# Patient Record
Sex: Female | Born: 1960
Health system: Southern US, Community
[De-identification: ages and names within clinical notes are randomized; demographics above are authoritative.]

## PROBLEM LIST (undated history)

## (undated) ENCOUNTER — Emergency Department (HOSPITAL_BASED_OUTPATIENT_CLINIC_OR_DEPARTMENT_OTHER): Payer: Self-pay | Source: Home / Self Care

## (undated) DIAGNOSIS — J302 Other seasonal allergic rhinitis: Secondary | ICD-10-CM

## (undated) DIAGNOSIS — Z9049 Acquired absence of other specified parts of digestive tract: Secondary | ICD-10-CM

## (undated) DIAGNOSIS — R002 Palpitations: Secondary | ICD-10-CM

## (undated) DIAGNOSIS — M1990 Unspecified osteoarthritis, unspecified site: Secondary | ICD-10-CM

## (undated) DIAGNOSIS — J329 Chronic sinusitis, unspecified: Secondary | ICD-10-CM

## (undated) DIAGNOSIS — T7840XA Allergy, unspecified, initial encounter: Secondary | ICD-10-CM

## (undated) DIAGNOSIS — I10 Essential (primary) hypertension: Secondary | ICD-10-CM

## (undated) DIAGNOSIS — K219 Gastro-esophageal reflux disease without esophagitis: Secondary | ICD-10-CM

## (undated) DIAGNOSIS — G4730 Sleep apnea, unspecified: Secondary | ICD-10-CM

## (undated) DIAGNOSIS — G250 Essential tremor: Secondary | ICD-10-CM

## (undated) DIAGNOSIS — G629 Polyneuropathy, unspecified: Secondary | ICD-10-CM

## (undated) HISTORY — DX: Other seasonal allergic rhinitis: J30.2

## (undated) HISTORY — DX: Chronic sinusitis, unspecified: J32.9

## (undated) HISTORY — DX: Acquired absence of other specified parts of digestive tract: Z90.49

## (undated) HISTORY — DX: Unspecified osteoarthritis, unspecified site: M19.90

## (undated) HISTORY — DX: Polyneuropathy, unspecified: G62.9

## (undated) HISTORY — DX: Allergy, unspecified, initial encounter: T78.40XA

## (undated) HISTORY — DX: Sleep apnea, unspecified: G47.30

## (undated) HISTORY — DX: Essential tremor: G25.0

## (undated) HISTORY — DX: Palpitations: R00.2

## (undated) HISTORY — PX: HEEL SPUR SURGERY: SHX665

## (undated) HISTORY — PX: OTHER SURGICAL HISTORY: SHX169

---

## 2001-04-01 ENCOUNTER — Other Ambulatory Visit: Admission: RE | Admit: 2001-04-01 | Discharge: 2001-04-01 | Payer: Self-pay | Admitting: Obstetrics and Gynecology

## 2002-05-12 ENCOUNTER — Other Ambulatory Visit: Admission: RE | Admit: 2002-05-12 | Discharge: 2002-05-12 | Payer: Self-pay | Admitting: Obstetrics and Gynecology

## 2003-05-26 HISTORY — PX: CARPAL TUNNEL RELEASE: SHX101

## 2003-05-30 ENCOUNTER — Other Ambulatory Visit: Admission: RE | Admit: 2003-05-30 | Discharge: 2003-05-30 | Payer: Self-pay | Admitting: Obstetrics and Gynecology

## 2004-08-21 ENCOUNTER — Other Ambulatory Visit: Admission: RE | Admit: 2004-08-21 | Discharge: 2004-08-21 | Payer: Self-pay | Admitting: Obstetrics and Gynecology

## 2004-10-23 ENCOUNTER — Encounter: Admission: RE | Admit: 2004-10-23 | Discharge: 2004-10-23 | Payer: Self-pay | Admitting: Obstetrics and Gynecology

## 2006-05-25 HISTORY — PX: CHOLECYSTECTOMY: SHX55

## 2010-06-09 ENCOUNTER — Emergency Department (HOSPITAL_BASED_OUTPATIENT_CLINIC_OR_DEPARTMENT_OTHER)
Admission: EM | Admit: 2010-06-09 | Discharge: 2010-06-10 | Payer: Self-pay | Source: Home / Self Care | Admitting: Emergency Medicine

## 2010-06-15 ENCOUNTER — Encounter: Payer: Self-pay | Admitting: Obstetrics and Gynecology

## 2011-06-08 DIAGNOSIS — G629 Polyneuropathy, unspecified: Secondary | ICD-10-CM | POA: Insufficient documentation

## 2011-06-08 DIAGNOSIS — Z9049 Acquired absence of other specified parts of digestive tract: Secondary | ICD-10-CM | POA: Insufficient documentation

## 2012-11-10 ENCOUNTER — Encounter: Payer: Self-pay | Admitting: Internal Medicine

## 2012-12-28 ENCOUNTER — Emergency Department (HOSPITAL_BASED_OUTPATIENT_CLINIC_OR_DEPARTMENT_OTHER): Payer: Managed Care, Other (non HMO)

## 2012-12-28 ENCOUNTER — Encounter (HOSPITAL_BASED_OUTPATIENT_CLINIC_OR_DEPARTMENT_OTHER): Payer: Self-pay | Admitting: *Deleted

## 2012-12-28 ENCOUNTER — Emergency Department (HOSPITAL_BASED_OUTPATIENT_CLINIC_OR_DEPARTMENT_OTHER)
Admission: EM | Admit: 2012-12-28 | Discharge: 2012-12-28 | Disposition: A | Discharge status: Home / Self Care | Payer: Managed Care, Other (non HMO) | Attending: Emergency Medicine | Admitting: Emergency Medicine

## 2012-12-28 DIAGNOSIS — Z9089 Acquired absence of other organs: Secondary | ICD-10-CM | POA: Insufficient documentation

## 2012-12-28 DIAGNOSIS — K5289 Other specified noninfective gastroenteritis and colitis: Secondary | ICD-10-CM | POA: Insufficient documentation

## 2012-12-28 DIAGNOSIS — Z79899 Other long term (current) drug therapy: Secondary | ICD-10-CM | POA: Insufficient documentation

## 2012-12-28 DIAGNOSIS — R110 Nausea: Secondary | ICD-10-CM | POA: Insufficient documentation

## 2012-12-28 DIAGNOSIS — R630 Anorexia: Secondary | ICD-10-CM | POA: Insufficient documentation

## 2012-12-28 DIAGNOSIS — K529 Noninfective gastroenteritis and colitis, unspecified: Secondary | ICD-10-CM

## 2012-12-28 DIAGNOSIS — I10 Essential (primary) hypertension: Secondary | ICD-10-CM | POA: Insufficient documentation

## 2012-12-28 HISTORY — DX: Essential (primary) hypertension: I10

## 2012-12-28 LAB — URINALYSIS, ROUTINE W REFLEX MICROSCOPIC
Glucose, UA: NEGATIVE mg/dL
Hgb urine dipstick: NEGATIVE
Leukocytes, UA: NEGATIVE
Nitrite: NEGATIVE
Protein, ur: NEGATIVE mg/dL
Specific Gravity, Urine: 1.016 (ref 1.005–1.030)
pH: 7 (ref 5.0–8.0)

## 2012-12-28 LAB — CBC WITH DIFFERENTIAL/PLATELET
Basophils Relative: 0 % (ref 0–1)
Eosinophils Absolute: 0.2 10*3/uL (ref 0.0–0.7)
HCT: 37.4 % (ref 36.0–46.0)
MCV: 94.7 fL (ref 78.0–100.0)
Neutro Abs: 8.5 10*3/uL — ABNORMAL HIGH (ref 1.7–7.7)
Platelets: 261 10*3/uL (ref 150–400)
RBC: 3.95 MIL/uL (ref 3.87–5.11)
RDW: 12.9 % (ref 11.5–15.5)
WBC: 12.7 10*3/uL — ABNORMAL HIGH (ref 4.0–10.5)

## 2012-12-28 LAB — COMPREHENSIVE METABOLIC PANEL
Albumin: 3.7 g/dL (ref 3.5–5.2)
Alkaline Phosphatase: 93 U/L (ref 39–117)
BUN: 23 mg/dL (ref 6–23)
CO2: 26 mEq/L (ref 19–32)
Chloride: 99 mEq/L (ref 96–112)
GFR calc Af Amer: 60 mL/min — ABNORMAL LOW (ref >90)
GFR calc non Af Amer: 52 mL/min — ABNORMAL LOW (ref >90)
Glucose, Bld: 86 mg/dL (ref 70–99)
Potassium: 3.9 mEq/L (ref 3.5–5.1)
Sodium: 136 mEq/L (ref 135–145)
Total Bilirubin: 0.4 mg/dL (ref 0.3–1.2)

## 2012-12-28 MED ORDER — CIPROFLOXACIN HCL 500 MG PO TABS
500.0000 mg | ORAL_TABLET | Freq: Once | ORAL | Status: AC
  Administered 2012-12-28: 500 mg via ORAL
  Filled 2012-12-28: qty 1

## 2012-12-28 MED ORDER — METRONIDAZOLE 500 MG PO TABS
500.0000 mg | ORAL_TABLET | Freq: Once | ORAL | Status: AC
  Administered 2012-12-28: 500 mg via ORAL
  Filled 2012-12-28: qty 1

## 2012-12-28 MED ORDER — HYDROCODONE-ACETAMINOPHEN 5-325 MG PO TABS: 1.0000 | ORAL_TABLET | Freq: Four times a day (QID) | ORAL | Status: DC | PRN

## 2012-12-28 MED ORDER — ONDANSETRON HCL 4 MG/2ML IJ SOLN
4.0000 mg | Freq: Once | INTRAMUSCULAR | Status: AC
  Administered 2012-12-28: 4 mg via INTRAVENOUS
  Filled 2012-12-28: qty 2

## 2012-12-28 MED ORDER — METRONIDAZOLE 500 MG PO TABS: 500.0000 mg | ORAL_TABLET | Freq: Two times a day (BID) | ORAL | Status: DC

## 2012-12-28 MED ORDER — IOHEXOL 300 MG/ML  SOLN
100.0000 mL | Freq: Once | INTRAMUSCULAR | Status: AC | PRN
  Administered 2012-12-28: 100 mL via INTRAVENOUS

## 2012-12-28 MED ORDER — IOHEXOL 300 MG/ML  SOLN
50.0000 mL | Freq: Once | INTRAMUSCULAR | Status: AC | PRN
  Administered 2012-12-28: 50 mL via ORAL

## 2012-12-28 MED ORDER — MORPHINE SULFATE 4 MG/ML IJ SOLN
4.0000 mg | Freq: Once | INTRAMUSCULAR | Status: AC
  Administered 2012-12-28: 4 mg via INTRAVENOUS
  Filled 2012-12-28: qty 1

## 2012-12-28 MED ORDER — CIPROFLOXACIN HCL 500 MG PO TABS: 500.0000 mg | ORAL_TABLET | Freq: Two times a day (BID) | ORAL | Status: DC

## 2012-12-28 NOTE — ED Provider Notes (Signed)
CSN: [id number]     Arrival date & time 12/28/12  1620 History     First MD Initiated Contact with Patient 12/28/12 1655     Chief Complaint  Patient presents with  . Abdominal Pain   (Consider location/radiation/quality/duration/timing/severity/associated sxs/prior Treatment) Patient is a 52 y.o. female presenting with abdominal pain. The history is provided by the patient.  Abdominal Pain Pain location:  RLQ Pain quality: aching and sharp   Pain radiates to:  Does not radiate Pain severity:  Moderate Onset quality:  Sudden Timing:  Intermittent Progression:  Waxing and waning Chronicity:  New Context: not eating and not recent travel   Relieved by:  Nothing Worsened by:  Movement Ineffective treatments:  None tried Associated symptoms: anorexia and nausea   Associated symptoms: no chills, no diarrhea, no dysuria, no fever, no hematuria, no vaginal bleeding, no vaginal discharge and no vomiting     Past Medical History  Diagnosis Date  . Hypertension    Past Surgical History  Procedure Laterality Date  . Carpal tunnel release    . Cholecystectomy     No family history on file. History  Substance Use Topics  . Smoking status: Never Smoker   . Smokeless tobacco: Not on file  . Alcohol Use: No   OB History   Grav Para Term Preterm Abortions TAB SAB Ect Mult Living                 Review of Systems  Constitutional: Negative for fever and chills.  Gastrointestinal: Positive for nausea, abdominal pain and anorexia. Negative for vomiting and diarrhea.  Genitourinary: Negative for dysuria, hematuria, vaginal bleeding and vaginal discharge.  All other systems reviewed and are negative.    Allergies  Mobic and Niacin and related  Home Medications   Current Outpatient Rx  Name  Route  Sig  Dispense  Refill  . lisinopril (PRINIVIL,ZESTRIL) 10 MG tablet   Oral   Take 10 mg by mouth daily.         . phentermine 15 MG capsule   Oral   Take 15 mg by mouth  every morning.          BP 129/77  Pulse 97  Temp(Src) 97.9 F (36.6 C) (Oral)  Resp 18  Wt 293 lb (132.904 kg)  SpO2 99% Physical Exam  Nursing note and vitals reviewed. Constitutional: She is oriented to person, place, and time. She appears well-developed and well-nourished. No distress.  HENT:  Head: Normocephalic and atraumatic.  Eyes: EOM are normal. Pupils are equal, round, and reactive to light.  Neck: Normal range of motion. Neck supple.  Cardiovascular: Normal rate and regular rhythm.  Exam reveals no friction rub.   No murmur heard. Pulmonary/Chest: Effort normal and breath sounds normal. No respiratory distress. She has no wheezes. She has no rales.  Abdominal: Soft. She exhibits no distension. There is tenderness (RLQ, mild periumbilical). There is guarding (RLQ). There is no rebound.  Musculoskeletal: Normal range of motion. She exhibits no edema.  Neurological: She is alert and oriented to person, place, and time.  Skin: She is not diaphoretic.    ED Course   Procedures (including critical care time)  Labs Reviewed  CBC WITH DIFFERENTIAL - Abnormal; Notable for the following:    WBC 12.7 (*)    Neutro Abs 8.5 (*)    Monocytes Absolute 1.2 (*)    All other components within normal limits  URINALYSIS, ROUTINE W REFLEX MICROSCOPIC  COMPREHENSIVE METABOLIC PANEL  Ct Abdomen Pelvis W Contrast  12/28/2012   *RADIOLOGY REPORT*  Clinical Data: Right lower quadrant pain, nausea  CT ABDOMEN AND PELVIS WITH CONTRAST  Technique:  Multidetector CT imaging of the abdomen and pelvis was performed following the standard protocol during bolus administration of intravenous contrast.  Contrast: 50mL OMNIPAQUE IOHEXOL 300 MG/ML  SOLN, OMNIPAQUE IOHEXOL 300 MG/ML  SOLN  Comparison: None.  Findings: Lung bases clear.  Normal heart size.  No pericardial or pleural effusion.  Abdomen:  Prior cholecystectomy noted.  Liver, biliary system, pancreas, spleen, adrenal glands, and  kidneys are within normal limits for age and demonstrate no acute process.  Negative for bowel obstruction, dilatation, ileus, or free air.  No abdominal free fluid, fluid collection, abscess, hemorrhage, adenopathy.  In the right mid abdomen, there is a focal area of circumferential bowel wall thickening of the right colon with adjacent lateral pericolonic stranding / edema, suspect mild focal colitis.  It would be difficult to exclude a colonic mural lesion given this appearance.  Inferior to this, the terminal ileum and appendix are normal.  Pelvis:  Bladder, uterus and adnexa are unremarkable.  No pelvic free fluid, fluid collection, hemorrhage, abscess, adenopathy, inguinal abnormality, or significant hernia.  No acute distal bowel process.  No acute osseous finding.  IMPRESSION: Focal right colonic wall thickening with adjacent pericolonic inflammatory stranding / edema compatible with mild right colitis. Difficult to completely exclude underlying mural lesion.  Recommend follow-up non emergent colonoscopy in this age group.  Prior cholecystectomy.  Normal appendix.   Original Report Authenticated By: Judie Petit. Shick, M.D.   1. Colitis     MDM   52yo female presents with RLQ pain. Began today. Waxing and waning. Sharp. No previous abdominal pain like this. Associated nausea, dry heaving. No vomiting or diarrhea.  On exam, RLQ pain with some guarding. Mild periumbilical pain. Concern for possible appendicitis. Will CT. CT negative for appendicitis, however she does have a focal right colitis. Cannot rule out a transmural lesion. Since she is focal colitis,  I will treat with Cipro Flagyl. patient given Cipro Flagyl and pain medicine. Instructed to followup with her primary care physician. Stable for discharge.   Films reviewed by me.   Dagmar Hait, MD 12/28/12 [phone]

## 2012-12-28 NOTE — ED Notes (Signed)
RLQ pain since this morning.

## 2012-12-29 LAB — GC/CHLAMYDIA PROBE AMP
CT Probe RNA: NEGATIVE
GC Probe RNA: NEGATIVE

## 2013-01-27 ENCOUNTER — Ambulatory Visit (AMBULATORY_SURGERY_CENTER): Payer: Self-pay | Admitting: *Deleted

## 2013-01-27 VITALS — Ht 67.0 in | Wt 290.6 lb

## 2013-01-27 DIAGNOSIS — Z1211 Encounter for screening for malignant neoplasm of colon: Secondary | ICD-10-CM

## 2013-01-27 NOTE — Progress Notes (Signed)
Denies allergies to eggs or soy products. Denies complications with sedation or anesthesia. 

## 2013-01-30 ENCOUNTER — Encounter: Payer: Self-pay | Admitting: Internal Medicine

## 2013-02-08 ENCOUNTER — Telehealth: Payer: Self-pay | Admitting: Internal Medicine

## 2013-02-08 NOTE — Telephone Encounter (Signed)
Spoke with patient, she states she is being treated for strep. Is taking amoxicillin. Explained she could take antibiotics the day of procedure. She understands.

## 2013-02-10 ENCOUNTER — Encounter: Payer: Self-pay | Admitting: Internal Medicine

## 2013-02-10 ENCOUNTER — Ambulatory Visit (AMBULATORY_SURGERY_CENTER): Payer: Managed Care, Other (non HMO) | Admitting: Internal Medicine

## 2013-02-10 VITALS — BP 114/68 | HR 76 | Temp 96.9°F | Resp 18 | Ht 67.0 in | Wt 290.0 lb

## 2013-02-10 DIAGNOSIS — Z1211 Encounter for screening for malignant neoplasm of colon: Secondary | ICD-10-CM

## 2013-02-10 DIAGNOSIS — K633 Ulcer of intestine: Secondary | ICD-10-CM

## 2013-02-10 DIAGNOSIS — R933 Abnormal findings on diagnostic imaging of other parts of digestive tract: Secondary | ICD-10-CM

## 2013-02-10 MED ORDER — SODIUM CHLORIDE 0.9 % IV SOLN: 500.0000 mL | INTRAVENOUS | Status: DC

## 2013-02-10 NOTE — Op Note (Addendum)
[facility] [address] [place], [address]   COLONOSCOPY PROCEDURE REPORT  PATIENT: Ashley [name], Ashley [name]  MR#: [id number] BIRTHDATE: [birth year] , 52  yrs. old GENDER: Female ENDOSCOPIST: Iva Boop, MD, [facility] REFERRED BY:   Betsey Holiday, MD PROCEDURE DATE:  02/10/2013 PROCEDURE:   Colonoscopy with biopsy First Screening Colonoscopy - Avg.  risk and is 50 yrs.  old or older Yes.  Prior Negative Screening - Now for repeat screening. N/A  History of Adenoma - Now for follow-up colonoscopy & has been > or = to 3 yrs.  N/A  Polyps Removed Today? No.  Recommend repeat exam, <10 yrs? No. ASA CLASS:   Class II INDICATIONS:average risk screening and first colonoscopy. MEDICATIONS: propofol (Diprivan) 200mg  IV, MAC sedation, administered by CRNA, and These medications were titrated to patient response per physician's verbal order  DESCRIPTION OF PROCEDURE:   After the risks benefits and alternatives of the procedure were thoroughly explained, informed consent was obtained.  A digital rectal exam revealed no abnormalities of the rectum.   The LB [id number]  endoscope was introduced through the anus and advanced to the terminal ileum which was intubated for a short distance. No adverse events experienced.   The quality of the prep was excellent using Suprep The instrument was then slowly withdrawn as the colon was fully examined.      COLON FINDINGS: A medium sized ulcer was found in the ascending colon.  Multiple biopsies were performed using cold forceps.   The colon mucosa was otherwise normal.   The mucosa appeared normal in the terminal ileum.  Retroflexed views revealed no abnormalities. The time to cecum=2 minutes 12 seconds.  Withdrawal time=12 minutes 00 seconds.  The scope was withdrawn and the procedure completed. COMPLICATIONS: There were no complications.  ENDOSCOPIC IMPRESSION: 1.   Ulcer in the ascending colon; multiple biopsies were  performed using cold forceps 2.   The colon mucosa was otherwise normal - excellent prep 3.   Normal mucosa in the terminal ileum  RECOMMENDATIONS: 1.  Timing of repeat colonoscopy will be determined by pathology findings. 2.  Routine hemoccults no longer indicated.   eSigned:  Iva Boop, MD, [facility] 02/10/2013 10:06 AM Revised: 02/10/2013 10:06 AM  cc: Betsey Holiday, MD and The Patient

## 2013-02-10 NOTE — Patient Instructions (Addendum)
There was one area that looked like a healing ulcer - I took biopsies and will let you know. Otherwise ok.  I appreciate the opportunity to care for you. Iva Boop, MD, FACG   YOU HAD AN ENDOSCOPIC PROCEDURE TODAY AT THE [facility]: Refer to the procedure report that was given to you for any specific questions about what was found during the examination.  If the procedure report does not answer your questions, please call your gastroenterologist to clarify.  If you requested that your care partner not be given the details of your procedure findings, then the procedure report has been included in a sealed envelope for you to review at your convenience later.  YOU SHOULD EXPECT: Some feelings of bloating in the abdomen. Passage of more gas than usual.  Walking can help get rid of the air that was put into your GI tract during the procedure and reduce the bloating. If you had a lower endoscopy (such as a colonoscopy or flexible sigmoidoscopy) you may notice spotting of blood in your stool or on the toilet paper. If you underwent a bowel prep for your procedure, then you may not have a normal bowel movement for a few days.  DIET: Your first meal following the procedure should be a light meal and then it is ok to progress to your normal diet.  A half-sandwich or bowl of soup is an example of a good first meal.  Heavy or fried foods are harder to digest and may make you feel nauseous or bloated.  Likewise meals heavy in dairy and vegetables can cause extra gas to form and this can also increase the bloating.  Drink plenty of fluids but you should avoid alcoholic beverages for 24 hours.  ACTIVITY: Your care partner should take you home directly after the procedure.  You should plan to take it easy, moving slowly for the rest of the day.  You can resume normal activity the day after the procedure however you should NOT DRIVE or use heavy machinery for 24 hours (because of the sedation  medicines used during the test).    SYMPTOMS TO REPORT IMMEDIATELY: A gastroenterologist can be reached at any hour.  During normal business hours, 8:30 AM to 5:00 PM Monday through Friday, call [phone].  After hours and on weekends, please call the GI answering service at [phone] who will take a message and have the physician on call contact you.   Following lower endoscopy (colonoscopy or flexible sigmoidoscopy):  Excessive amounts of blood in the stool  Significant tenderness or worsening of abdominal pains  Swelling of the abdomen that is new, acute  Fever of 100F or higher    FOLLOW UP: If any biopsies were taken you will be contacted by phone or by letter within the next 1-3 weeks.  Call your gastroenterologist if you have not heard about the biopsies in 3 weeks.  Our staff will call the home number listed on your records the next business day following your procedure to check on you and address any questions or concerns that you may have at that time regarding the information given to you following your procedure. This is a courtesy call and so if there is no answer at the home number and we have not heard from you through the emergency physician on call, we will assume that you have returned to your regular daily activities without incident.  SIGNATURES/CONFIDENTIALITY: You and/or your care partner have signed paperwork which  will be entered into your electronic medical record.  These signatures attest to the fact that that the information above on your After Visit Summary has been reviewed and is understood.  Full responsibility of the confidentiality of this discharge information lies with you and/or your care-partner.

## 2013-02-10 NOTE — Progress Notes (Signed)
Patient did not have preoperative order for IV antibiotic SSI prophylaxis. (G8918)  Patient did not experience any of the following events: a burn prior to discharge; a fall within the facility; wrong site/side/patient/procedure/implant event; or a hospital transfer or hospital admission upon discharge from the facility. (G8907)  

## 2013-02-10 NOTE — Progress Notes (Signed)
Procedure ends, to recovery, report given and VSS. 

## 2013-02-10 NOTE — Progress Notes (Signed)
Called to room to assist during endoscopic procedure.  Patient ID and intended procedure confirmed with present staff. Received instructions for my participation in the procedure from the performing physician.  

## 2013-02-13 ENCOUNTER — Telehealth: Payer: Self-pay | Admitting: *Deleted

## 2013-02-13 NOTE — Telephone Encounter (Signed)
  Follow up Call-  Call back number 02/10/2013  Post procedure Call Back phone  # [phone]  Permission to leave phone message Yes     Patient questions:  Left message to call us if necessary.

## 2013-02-17 ENCOUNTER — Encounter: Payer: Self-pay | Admitting: Internal Medicine

## 2013-02-17 NOTE — Progress Notes (Signed)
Quick Note:  Ulcer - inflammation Repeat colon 2024 ______

## 2013-03-22 ENCOUNTER — Encounter: Payer: Self-pay | Admitting: Neurology

## 2013-03-23 ENCOUNTER — Encounter: Payer: Self-pay | Admitting: Neurology

## 2013-03-23 ENCOUNTER — Ambulatory Visit (INDEPENDENT_AMBULATORY_CARE_PROVIDER_SITE_OTHER): Payer: Managed Care, Other (non HMO) | Admitting: Neurology

## 2013-03-23 VITALS — BP 108/65 | HR 58 | Ht 68.0 in | Wt 283.0 lb

## 2013-03-23 DIAGNOSIS — G250 Essential tremor: Secondary | ICD-10-CM

## 2013-03-23 NOTE — Progress Notes (Signed)
[facility]    Provider:  Dr Hosie Poisson Referring Provider: Marguerita Merles Primary Care Physician:  No primary provider on file.  CC: Tremor  HPI:  Ashley [name] is a 52 y.o. female here as a referral from Dr. Jacqulyn Bath for possible essential tremor.   Tremor started years ago, saw neurologist in [facility] and then moved to this area. Was told it was essential tremor at that time, suggested she try a medication which patient reports with a seizure medication, patient refused at that time. Started in left hand, now in both but worse in left. Notes it the most when holding things, more of a postural/action tremor. Worse with stress. Has minimal tremor at rest. More of a nuisance. Not affecting daily life the No bradykinesias, no gait instability, no muscle cramping. No difficulty sleeping, some talking in her sleep. Has limited sense of smell. No change in handwriting. No known thyroid abnormalities, has been checked at PCP. Started Inderal 60mg  ER, this week. Tolerating well, has not noted any benefit. No noted effect with EtOH but drinks infrequently.   No family history of tremor a neurodegenerative process  Review of Systems: Out of a complete 14 system review, the patient complains of only the following symptoms, and all other reviewed systems are negative. Other for tremor swelling in legs fatigue easy  History   Social History  . Marital Status: Married    Spouse Name: Colon Branch     Number of Children: 3  . Years of Education: 12+   Occupational History  .     Social History Main Topics  . Smoking status: Never Smoker   . Smokeless tobacco: Never Used  . Alcohol Use: No  . Drug Use: No  . Sexual Activity: Not on file   Other Topics Concern  . Not on file   Social History Narrative   Patient is Married to [place].    Patient works full-time.   Patient has 3 children.    Patient has some college.           Family History  Problem Relation Age of  Onset  . Colon cancer Neg Hx   . Esophageal cancer Neg Hx   . Rectal cancer Neg Hx   . Stomach cancer Neg Hx     Past Medical History  Diagnosis Date  . Hypertension   . Seasonal allergies   . Essential tremor   . Palpitations   . History of cholecystectomy   . Neuropathy     Past Surgical History  Procedure Laterality Date  . Carpal tunnel release Right 2005    left 2011  . Cesarean section  1989  . Heel spur surgery Left   . Cholecystectomy  2008  . History of neuroplasty decompression median nerve at carpal tunnel      Current Outpatient Prescriptions  Medication Sig Dispense Refill  . CALCIUM CARBONATE-VITAMIN D PO Take by mouth daily.      . hydrOXYzine (ATARAX/VISTARIL) 50 MG tablet Take 50 mg by mouth 3 (three) times daily as needed for itching.      Marland Kitchen lisinopril-hydrochlorothiazide (PRINZIDE,ZESTORETIC) 20-12.5 MG per tablet Take 1 tablet by mouth daily.      . Multiple Vitamins-Minerals (MULTIPLE VITAMINS/WOMENS) tablet Take 1 tablet by mouth daily.      . phentermine 15 MG capsule Take 30 mg by mouth every morning.       . propranolol ER (INDERAL LA) 60 MG 24 hr capsule Take 60 mg  by mouth daily.       No current facility-administered medications for this visit.    Allergies as of 03/23/2013 - Review Complete 03/23/2013  Allergen Reaction Noted  . Chocolate  03/22/2013  . Mobic [meloxicam]  12/28/2012  . Neomycin  03/22/2013  . Neosporin [neomycin-bacitracin zn-polymyx]  03/22/2013  . Niacin and related  12/28/2012  . Strawberry  03/22/2013  . Erythromycin Rash 01/27/2013    Vitals: BP 108/65  Pulse 58  Ht 5\' 8"  (1.727 m)  Wt 283 lb (128.368 kg)  BMI 43.04 kg/m2 Last Weight:  Wt Readings from Last 1 Encounters:  03/23/13 283 lb (128.368 kg)   Last Height:   Ht Readings from Last 1 Encounters:  03/23/13 5\' 8"  (1.727 m)     Physical exam: Exam: Gen: NAD, conversant Eyes: anicteric sclerae, moist conjunctivae HENT: Atraumatic, oropharynx  clear Neck: Trachea midline; supple,  Lungs: CTA, no wheezing, rales, rhonic                          CV: RRR, no MRG Abdomen: Soft, non-tender;  Extremities: No peripheral edema  Skin: Normal temperature, no rash,  Psych: Appropriate affect, pleasant  Neuro: MS: AA&Ox3, appropriately interactive, normal affect   Speech: fluent w/o paraphasic error  Memory: good recent and remote recall  CN: PERRL, EOMI no nystagmus, no ptosis, sensation intact to LT V1-V3 bilat, face symmetric, no weakness, hearing grossly intact, palate elevates symmetrically, shoulder shrug 5/5 bilat,  tongue protrudes midline, no fasiculations noted.  Motor: normal bulk and tone Strength: 5/5  In all extremities  Coord: Noted postural action and mild intention tremor of bilateral hands left greater than right. No bradykinesia no muscle rigidity no cogwheeling   Reflexes: symmetrical, bilat downgoing toes  Sens: LT intact in all extremities  Gait: posture, stance, stride and arm-swing normal. Tandem gait intact. Able to walk on heels and toes. Romberg absent.   Assessment:  After physical and neurologic examination, review of laboratory studies, imaging, neurophysiology testing and pre-existing records, assessment will be reviewed on the problem list.  Plan:  Treatment plan and additional workup will be reviewed under Problem List.  52 year old woman presenting for initial evaluation of tremor, characterized by bilateral hand postural and action tremor. Physical exam otherwise unremarkable. Based on history and clinical symptoms is most consistent with a diagnosis of essential tremor. Patient currently on Inderal extended release 60 mg daily. Appears to be tolerated well, will give this medication time to take effect. Patient instructed to call back in one month if no symptomatic improvement, at that time would consider increasing dose as tolerated. If patient unable to tolerate higher dose would consider  switch to primidone. Followup in one year or as needed

## 2013-03-23 NOTE — Patient Instructions (Signed)
Overall you are doing fairly well but I do want to suggest a few things today:   Remember to drink plenty of fluid, eat healthy meals and do not skip any meals. Try to eat protein with a every meal and eat a healthy snack such as fruit or nuts in between meals. Try to keep a regular sleep-wake schedule and try to exercise daily, particularly in the form of walking, 20-30 minutes a day, if you can.   As far as your medications are concerned, I would like to suggest continuing on the Inderal ER 60mg  daily. Give this medication a few weeks to start working. If you are not noticing benefit in a month please call and we can adjust the medication.   I would like to see you back in 12 months, sooner if we need to. Please call us with any interim questions, concerns, problems, updates or refill requests.   My clinical assistant and will answer any of your questions and relay your messages to me and also relay most of my messages to you.   Our phone number is [phone]. We also have an after hours call service for urgent matters and there is a physician on-call for urgent questions. For any emergencies you know to call 911 or go to the nearest emergency room

## 2013-04-19 ENCOUNTER — Telehealth: Payer: Self-pay | Admitting: Neurology

## 2013-04-19 DIAGNOSIS — G250 Essential tremor: Secondary | ICD-10-CM

## 2013-04-19 MED ORDER — PROPRANOLOL HCL ER 80 MG PO CP24: 80.0000 mg | ORAL_CAPSULE | Freq: Every day | ORAL | Status: DC

## 2013-04-19 NOTE — Telephone Encounter (Signed)
As she discussed with Dr. Hosie Poisson during her clinic visit I will go ahead and increase her Inderal LA from 60 mg to 80 mg once daily. Please tell her that she can start taking the 80 mg strength in lieu of the 60 mg strength. Rx done and sent to pharmacy on file. Please advise her that she can have side effects including sedation, lethargy, low blood pressure, lightheadedness and low pulse rate. However, most likely, the medication increase is not big enough to cause her any sinister side effects. Please make patient aware that it can take a few days for the medication to kick in.

## 2013-04-19 NOTE — Telephone Encounter (Signed)
I called patient and reviewed Dr. Johny Sax' note. I let her know Rx was sent to [name]. Patient thanked me.

## 2013-04-19 NOTE — Telephone Encounter (Signed)
Spoke with patient and she said that tremor medicine prescribe by pcp is propranolol-ER,60mg , is not helping with tremors, would like something in it's place. She will be [phone] until 2:30 then afterwards please call on cell @ [phone].

## 2013-07-10 ENCOUNTER — Other Ambulatory Visit: Payer: Self-pay

## 2013-07-10 DIAGNOSIS — G250 Essential tremor: Secondary | ICD-10-CM

## 2013-07-10 MED ORDER — PROPRANOLOL HCL ER 80 MG PO CP24: 80.0000 mg | ORAL_CAPSULE | Freq: Every day | ORAL | Status: DC

## 2013-07-10 NOTE — Telephone Encounter (Signed)
Pharmacy requests 90 day Rx  

## 2013-09-16 ENCOUNTER — Encounter (HOSPITAL_BASED_OUTPATIENT_CLINIC_OR_DEPARTMENT_OTHER): Payer: Self-pay | Admitting: Emergency Medicine

## 2013-09-16 ENCOUNTER — Emergency Department (HOSPITAL_BASED_OUTPATIENT_CLINIC_OR_DEPARTMENT_OTHER)
Admission: EM | Admit: 2013-09-16 | Discharge: 2013-09-16 | Disposition: A | Discharge status: Home / Self Care | Payer: Managed Care, Other (non HMO) | Attending: Emergency Medicine | Admitting: Emergency Medicine

## 2013-09-16 DIAGNOSIS — I10 Essential (primary) hypertension: Secondary | ICD-10-CM | POA: Insufficient documentation

## 2013-09-16 DIAGNOSIS — Z8669 Personal history of other diseases of the nervous system and sense organs: Secondary | ICD-10-CM | POA: Insufficient documentation

## 2013-09-16 DIAGNOSIS — M79602 Pain in left arm: Secondary | ICD-10-CM

## 2013-09-16 DIAGNOSIS — Z79899 Other long term (current) drug therapy: Secondary | ICD-10-CM | POA: Insufficient documentation

## 2013-09-16 DIAGNOSIS — M79609 Pain in unspecified limb: Secondary | ICD-10-CM | POA: Insufficient documentation

## 2013-09-16 MED ORDER — PREDNISONE 20 MG PO TABS: 60.0000 mg | ORAL_TABLET | Freq: Every day | ORAL | Status: DC

## 2013-09-16 MED ORDER — OXYCODONE-ACETAMINOPHEN 5-325 MG PO TABS: 1.0000 | ORAL_TABLET | ORAL | Status: DC | PRN

## 2013-09-16 MED ORDER — OXYCODONE-ACETAMINOPHEN 5-325 MG PO TABS
1.0000 | ORAL_TABLET | Freq: Once | ORAL | Status: AC
  Administered 2013-09-16: 1 via ORAL
  Filled 2013-09-16: qty 1

## 2013-09-16 MED ORDER — PREDNISONE 50 MG PO TABS
60.0000 mg | ORAL_TABLET | Freq: Once | ORAL | Status: AC
  Administered 2013-09-16: 60 mg via ORAL
  Filled 2013-09-16 (×2): qty 1

## 2013-09-16 NOTE — ED Provider Notes (Signed)
Medical screening examination/treatment/procedure(s) were performed by non-physician practitioner and as supervising physician I was immediately available for consultation/collaboration.   EKG Interpretation None        Drewey Begue, MD 09/16/13 1941 

## 2013-09-16 NOTE — Discharge Instructions (Signed)
KEEP YOUR ORTHOPEDIC APPOINTMENT FOR 09/27/13. TAKE MEDICATIONS AS PRESCRIBED. RETURN HERE WITH ANY HIGH FEVER, SEVERE PAIN OR NEW CONCERN.

## 2013-09-16 NOTE — ED Provider Notes (Signed)
CSN: [id number]     Arrival date & time 09/16/13  1600 History   First MD Initiated Contact with Patient 09/16/13 1611     Chief Complaint  Patient presents with  . Arm Pain     (Consider location/radiation/quality/duration/timing/severity/associated sxs/prior Treatment) Patient is a 53 y.o. female presenting with arm pain. The history is provided by the patient. No language interpreter was used.  Arm Pain This is a new problem. The current episode started 1 to 4 weeks ago. The problem occurs constantly. Pertinent negatives include no chills, fever, neck pain or numbness. Associated symptoms comments: She complains of left arm pain that started 2 weeks ago at the elbow without known injury or strain. The pain progressed to involve the left shoulder and extend into the forearm. She denies swelling or redness. No fever. No neck pain..    Past Medical History  Diagnosis Date  . Hypertension   . Seasonal allergies   . Essential tremor   . Palpitations   . History of cholecystectomy   . Neuropathy    Past Surgical History  Procedure Laterality Date  . Carpal tunnel release Right 2005    left 2011  . Cesarean section  1989  . Heel spur surgery Left   . Cholecystectomy  2008  . History of neuroplasty decompression median nerve at carpal tunnel     Family History  Problem Relation Age of Onset  . Colon cancer Neg Hx   . Esophageal cancer Neg Hx   . Rectal cancer Neg Hx   . Stomach cancer Neg Hx    History  Substance Use Topics  . Smoking status: Never Smoker   . Smokeless tobacco: Never Used  . Alcohol Use: No   OB History   Grav Para Term Preterm Abortions TAB SAB Ect Mult Living                 Review of Systems  Constitutional: Negative for fever and chills.  Musculoskeletal: Negative for neck pain.       See HPI.  Skin: Negative.  Negative for color change and wound.  Neurological: Negative.  Negative for numbness.      Allergies  Chocolate; Mobic; Neomycin;  Neosporin; Niacin and related; Strawberry; and Erythromycin  Home Medications   Prior to Admission medications   Medication Sig Start Date End Date Taking? Authorizing Provider  CALCIUM CARBONATE-VITAMIN D PO Take by mouth daily.   Yes Historical Provider, MD  lisinopril-hydrochlorothiazide (PRINZIDE,ZESTORETIC) 20-12.5 MG per tablet Take 1 tablet by mouth daily.   Yes Historical Provider, MD  Multiple Vitamins-Minerals (MULTIPLE VITAMINS/WOMENS) tablet Take 1 tablet by mouth daily.   Yes Historical Provider, MD  propranolol ER (INDERAL LA) 80 MG 24 hr capsule Take 1 capsule (80 mg total) by mouth daily. 07/10/13  Yes Omelia BlackwaterPeter Justin Sumner, DO  hydrOXYzine (ATARAX/VISTARIL) 50 MG tablet Take 50 mg by mouth 3 (three) times daily as needed for itching.    Historical Provider, MD  phentermine 15 MG capsule Take 30 mg by mouth every morning.     Historical Provider, MD   BP 130/91  Pulse 66  Temp(Src) 97.8 F (36.6 C) (Oral)  Resp 24  Ht 5\' 7"  (1.702 m)  Wt 286 lb (129.729 kg)  BMI 44.78 kg/m2  SpO2 100% Physical Exam  Constitutional: She is oriented to person, place, and time. She appears well-developed and well-nourished.  Neck: Normal range of motion.  Cardiovascular: Intact distal pulses.   Pulmonary/Chest: Effort normal.  Musculoskeletal:  Left upper extremity is unremarkable in appearance without redness, swelling or deformity. There is no tenderness. No midline or paracervical neck tenderness. FROM of left upper extremity without strength deficit.  Neurological: She is alert and oriented to person, place, and time.  Skin: Skin is warm and dry.    ED Course  Procedures (including critical care time) Labs Review Labs Reviewed - No data to display  Imaging Review No results found.   EKG Interpretation None      MDM   Final diagnoses:  None    1. Left arm pain  DDx: muscular elbow pain, ?bursitis as she is morbidly obese and swelling may be difficult to see vs  cervical radiculopathy. Prednisone indicated. She has orthopedic appointment scheduled.     Arnoldo HookerShari A Dequavious Harshberger, PA-C 09/16/13 1705

## 2013-09-16 NOTE — ED Notes (Signed)
Reports left arm pain x 3 weeks, gradually worsening- states fell and hit elbow during snow

## 2013-09-16 NOTE — ED Notes (Signed)
Son is driving patient home.

## 2014-01-03 ENCOUNTER — Other Ambulatory Visit: Payer: Self-pay | Admitting: Neurology

## 2014-01-03 NOTE — Telephone Encounter (Signed)
Per phone note from 11/26

## 2014-01-08 ENCOUNTER — Encounter: Payer: Self-pay | Admitting: Neurology

## 2014-01-22 ENCOUNTER — Telehealth: Payer: Self-pay | Admitting: Neurology

## 2014-01-22 NOTE — Telephone Encounter (Signed)
Please let her know this can be related to irritation of her peripheral nerves. I would suggest initially checking some blood work. She can have this done now or wait until she follows up in October, it is up to her.

## 2014-01-22 NOTE — Telephone Encounter (Signed)
Left message that the doctor suggest having blood work initially which she can have done now or at her follow up appointment in October.  Asked her to return call if she would like it set up now.

## 2014-01-22 NOTE — Telephone Encounter (Signed)
Patient called to confirm her change of appointment for 01-21-14.   She also relayed that her feet are "falling asleep" and would like to know what can be done for that issue.  She is currently being seen for essential tremor.

## 2014-02-01 ENCOUNTER — Telehealth: Payer: Self-pay | Admitting: Neurology

## 2014-02-01 NOTE — Telephone Encounter (Signed)
Patient called back and stated she would have blood work drawn when comes in for October appointment.

## 2014-02-01 NOTE — Telephone Encounter (Signed)
Noted.  Patient can have blood work done when she comes for appointment, per Dr. Hosie Poisson.

## 2014-03-23 ENCOUNTER — Ambulatory Visit: Payer: Managed Care, Other (non HMO) | Admitting: Neurology

## 2014-03-30 ENCOUNTER — Ambulatory Visit: Payer: Managed Care, Other (non HMO) | Admitting: Neurology

## 2014-05-01 ENCOUNTER — Ambulatory Visit: Payer: Managed Care, Other (non HMO) | Admitting: Neurology

## 2014-05-12 ENCOUNTER — Other Ambulatory Visit: Payer: Self-pay | Admitting: Neurology

## 2014-05-14 NOTE — Telephone Encounter (Signed)
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  Has appt in Jan.  This is current dose per phone note on 04/19/13

## 2014-05-16 ENCOUNTER — Other Ambulatory Visit: Payer: Self-pay | Admitting: Neurology

## 2014-05-17 NOTE — Telephone Encounter (Signed)
Rx was sent on 12/21.  I called the pharmacy and spoke with Jonny RuizJohn.  He verified they have Rx form the 21st

## 2014-05-21 ENCOUNTER — Other Ambulatory Visit: Payer: Self-pay

## 2014-05-21 MED ORDER — PROPRANOLOL HCL ER 80 MG PO CP24: ORAL_CAPSULE | ORAL | Status: DC

## 2014-05-21 NOTE — Telephone Encounter (Signed)
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  Has appt scheduled.  Patient's ins requires 90 day Rx.

## 2014-06-06 ENCOUNTER — Ambulatory Visit: Payer: Managed Care, Other (non HMO) | Admitting: Neurology

## 2014-06-14 ENCOUNTER — Ambulatory Visit: Payer: Managed Care, Other (non HMO) | Admitting: Neurology

## 2014-07-02 ENCOUNTER — Ambulatory Visit (INDEPENDENT_AMBULATORY_CARE_PROVIDER_SITE_OTHER): Payer: 59 | Admitting: Neurology

## 2014-07-02 ENCOUNTER — Encounter: Payer: Self-pay | Admitting: Neurology

## 2014-07-02 VITALS — BP 138/80 | Ht 67.0 in | Wt 297.0 lb

## 2014-07-02 DIAGNOSIS — G609 Hereditary and idiopathic neuropathy, unspecified: Secondary | ICD-10-CM

## 2014-07-02 DIAGNOSIS — G5601 Carpal tunnel syndrome, right upper limb: Secondary | ICD-10-CM

## 2014-07-02 DIAGNOSIS — G5600 Carpal tunnel syndrome, unspecified upper limb: Secondary | ICD-10-CM | POA: Insufficient documentation

## 2014-07-02 DIAGNOSIS — G250 Essential tremor: Secondary | ICD-10-CM

## 2014-07-02 DIAGNOSIS — G5603 Carpal tunnel syndrome, bilateral upper limbs: Secondary | ICD-10-CM

## 2014-07-02 DIAGNOSIS — G5602 Carpal tunnel syndrome, left upper limb: Secondary | ICD-10-CM

## 2014-07-02 MED ORDER — GABAPENTIN 300 MG PO CAPS: 300.0000 mg | ORAL_CAPSULE | Freq: Every evening | ORAL | Status: DC | PRN

## 2014-07-02 MED ORDER — PROPRANOLOL HCL ER 80 MG PO CP24: ORAL_CAPSULE | ORAL | Status: DC

## 2014-07-02 NOTE — Progress Notes (Signed)
[facility]    Provider:  Dr Lucia Gaskins Referring Provider: No ref. provider found Primary Care Physician:  Loyce Dys, PA-C  CC:  Essential Tremor  HPI:  Ashley [name] is a 54 y.o. female here as a follow up for essential tremor. She is feeling better. Tremor improved on the inderal . Not bothering her, not interfering with any daily functions. She says her feet have been numb on occassion. She was sitting eating lunch and her right foot got numb to the ankles. Got better with stretching. Has happened 3-4x in the right leg and at one point was persistent for several months. Not worse at night. No low back pain. Some foot cramps at night. She has pain in digit 2 in the hands. Previous CTS.  Visit with Dr. Hosie Poisson 03/23/2013: Tremor started years ago, saw neurologist in [facility] and then moved to this area. Was told it was essential tremor at that time, suggested she try a medication which patient reports with a seizure medication, patient refused at that time. Started in left hand, now in both but worse in left. Notes it the most when holding things, more of a postural/action tremor. Worse with stress. Has minimal tremor at rest. More of a nuisance. Not affecting daily life the No bradykinesias, no gait instability, no muscle cramping. No difficulty sleeping, some talking in her sleep. Has limited sense of smell. No change in handwriting. No known thyroid abnormalities, has been checked at PCP. Started Inderal  ER, this week. Tolerating well, has not noted any benefit. No noted effect with EtOH but drinks infrequently.   No family history of tremor a neurodegenerative process  Review of Systems: Patient complains of symptoms per HPI as well as the following symptoms: leg swelling, numbness, tremors. Pertinent negatives per HPI. All others negative.   History   Social History  . Marital Status: Married    Spouse Name: Colon Branch     Number of Children: 3  . Years of  Education: 12+   Occupational History  .     Social History Main Topics  . Smoking status: Never Smoker   . Smokeless tobacco: Never Used  . Alcohol Use: No  . Drug Use: No  . Sexual Activity: Not on file   Other Topics Concern  . Not on file   Social History Narrative   Patient is Married to [place].    Patient works full-time.   Patient has 3 children.    Patient has some college.           Family History  Problem Relation Age of Onset  . Colon cancer Neg Hx   . Esophageal cancer Neg Hx   . Rectal cancer Neg Hx   . Stomach cancer Neg Hx     Past Medical History  Diagnosis Date  . Hypertension   . Seasonal allergies   . Essential tremor   . Palpitations   . History of cholecystectomy   . Neuropathy     Past Surgical History  Procedure Laterality Date  . Carpal tunnel release Right 2005    left 2011  . Cesarean section  1989  . Heel spur surgery Left   . Cholecystectomy  2008  . History of neuroplasty decompression median nerve at carpal tunnel      Current Outpatient Prescriptions  Medication Sig Dispense Refill  . CALCIUM CARBONATE-VITAMIN D PO Take by mouth daily.    . hydrOXYzine (ATARAX/VISTARIL) 50 MG tablet Take 50 mg by  mouth 3 (three) times daily as needed for itching.    Marland Kitchen lisinopril-hydrochlorothiazide (PRINZIDE,ZESTORETIC) 20-12.5 MG per tablet Take 1 tablet by mouth daily.    . Multiple Vitamins-Minerals (MULTIPLE VITAMINS/WOMENS) tablet Take 1 tablet by mouth daily.    Marland Kitchen oxyCODONE-acetaminophen (PERCOCET/ROXICET) 5-325 MG per tablet Take 1-2 tablets by mouth every 4 (four) hours as needed for severe pain. 15 tablet 0  . phentermine 15 MG capsule Take 30 mg by mouth every morning.     . predniSONE (DELTASONE) 20 MG tablet Take 3 tablets (60 mg total) by mouth daily. 9 tablet 0  . propranolol ER (INDERAL LA) 80 MG 24 hr capsule TAKE 1 CAPSULE (80 MG TOTAL) BY MOUTH DAILY. 90 capsule 0   No current facility-administered medications for this  visit.    Allergies as of 07/02/2014 - Review Complete 07/02/2014  Allergen Reaction Noted  . Chocolate  03/22/2013  . Mobic [meloxicam]  12/28/2012  . Neomycin  03/22/2013  . Neosporin [neomycin-bacitracin zn-polymyx]  03/22/2013  . Niacin and related  12/28/2012  . Strawberry  03/22/2013  . Erythromycin Rash 01/27/2013    Vitals: BP 138/80 mmHg  Ht  (1.702 m)  Wt 297 lb (134.718 kg)  BMI 46.51 kg/m2 Last Weight:  Wt Readings from Last 1 Encounters:  07/02/14 297 lb (134.718 kg)   Last Height:   Ht Readings from Last 1 Encounters:  07/02/14  (1.702 m)    Sensation intact to pp, prop,vibra Postural tremor left > right   Physical exam: Exam: Gen: NAD, conversant, well nourised, obese, well groomed    Eyes: Conjunctivae clear without exudates or hemorrhage  Neuro: Detailed Neurologic Exam  Speech:    Speech is normal; fluent and spontaneous with normal comprehension.  Cognition:    The patient is oriented to person, place, and time;     recent and remote memory intact;     language fluent;     normal attention, concentration,     fund of knowledge Cranial Nerves:    The pupils are equal, round, and reactive to light. Visual fields are full to finger confrontation. Extraocular movements are intact. Trigeminal sensation is intact and the muscles of mastication are normal. The face is symmetric. The palate elevates in the midline. Hearing intact. Voice is normal. Shoulder shrug is normal. The tongue has normal motion without fasciculations.   Coordination:    No dysmetria   Motor Observation:    Postural action and mild intention tremor right > left  Tone:    Normal muscle tone.    Posture:    Posture is normal.    Strength:    Strength is V/V in the upper and lower limbs.      Sensation: intact to pin prick, vibration and proprioception distally in the feet.     Reflex Exam:  DTR's:    Deep tendon reflexes achilles 2+ bilat  Toes:    The  toes are downgoing bilaterally.   Clonus:    Clonus is absent.   Assessment/Plan:  54 year old woman presenting for follow up evaluation of tremor, characterized by bilateral hand postural and action tremor. Physical exam otherwise unremarkable. Based on history and clinical symptoms is most consistent with a diagnosis of essential tremor. Patient currently on Inderal extended release 80 mg daily, will continue.  Today she also complains of numbness in the feet. Will screen for common causes of neuropathy. Also advised that she wear wrist braces at night and conservative measures for pain  and tingling in digits 1-3 of hands possibly from CTS.    Naomie DeanAntonia Ahern, MD  [facility] [address] [place], [place] [id number]  Phone [phone] Fax [phone]  A total of 30 minutes was spent face-to-face with this patient. Over half this time was spent on counseling patient on the CTS,Essential tremor and neuropathy diagnosis and different diagnostic and therapeutic options available.

## 2014-07-02 NOTE — Patient Instructions (Addendum)
Overall you are doing fairly well but I do want to suggest a few things today:   Remember to drink plenty of fluid, eat healthy meals and do not skip any meals. Try to eat protein with a every meal and eat a healthy snack such as fruit or nuts in between meals. Try to keep a regular sleep-wake schedule and try to exercise daily, particularly in the form of walking, 20-30 minutes a day, if you can.   As far as your medications are concerned, I would like to suggest: Labwork today, Neurontin 300mg  as needed at night for cramping.   I would like to see you back yearly, sooner if we need to. Please call us with any interim questions, concerns, problems, updates or refill requests.   Please also call us for any test results so we can go over those with you on the phone.    My clinical assistant and will answer any of your questions and relay your messages to me and also relay most of my messages to you.   Our phone number is [phone]. We also have an after hours call service for urgent matters and there is a physician on-call for urgent questions. For any emergencies you know to call 911 or go to the nearest emergency room

## 2014-07-04 ENCOUNTER — Telehealth: Payer: Self-pay | Admitting: *Deleted

## 2014-07-04 LAB — COMPREHENSIVE METABOLIC PANEL
ALK PHOS: 91 IU/L (ref 39–117)
ALT: 11 IU/L (ref 0–32)
AST: 16 IU/L (ref 0–40)
Albumin/Globulin Ratio: 1.6 (ref 1.1–2.5)
Albumin: 3.9 g/dL (ref 3.5–5.5)
BILIRUBIN TOTAL: 0.3 mg/dL (ref 0.0–1.2)
BUN/Creatinine Ratio: 15 (ref 9–23)
BUN: 17 mg/dL (ref 6–24)
CHLORIDE: 102 mmol/L (ref 97–108)
CO2: 24 mmol/L (ref 18–29)
Calcium: 9 mg/dL (ref 8.7–10.2)
Creatinine, Ser: 1.11 mg/dL — ABNORMAL HIGH (ref 0.57–1.00)
GFR calc Af Amer: 66 mL/min/{1.73_m2} (ref >59)
GFR calc non Af Amer: 57 mL/min/{1.73_m2} — ABNORMAL LOW (ref >59)
Globulin, Total: 2.4 g/dL (ref 1.5–4.5)
Glucose: 87 mg/dL (ref 65–99)
Potassium: 4.4 mmol/L (ref 3.5–5.2)
SODIUM: 140 mmol/L (ref 134–144)
Total Protein: 6.3 g/dL (ref 6.0–8.5)

## 2014-07-04 LAB — IFE AND PE, SERUM
ALPHA 1: 0.2 g/dL (ref 0.1–0.4)
ALPHA2 GLOB SERPL ELPH-MCNC: 0.8 g/dL (ref 0.4–1.2)
Albumin SerPl Elph-Mcnc: 3.5 g/dL (ref 3.2–5.6)
Albumin/Glob SerPl: 1.3 (ref 0.7–2.0)
B-GLOBULIN SERPL ELPH-MCNC: 0.9 g/dL (ref 0.6–1.3)
Gamma Glob SerPl Elph-Mcnc: 0.9 g/dL (ref 0.5–1.6)
Globulin, Total: 2.8 g/dL (ref 2.0–4.5)
IGM (IMMUNOGLOBULIN M), SRM: 188 mg/dL (ref 40–230)
IgA/Immunoglobulin A, Serum: 183 mg/dL (ref 91–414)
IgG (Immunoglobin G), Serum: 873 mg/dL (ref 700–1600)

## 2014-07-04 LAB — HIV ANTIBODY (ROUTINE TESTING W REFLEX): HIV Screen 4th Generation wRfx: NONREACTIVE

## 2014-07-04 LAB — HEPATITIS C ANTIBODY: Hep C Virus Ab: <0.1 s/co ratio (ref 0.0–0.9)

## 2014-07-04 LAB — B12 AND FOLATE PANEL
FOLATE: 10.6 ng/mL (ref >3.0)
Vitamin B-12: 327 pg/mL (ref 211–946)

## 2014-07-04 LAB — ANA W/REFLEX: ANA: NEGATIVE

## 2014-07-04 LAB — TSH: TSH: 1.75 u[IU]/mL (ref 0.450–4.500)

## 2014-07-04 LAB — RPR: RPR: NONREACTIVE

## 2014-07-04 NOTE — Telephone Encounter (Signed)
-----   Message from Anson FretAntonia B Ahern, MD sent at 07/03/2014  5:43 PM EST ----- Let patient know her labs were normal except slightly elevated creatinine, she can follow up with her pcp for this. Thanks.

## 2014-07-04 NOTE — Telephone Encounter (Signed)
Tried calling patient. Was not able to leave a message because the call ended.

## 2014-07-05 ENCOUNTER — Telehealth: Payer: Self-pay | Admitting: *Deleted

## 2014-07-05 NOTE — Telephone Encounter (Signed)
Patient is returning a call and wants to discuss her abnormal lab. Please call.

## 2014-07-05 NOTE — Telephone Encounter (Signed)
Patient returning call. Please call patient at [phone] that is her work number.

## 2014-07-05 NOTE — Telephone Encounter (Signed)
Left a voicemail for the patient regarding normal labs results except high creatinine level. Advised patient to follow up with her PCP per Dr. Lucia GaskinsAhern.

## 2014-07-06 NOTE — Telephone Encounter (Signed)
Spoke to patient. Creatinine is 1.1, just mildly elevated. A year ago it was 1.2 so it is actually improved. Not really concerning but did inform patient. No need to make a special appointment with pcp, she can follow up on it at her next physical.

## 2014-08-18 ENCOUNTER — Other Ambulatory Visit: Payer: Self-pay | Admitting: Neurology

## 2014-08-31 ENCOUNTER — Other Ambulatory Visit: Payer: Self-pay

## 2014-08-31 MED ORDER — GABAPENTIN 300 MG PO CAPS: 300.0000 mg | ORAL_CAPSULE | Freq: Every evening | ORAL | Status: DC | PRN

## 2014-08-31 NOTE — Telephone Encounter (Signed)
Pharmacy requested 90 day Rx  

## 2014-10-12 ENCOUNTER — Emergency Department (HOSPITAL_BASED_OUTPATIENT_CLINIC_OR_DEPARTMENT_OTHER)
Admission: EM | Admit: 2014-10-12 | Discharge: 2014-10-13 | Disposition: A | Discharge status: Home / Self Care | Payer: 59 | Attending: Emergency Medicine | Admitting: Emergency Medicine

## 2014-10-12 ENCOUNTER — Encounter (HOSPITAL_BASED_OUTPATIENT_CLINIC_OR_DEPARTMENT_OTHER): Payer: Self-pay | Admitting: Emergency Medicine

## 2014-10-12 DIAGNOSIS — Y92009 Unspecified place in unspecified non-institutional (private) residence as the place of occurrence of the external cause: Secondary | ICD-10-CM | POA: Insufficient documentation

## 2014-10-12 DIAGNOSIS — G629 Polyneuropathy, unspecified: Secondary | ICD-10-CM | POA: Diagnosis not present

## 2014-10-12 DIAGNOSIS — Z7951 Long term (current) use of inhaled steroids: Secondary | ICD-10-CM | POA: Diagnosis not present

## 2014-10-12 DIAGNOSIS — Y998 Other external cause status: Secondary | ICD-10-CM | POA: Diagnosis not present

## 2014-10-12 DIAGNOSIS — S99912A Unspecified injury of left ankle, initial encounter: Secondary | ICD-10-CM | POA: Diagnosis present

## 2014-10-12 DIAGNOSIS — W108XXA Fall (on) (from) other stairs and steps, initial encounter: Secondary | ICD-10-CM | POA: Diagnosis not present

## 2014-10-12 DIAGNOSIS — Z79899 Other long term (current) drug therapy: Secondary | ICD-10-CM | POA: Insufficient documentation

## 2014-10-12 DIAGNOSIS — Y9389 Activity, other specified: Secondary | ICD-10-CM | POA: Diagnosis not present

## 2014-10-12 DIAGNOSIS — I10 Essential (primary) hypertension: Secondary | ICD-10-CM | POA: Insufficient documentation

## 2014-10-12 DIAGNOSIS — Z9049 Acquired absence of other specified parts of digestive tract: Secondary | ICD-10-CM | POA: Insufficient documentation

## 2014-10-12 DIAGNOSIS — S8252XA Displaced fracture of medial malleolus of left tibia, initial encounter for closed fracture: Secondary | ICD-10-CM

## 2014-10-12 NOTE — ED Notes (Signed)
Fell down steps at home.  Twisted left ankle

## 2014-10-13 ENCOUNTER — Emergency Department (HOSPITAL_BASED_OUTPATIENT_CLINIC_OR_DEPARTMENT_OTHER): Payer: 59

## 2014-10-13 MED ORDER — HYDROCODONE-ACETAMINOPHEN 5-325 MG PO TABS: 1.0000 | ORAL_TABLET | Freq: Four times a day (QID) | ORAL | Status: DC | PRN

## 2014-10-13 MED ORDER — HYDROCODONE-ACETAMINOPHEN 5-325 MG PO TABS
2.0000 | ORAL_TABLET | Freq: Once | ORAL | Status: AC
  Administered 2014-10-13: 2 via ORAL
  Filled 2014-10-13: qty 2

## 2014-10-13 NOTE — ED Notes (Signed)
I fit and adjusted crutches for patient. I then completed crutch training and had patient ambulate from bed to hall and back to bed without assistance. Patient was nervous about crutch usage. I brought patient by wheelchair to car. I had to assist patient to car as she was not sure of herself or her own strength.

## 2014-10-13 NOTE — Discharge Instructions (Signed)
Ice for 20 minutes every 2 hours while awake for the next 2 days. Keep your leg elevated.  Use crutches and weightbearing as tolerated.  Follow-up with your primary Dr. if not improving in the next week.  Hydrocodone as prescribed as needed for pain.   Ankle Fracture A fracture is a break in a bone. The ankle joint is made up of three bones. These include the lower (distal)sections of your lower leg bones, called the tibia and fibula, along with a bone in your foot, called the talus. Depending on how bad the break is and if more than one ankle joint bone is broken, a cast or splint is used to protect and keep your injured bone from moving while it heals. Sometimes, surgery is required to help the fracture heal properly.  There are two general types of fractures:  Stable fracture. This includes a single fracture line through one bone, with no injury to ankle ligaments. A fracture of the talus that does not have any displacement (movement of the bone on either side of the fracture line) is also stable.  Unstable fracture. This includes more than one fracture line through one or more bones in the ankle joint. It also includes fractures that have displacement of the bone on either side of the fracture line. CAUSES  A direct blow to the ankle.   Quickly and severely twisting your ankle.  Trauma, such as a car accident or falling from a significant height. RISK FACTORS You may be at a higher risk of ankle fracture if:  You have certain medical conditions.  You are involved in high-impact sports.  You are involved in a high-impact car accident. SIGNS AND SYMPTOMS   Tender and swollen ankle.  Bruising around the injured ankle.  Pain on movement of the ankle.  Difficulty walking or putting weight on the ankle.  A cold foot below the site of the ankle injury. This can occur if the blood vessels passing through your injured ankle were also damaged.  Numbness in the foot below the  site of the ankle injury. DIAGNOSIS  An ankle fracture is usually diagnosed with a physical exam and X-rays. A CT scan may also be required for complex fractures. TREATMENT  Stable fractures are treated with a cast or splint and using crutches to avoid putting weight on your injured ankle. This is followed by an ankle strengthening program. Some patients require a special type of cast, depending on other medical problems they may have. Unstable fractures require surgery to ensure the bones heal properly. Your health care provider will tell you what type of fracture you have and the best treatment for your condition. HOME CARE INSTRUCTIONS   Review correct crutch use with your health care provider and use your crutches as directed. Safe use of crutches is extremely important. Misuse of crutches can cause you to fall or cause injury to nerves in your hands or armpits.  Do not put weight or pressure on the injured ankle until directed by your health care provider.  To lessen the swelling, keep the injured leg elevated while sitting or lying down.  Apply ice to the injured area:  Put ice in a plastic bag.  Place a towel between your cast and the bag.  Leave the ice on for 20 minutes, 2-3 times a day.  If you have a plaster or fiberglass cast:  Do not try to scratch the skin under the cast with any objects. This can increase your risk  of skin infection.  Check the skin around the cast every day. You may put lotion on any red or sore areas.  Keep your cast dry and clean.  If you have a plaster splint:  Wear the splint as directed.  You may loosen the elastic around the splint if your toes become numb, tingle, or turn cold or blue.  Do not put pressure on any part of your cast or splint; it may break. Rest your cast only on a pillow the first 24 hours until it is fully hardened.  Your cast or splint can be protected during bathing with a plastic bag sealed to your skin with medical  tape. Do not lower the cast or splint into water.  Take medicines as directed by your health care provider. Only take over-the-counter or prescription medicines for pain, discomfort, or fever as directed by your health care provider.  Do not drive a vehicle until your health care provider specifically tells you it is safe to do so.  If your health care provider has given you a follow-up appointment, it is very important to keep that appointment. Not keeping the appointment could result in a chronic or permanent injury, pain, and disability. If you have any problem keeping the appointment, call the facility for assistance. SEEK MEDICAL CARE IF: You develop increased swelling or discomfort. SEEK IMMEDIATE MEDICAL CARE IF:   Your cast gets damaged or breaks.  You have continued severe pain.  You develop new pain or swelling after the cast was put on.  Your skin or toenails below the injury turn blue or gray.  Your skin or toenails below the injury feel cold, numb, or have loss of sensitivity to touch.  There is a bad smell or pus draining from under the cast. MAKE SURE YOU:   Understand these instructions.  Will watch your condition.  Will get help right away if you are not doing well or get worse. Document Released: 05/08/2000 Document Revised: 05/16/2013 Document Reviewed: 12/08/2012 [facility] Patient Information 2015 [employer] [place]. This information is not intended to replace advice given to you by your health care provider. Make sure you discuss any questions you have with your health care provider.

## 2014-10-13 NOTE — ED Provider Notes (Signed)
CSN: [id number]     Arrival date & time 10/12/14  2341 History   First MD Initiated Contact with Patient 10/13/14 0020     Chief Complaint  Patient presents with  . Fall     (Consider location/radiation/quality/duration/timing/severity/associated sxs/prior Treatment) HPI Comments: Patient is a 54 year old female who presents with complaints of a left ankle injury. She apparently fell down several steps while at home and twisted her ankle.  Patient is a 54 y.o. female presenting with fall. The history is provided by the patient.  Fall This is a new problem. The current episode started 1 to 2 hours ago. The problem occurs constantly. The problem has not changed since onset.Pertinent negatives include no chest pain and no abdominal pain. The symptoms are aggravated by walking. Nothing relieves the symptoms. She has tried nothing for the symptoms. The treatment provided no relief.    Past Medical History  Diagnosis Date  . Hypertension   . Seasonal allergies   . Essential tremor   . Palpitations   . History of cholecystectomy   . Neuropathy    Past Surgical History  Procedure Laterality Date  . Carpal tunnel release Right 2005    left 2011  . Cesarean section  1989  . Heel spur surgery Left   . Cholecystectomy  2008  . History of neuroplasty decompression median nerve at carpal tunnel     Family History  Problem Relation Age of Onset  . Colon cancer Neg Hx   . Esophageal cancer Neg Hx   . Rectal cancer Neg Hx   . Stomach cancer Neg Hx    History  Substance Use Topics  . Smoking status: Never Smoker   . Smokeless tobacco: Never Used  . Alcohol Use: No   OB History    No data available     Review of Systems  Cardiovascular: Negative for chest pain.  Gastrointestinal: Negative for abdominal pain.  All other systems reviewed and are negative.     Allergies  Chocolate; Mobic; Neomycin; Neosporin; Niacin and related; Strawberry; and Erythromycin  Home Medications    Prior to Admission medications   Medication Sig Start Date End Date Taking? Authorizing Provider  ranitidine (ZANTAC) 150 MG tablet Take 150 mg by mouth 2 (two) times daily.   Yes Historical Provider, MD  CALCIUM CARBONATE-VITAMIN D PO Take by mouth daily.    Historical Provider, MD  fluticasone (FLONASE) 50 MCG/ACT nasal spray Place 1 spray into the nose. 06/19/14 06/19/15  Historical Provider, MD  gabapentin (NEURONTIN) 300 MG capsule Take 1 capsule (300 mg total) by mouth at bedtime as needed. 08/31/14   Anson Fret, MD  hydrOXYzine (ATARAX/VISTARIL) 50 MG tablet Take 50 mg by mouth 3 (three) times daily as needed for itching.    Historical Provider, MD  lisinopril-hydrochlorothiazide (PRINZIDE,ZESTORETIC) 20-12.5 MG per tablet Take 1 tablet by mouth daily.    Historical Provider, MD  Multiple Vitamins-Minerals (MULTIPLE VITAMINS/WOMENS) tablet Take 1 tablet by mouth daily.    Historical Provider, MD  propranolol ER (INDERAL LA) 80 MG 24 hr capsule TAKE 1 CAPSULE (80 MG TOTAL) BY MOUTH DAILY. 07/02/14   Anson Fret, MD   BP 118/63 mmHg  Pulse 86  Temp(Src) 98.3 F (36.8 C) (Oral)  Resp 20  Ht  (1.702 m)  Wt 302 lb (136.986 kg)  BMI 47.29 kg/m2  SpO2 100% Physical Exam  Constitutional: She is oriented to person, place, and time. She appears well-developed and well-nourished. No distress.  HENT:  Head: Normocephalic and atraumatic.  Neck: Normal range of motion. Neck supple.  Musculoskeletal:  There is swelling to the lateral aspect of the left ankle over the lateral malleolus. Distal pulses, motor, and sensory are intact. There is no medial malleolus tenderness. There is no fifth metatarsal tenderness or proximal fibular tenderness.  Neurological: She is alert and oriented to person, place, and time.  Skin: Skin is warm and dry. She is not diaphoretic.  Nursing note and vitals reviewed.   ED Course  Procedures (including critical care time) Labs Review Labs Reviewed -  No data to display  Imaging Review No results found.   EKG Interpretation None      MDM   Final diagnoses:  None    X-rays reveal possible avulsion fractures of the medial malleolus and talus. This will be treated with rest, ice, crutches, pain medication, and when necessary follow-up with her primary doctor.    Geoffery Lyonsouglas Jahn Franchini, MD 10/13/14 [phone]

## 2014-11-02 ENCOUNTER — Other Ambulatory Visit: Payer: Self-pay

## 2014-11-02 DIAGNOSIS — Z01818 Encounter for other preprocedural examination: Secondary | ICD-10-CM

## 2014-12-13 ENCOUNTER — Encounter: Payer: 59 | Attending: General Surgery | Admitting: Dietician

## 2014-12-13 ENCOUNTER — Encounter: Payer: Self-pay | Admitting: Dietician

## 2014-12-13 DIAGNOSIS — Z6841 Body Mass Index (BMI) 40.0 and over, adult: Secondary | ICD-10-CM | POA: Diagnosis not present

## 2014-12-13 DIAGNOSIS — Z713 Dietary counseling and surveillance: Secondary | ICD-10-CM | POA: Diagnosis not present

## 2014-12-13 NOTE — Progress Notes (Signed)
  Pre-Op Assessment Visit:  Pre-Operative Sleeve Gastrectomy Surgery  Medical Nutrition Therapy:  Appt start time: 325   End time:  410.  Patient was seen on 12/13/2014 for Pre-Operative Nutrition Assessment. Assessment and letter of approval faxed to [facility] Surgery Bariatric Surgery Program coordinator on 12/13/2014.   Preferred Learning Style:   No preference indicated   Learning Readiness:   Ready  Handouts given during visit include:  Pre-Op Goals Bariatric Surgery Protein Shakes   During the appointment today the following Pre-Op Goals were reviewed with the patient: Maintain or lose weight as instructed by your surgeon Make healthy food choices Begin to limit portion sizes Limited concentrated sugars and fried foods Keep fat/sugar in the single digits per serving on   food labels Practice CHEWING your food  (aim for 30 chews per bite or until applesauce consistency) Practice not drinking 15 minutes before, during, and 30 minutes after each meal/snack Avoid all carbonated beverages  Avoid/limit caffeinated beverages  Avoid all sugar-sweetened beverages Consume 3 meals per day; eat every 3-5 hours Make a list of non-food related activities Aim for 64-100 ounces of FLUID daily  Aim for at least 60-80 grams of PROTEIN daily Look for a liquid protein source that contain ?15 g protein and ?5 g carbohydrate  (ex: shakes, drinks, shots)  Patient-Centered Goals: -Better self esteem -Being proud of what she sees in the mirror -Ride in an airplane without seatbelt extender -Amusement park rides   Demonstrated degree of understanding via:  Teach Back  Teaching Method Utilized:  Visual Auditory Hands on  Barriers to learning/adherence to lifestyle change: none  Patient to call the [facility] to enroll in Pre-Op and Post-Op Nutrition Education when surgery date is scheduled.

## 2014-12-13 NOTE — Patient Instructions (Signed)

## 2015-01-10 ENCOUNTER — Ambulatory Visit (HOSPITAL_COMMUNITY)
Admission: RE | Admit: 2015-01-10 | Discharge: 2015-01-10 | Disposition: A | Discharge status: Home / Self Care | Payer: 59 | Source: Ambulatory Visit | Attending: General Surgery | Admitting: General Surgery

## 2015-01-10 ENCOUNTER — Other Ambulatory Visit: Payer: Self-pay

## 2015-01-10 DIAGNOSIS — K449 Diaphragmatic hernia without obstruction or gangrene: Secondary | ICD-10-CM | POA: Diagnosis not present

## 2015-01-10 DIAGNOSIS — Z01818 Encounter for other preprocedural examination: Secondary | ICD-10-CM | POA: Diagnosis not present

## 2015-03-04 ENCOUNTER — Encounter: Payer: 59 | Attending: General Surgery

## 2015-03-04 DIAGNOSIS — Z713 Dietary counseling and surveillance: Secondary | ICD-10-CM | POA: Diagnosis not present

## 2015-03-04 DIAGNOSIS — Z6841 Body Mass Index (BMI) 40.0 and over, adult: Secondary | ICD-10-CM | POA: Diagnosis not present

## 2015-03-05 NOTE — Progress Notes (Signed)
  Pre-Operative Nutrition Class:  Appt start time: 0254   End time:  1830.  Patient was seen on 03/04/15 for Pre-Operative Bariatric Surgery Education at the [facility].   Surgery date: 04/01/15 Surgery type: Sleeve gastrectomy Start weight at [facility]: 299 lbs on 12/13/14 Weight today: 292 lbs  TANITA  BODY COMP RESULTS  03/04/15   BMI (kg/m^2) 45.7   Fat Mass (lbs) 164   Fat Free Mass (lbs) 128   Total Body Water (lbs) 93.5   Samples given per MNT protocol. Patient educated on appropriate usage: Celebrate calcium citrate chew (berry - qty 1) Lot #: [id number] Exp: 07/2016  Premier protein shake (chocolate - qty 1) Lot #: [id number] Exp: 08/2015  Unjury protein powder (chicken soup - qty 1) Lot #: [id number] Exp: 11/2015  PB2 (chocolate - qty 1) Lot #: [id number] Exp: 03/2015  The following the learning objectives were met by the patient during this course:  Identify Pre-Op Dietary Goals and will begin 2 weeks pre-operatively  Identify appropriate sources of fluids and proteins   State protein recommendations and appropriate sources pre and post-operatively  Identify Post-Operative Dietary Goals and will follow for 2 weeks post-operatively  Identify appropriate multivitamin and calcium sources  Describe the need for physical activity post-operatively and will follow MD recommendations  State when to call healthcare provider regarding medication questions or post-operative complications  Handouts given during class include:  Pre-Op Bariatric Surgery Diet Handout  Protein Shake Handout  Post-Op Bariatric Surgery Nutrition Handout  [facility] Program Information Flyer  Support Group Information Flyer  [facility] Bariatric Supplements Price List  Follow-Up Plan: Patient will follow-up at [facility] 2 weeks post operatively for diet advancement per MD.

## 2015-03-29 ENCOUNTER — Other Ambulatory Visit: Payer: Self-pay | Admitting: General Surgery

## 2015-04-02 ENCOUNTER — Ambulatory Visit: Payer: 59

## 2015-04-03 NOTE — Patient Instructions (Signed)
Filbert BertholdLynn D Belay  04/03/2015   Your procedure is scheduled on: 04/09/15    [facility] 3rd floor to  Short Stay Center at    0900 AM.  Call this number if you have problems the morning of surgery [phone]   Remember: ONLY 1 PERSON MAY GO WITH YOU TO SHORT STAY TO GET  READY MORNING OF YOUR SURGERY.  Do not eat food or drink liquids :After Midnight.     Take these medicines the morning of surgery with A SIP OF WATER: Flonase nasal spray, Inderal LA ( Propanolol), Zantac                                 You may not have any metal on your body including hair pins and              piercings  Do not wear jewelry, make-up, lotions, powders or perfumes, deodorant             Do not wear nail polish.  Do not shave  48 hours prior to surgery.               Do not bring valuables to the hospital. [facility] IS NOT             RESPONSIBLE   FOR VALUABLES.  Contacts, dentures or bridgework may not be worn into surgery.  Leave suitcase in the car. After surgery it may be brought to your room.         Special Instructions: coughing and deep breathing exercises, leg exercises               Please read over the following fact sheets you were given: _____________________________________________________________________             [facility] Pre[facility] Before surgery, you can play an important role.  Because skin is not sterile, your skin needs to be as free of germs as possible.  You can reduce the number of germs on your skin by washing with CHG (chlorahexidine gluconate) soap before surgery.  CHG is an antiseptic cleaner which kills germs and bonds with the skin to continue killing germs even after washing. Please DO NOT use if you have an allergy to CHG or antibacterial soaps.  If your skin becomes reddened/irritated stop using the CHG and inform your nurse when you arrive at Short Stay. Do not shave  (including legs and underarms) for at least 48 hours prior to the first CHG shower.  You may shave your face/neck. Please follow these instructions carefully:  1.  Shower with CHG Soap the night before surgery and the  morning of Surgery.  2.  If you choose to wash your hair, wash your hair first as usual with your  normal  shampoo.  3.  After you shampoo, rinse your hair and body thoroughly to remove the  shampoo.                           4.  Use CHG as you would any other liquid soap.  You can apply chg directly  to the skin and wash  Gently with a scrungie or clean washcloth.  5.  Apply the CHG Soap to your body ONLY FROM THE NECK DOWN.   Do not use on face/ open                           Wound or open sores. Avoid contact with eyes, ears mouth and genitals (private parts).                       Wash face,  Genitals (private parts) with your normal soap.             6.  Wash thoroughly, paying special attention to the area where your surgery  will be performed.  7.  Thoroughly rinse your body with warm water from the neck down.  8.  DO NOT shower/wash with your normal soap after using and rinsing off  the CHG Soap.                9.  Pat yourself dry with a clean towel.            10.  Wear clean pajamas.            11.  Place clean sheets on your bed the night of your first shower and do not  sleep with pets. Day of Surgery : Do not apply any lotions/deodorants the morning of surgery.  Please wear clean clothes to the hospital/surgery center.  FAILURE TO FOLLOW THESE INSTRUCTIONS MAY RESULT IN THE CANCELLATION OF YOUR SURGERY PATIENT SIGNATURE_________________________________  NURSE SIGNATURE__________________________________  ________________________________________________________________________

## 2015-04-04 ENCOUNTER — Encounter (HOSPITAL_COMMUNITY)
Admission: RE | Admit: 2015-04-04 | Discharge: 2015-04-04 | Disposition: A | Discharge status: Home / Self Care | Payer: 59 | Source: Ambulatory Visit | Attending: General Surgery | Admitting: General Surgery

## 2015-04-04 ENCOUNTER — Encounter (HOSPITAL_COMMUNITY): Payer: Self-pay

## 2015-04-04 ENCOUNTER — Encounter (INDEPENDENT_AMBULATORY_CARE_PROVIDER_SITE_OTHER): Payer: Self-pay

## 2015-04-04 DIAGNOSIS — Z01818 Encounter for other preprocedural examination: Secondary | ICD-10-CM | POA: Insufficient documentation

## 2015-04-04 HISTORY — DX: Gastro-esophageal reflux disease without esophagitis: K21.9

## 2015-04-04 LAB — COMPREHENSIVE METABOLIC PANEL
ALBUMIN: 3.9 g/dL (ref 3.5–5.0)
ALT: 19 U/L (ref 14–54)
ANION GAP: 8 (ref 5–15)
AST: 25 U/L (ref 15–41)
Alkaline Phosphatase: 75 U/L (ref 38–126)
BUN: 25 mg/dL — AB (ref 6–20)
CHLORIDE: 102 mmol/L (ref 101–111)
CO2: 30 mmol/L (ref 22–32)
Calcium: 9.4 mg/dL (ref 8.9–10.3)
Creatinine, Ser: 1.04 mg/dL — ABNORMAL HIGH (ref 0.44–1.00)
GFR calc Af Amer: >60 mL/min (ref >60)
Glucose, Bld: 91 mg/dL (ref 65–99)
POTASSIUM: 3.2 mmol/L — AB (ref 3.5–5.1)
Sodium: 140 mmol/L (ref 135–145)
Total Bilirubin: 0.9 mg/dL (ref 0.3–1.2)
Total Protein: 7 g/dL (ref 6.5–8.1)

## 2015-04-04 LAB — CBC WITH DIFFERENTIAL/PLATELET
BASOS ABS: 0 10*3/uL (ref 0.0–0.1)
BASOS PCT: 0 %
EOS PCT: 2 %
Eosinophils Absolute: 0.2 10*3/uL (ref 0.0–0.7)
HCT: 40.3 % (ref 36.0–46.0)
Hemoglobin: 13.1 g/dL (ref 12.0–15.0)
LYMPHS PCT: 36 %
Lymphs Abs: 2.4 10*3/uL (ref 0.7–4.0)
MCH: 31.2 pg (ref 26.0–34.0)
MCHC: 32.5 g/dL (ref 30.0–36.0)
MCV: 96 fL (ref 78.0–100.0)
MONO ABS: 0.7 10*3/uL (ref 0.1–1.0)
Monocytes Relative: 11 %
NEUTROS ABS: 3.4 10*3/uL (ref 1.7–7.7)
NEUTROS PCT: 51 %
PLATELETS: 293 10*3/uL (ref 150–400)
RBC: 4.2 MIL/uL (ref 3.87–5.11)
RDW: 14 % (ref 11.5–15.5)
WBC: 6.7 10*3/uL (ref 4.0–10.5)

## 2015-04-04 NOTE — Progress Notes (Signed)
EKG and CXR- 01/10/15- EPIC

## 2015-04-04 NOTE — Progress Notes (Signed)
CMP done 04/04/15 faxed via EPIC to Dr Johna SheriffHoxworth.

## 2015-04-09 ENCOUNTER — Encounter (HOSPITAL_COMMUNITY)
Admission: RE | Disposition: A | Discharge status: Home / Self Care | Payer: Self-pay | Source: Ambulatory Visit | Attending: General Surgery

## 2015-04-09 ENCOUNTER — Inpatient Hospital Stay (HOSPITAL_COMMUNITY): Payer: 59 | Admitting: Certified Registered"

## 2015-04-09 ENCOUNTER — Encounter (HOSPITAL_COMMUNITY): Payer: Self-pay | Admitting: *Deleted

## 2015-04-09 ENCOUNTER — Inpatient Hospital Stay (HOSPITAL_COMMUNITY)
Admission: RE | Admit: 2015-04-09 | Discharge: 2015-04-11 | DRG: 621 | Disposition: A | Discharge status: Home / Self Care | Payer: 59 | Source: Ambulatory Visit | Attending: General Surgery | Admitting: General Surgery

## 2015-04-09 DIAGNOSIS — Z01812 Encounter for preprocedural laboratory examination: Secondary | ICD-10-CM | POA: Diagnosis not present

## 2015-04-09 DIAGNOSIS — K219 Gastro-esophageal reflux disease without esophagitis: Secondary | ICD-10-CM | POA: Diagnosis present

## 2015-04-09 DIAGNOSIS — G8929 Other chronic pain: Secondary | ICD-10-CM | POA: Diagnosis present

## 2015-04-09 DIAGNOSIS — Z79899 Other long term (current) drug therapy: Secondary | ICD-10-CM

## 2015-04-09 DIAGNOSIS — Z6841 Body Mass Index (BMI) 40.0 and over, adult: Secondary | ICD-10-CM | POA: Diagnosis not present

## 2015-04-09 DIAGNOSIS — R944 Abnormal results of kidney function studies: Secondary | ICD-10-CM | POA: Diagnosis present

## 2015-04-09 DIAGNOSIS — I10 Essential (primary) hypertension: Secondary | ICD-10-CM | POA: Diagnosis present

## 2015-04-09 DIAGNOSIS — K449 Diaphragmatic hernia without obstruction or gangrene: Secondary | ICD-10-CM | POA: Diagnosis present

## 2015-04-09 DIAGNOSIS — Z809 Family history of malignant neoplasm, unspecified: Secondary | ICD-10-CM

## 2015-04-09 DIAGNOSIS — Z8249 Family history of ischemic heart disease and other diseases of the circulatory system: Secondary | ICD-10-CM | POA: Diagnosis not present

## 2015-04-09 HISTORY — PX: LAPAROSCOPIC GASTRIC SLEEVE RESECTION WITH HIATAL HERNIA REPAIR: SHX6512

## 2015-04-09 HISTORY — PX: UPPER GI ENDOSCOPY: SHX6162

## 2015-04-09 LAB — HEMOGLOBIN AND HEMATOCRIT, BLOOD
HEMATOCRIT: 44.5 % (ref 36.0–46.0)
Hemoglobin: 15.1 g/dL — ABNORMAL HIGH (ref 12.0–15.0)

## 2015-04-09 LAB — PREGNANCY, URINE: PREG TEST UR: NEGATIVE

## 2015-04-09 SURGERY — GASTRECTOMY, SLEEVE, LAPAROSCOPIC, WITH HIATAL HERNIA REPAIR
Anesthesia: General

## 2015-04-09 MED ORDER — FENTANYL CITRATE (PF) 250 MCG/5ML IJ SOLN
INTRAMUSCULAR | Status: DC | PRN
  Administered 2015-04-09 (×7): 50 ug via INTRAVENOUS

## 2015-04-09 MED ORDER — ROCURONIUM BROMIDE 100 MG/10ML IV SOLN
INTRAVENOUS | Status: AC
  Filled 2015-04-09: qty 1

## 2015-04-09 MED ORDER — ROCURONIUM BROMIDE 100 MG/10ML IV SOLN
INTRAVENOUS | Status: DC | PRN
  Administered 2015-04-09: 50 mg via INTRAVENOUS
  Administered 2015-04-09: 10 mg via INTRAVENOUS

## 2015-04-09 MED ORDER — CHLORHEXIDINE GLUCONATE CLOTH 2 % EX PADS: 6.0000 | MEDICATED_PAD | Freq: Once | CUTANEOUS | Status: DC

## 2015-04-09 MED ORDER — MORPHINE SULFATE (PF) 2 MG/ML IV SOLN
2.0000 mg | INTRAVENOUS | Status: DC | PRN
  Administered 2015-04-09 – 2015-04-10 (×3): 2 mg via INTRAVENOUS
  Filled 2015-04-09 (×3): qty 1

## 2015-04-09 MED ORDER — LIDOCAINE HCL (CARDIAC) 20 MG/ML IV SOLN
INTRAVENOUS | Status: AC
  Filled 2015-04-09: qty 5

## 2015-04-09 MED ORDER — PROPOFOL 10 MG/ML IV BOLUS
INTRAVENOUS | Status: DC | PRN
  Administered 2015-04-09: 150 mg via INTRAVENOUS

## 2015-04-09 MED ORDER — SODIUM CHLORIDE 0.9 % IJ SOLN
INTRAMUSCULAR | Status: DC | PRN
  Administered 2015-04-09: 10 mL via INTRAVENOUS

## 2015-04-09 MED ORDER — EVICEL 5 ML EX KIT
PACK | Freq: Once | CUTANEOUS | Status: AC
  Administered 2015-04-09: 5 mL
  Filled 2015-04-09: qty 1

## 2015-04-09 MED ORDER — PROMETHAZINE HCL 25 MG/ML IJ SOLN
INTRAMUSCULAR | Status: AC
Start: 2015-04-09 — End: 2015-04-10
  Filled 2015-04-09: qty 1

## 2015-04-09 MED ORDER — ENOXAPARIN SODIUM 30 MG/0.3ML ~~LOC~~ SOLN
30.0000 mg | Freq: Two times a day (BID) | SUBCUTANEOUS | Status: DC
  Administered 2015-04-10 – 2015-04-11 (×3): 30 mg via SUBCUTANEOUS
  Filled 2015-04-09 (×5): qty 0.3

## 2015-04-09 MED ORDER — DEXAMETHASONE SODIUM PHOSPHATE 10 MG/ML IJ SOLN
INTRAMUSCULAR | Status: AC
  Filled 2015-04-09: qty 1

## 2015-04-09 MED ORDER — OXYCODONE HCL 5 MG/5ML PO SOLN
5.0000 mg | ORAL | Status: DC | PRN
  Administered 2015-04-10: 5 mg via ORAL
  Administered 2015-04-10: 10 mg via ORAL
  Filled 2015-04-09: qty 5
  Filled 2015-04-09: qty 10

## 2015-04-09 MED ORDER — ONDANSETRON HCL 4 MG/2ML IJ SOLN
4.0000 mg | Freq: Once | INTRAMUSCULAR | Status: DC | PRN
Start: 2015-04-09 — End: 2015-04-09

## 2015-04-09 MED ORDER — LACTATED RINGERS IR SOLN
Status: DC | PRN
  Administered 2015-04-09: 1000 mL

## 2015-04-09 MED ORDER — PANTOPRAZOLE SODIUM 40 MG IV SOLR
40.0000 mg | Freq: Every day | INTRAVENOUS | Status: DC
  Administered 2015-04-09 – 2015-04-10 (×2): 40 mg via INTRAVENOUS
  Filled 2015-04-09 (×3): qty 40

## 2015-04-09 MED ORDER — MIDAZOLAM HCL 2 MG/2ML IJ SOLN
INTRAMUSCULAR | Status: AC
  Filled 2015-04-09: qty 4

## 2015-04-09 MED ORDER — ACETAMINOPHEN 160 MG/5ML PO SOLN
650.0000 mg | ORAL | Status: DC | PRN
  Administered 2015-04-10 – 2015-04-11 (×2): 650 mg via ORAL
  Filled 2015-04-09: qty 20.3

## 2015-04-09 MED ORDER — LIDOCAINE HCL (CARDIAC) 20 MG/ML IV SOLN
INTRAVENOUS | Status: DC | PRN
  Administered 2015-04-09: 50 mg via INTRAVENOUS

## 2015-04-09 MED ORDER — BUPIVACAINE-EPINEPHRINE 0.25% -1:200000 IJ SOLN
INTRAMUSCULAR | Status: DC | PRN
  Administered 2015-04-09: 29 mL

## 2015-04-09 MED ORDER — PROPOFOL 10 MG/ML IV BOLUS
INTRAVENOUS | Status: AC
  Filled 2015-04-09: qty 20

## 2015-04-09 MED ORDER — SODIUM CHLORIDE 0.9 % IJ SOLN
INTRAMUSCULAR | Status: AC
  Filled 2015-04-09: qty 10

## 2015-04-09 MED ORDER — BUPIVACAINE LIPOSOME 1.3 % IJ SUSP
20.0000 mL | Freq: Once | INTRAMUSCULAR | Status: DC
  Filled 2015-04-09: qty 20

## 2015-04-09 MED ORDER — ONDANSETRON HCL 4 MG/2ML IJ SOLN
INTRAMUSCULAR | Status: DC | PRN
  Administered 2015-04-09: 4 mg via INTRAVENOUS

## 2015-04-09 MED ORDER — LABETALOL HCL 5 MG/ML IV SOLN
INTRAVENOUS | Status: DC | PRN
  Administered 2015-04-09 (×2): 5 mg via INTRAVENOUS
  Administered 2015-04-09: 10 mg via INTRAVENOUS

## 2015-04-09 MED ORDER — MEPERIDINE HCL 50 MG/ML IJ SOLN: 6.2500 mg | INTRAMUSCULAR | Status: DC | PRN

## 2015-04-09 MED ORDER — DEXTROSE 5 % IV SOLN
2.0000 g | INTRAVENOUS | Status: AC
  Administered 2015-04-09: 2 g via INTRAVENOUS

## 2015-04-09 MED ORDER — SUGAMMADEX SODIUM 200 MG/2ML IV SOLN
INTRAVENOUS | Status: AC
  Filled 2015-04-09: qty 2

## 2015-04-09 MED ORDER — ONDANSETRON HCL 4 MG/2ML IJ SOLN
4.0000 mg | INTRAMUSCULAR | Status: DC | PRN
  Administered 2015-04-09 – 2015-04-10 (×2): 4 mg via INTRAVENOUS
  Filled 2015-04-09 (×2): qty 2

## 2015-04-09 MED ORDER — ACETAMINOPHEN 160 MG/5ML PO SOLN
325.0000 mg | ORAL | Status: DC | PRN
  Filled 2015-04-09: qty 20.3

## 2015-04-09 MED ORDER — FENTANYL CITRATE (PF) 250 MCG/5ML IJ SOLN
INTRAMUSCULAR | Status: AC
  Filled 2015-04-09: qty 25

## 2015-04-09 MED ORDER — SUGAMMADEX SODIUM 200 MG/2ML IV SOLN
INTRAVENOUS | Status: DC | PRN
  Administered 2015-04-09: 100 mg via INTRAVENOUS

## 2015-04-09 MED ORDER — MIDAZOLAM HCL 2 MG/2ML IJ SOLN
INTRAMUSCULAR | Status: DC | PRN
  Administered 2015-04-09: 2 mg via INTRAVENOUS

## 2015-04-09 MED ORDER — LIP MEDEX EX OINT
TOPICAL_OINTMENT | CUTANEOUS | Status: AC
  Administered 2015-04-09: 18:00:00
  Filled 2015-04-09: qty 7

## 2015-04-09 MED ORDER — BUPIVACAINE LIPOSOME 1.3 % IJ SUSP
INTRAMUSCULAR | Status: DC | PRN
  Administered 2015-04-09: 20 mL

## 2015-04-09 MED ORDER — DEXTROSE 5 % IV SOLN
INTRAVENOUS | Status: AC
  Filled 2015-04-09: qty 2

## 2015-04-09 MED ORDER — HEPARIN SODIUM (PORCINE) 5000 UNIT/ML IJ SOLN
5000.0000 [IU] | INTRAMUSCULAR | Status: AC
  Administered 2015-04-09: 5000 [IU] via SUBCUTANEOUS
  Filled 2015-04-09: qty 1

## 2015-04-09 MED ORDER — METOPROLOL TARTRATE 1 MG/ML IV SOLN
5.0000 mg | Freq: Four times a day (QID) | INTRAVENOUS | Status: DC
  Administered 2015-04-09 – 2015-04-11 (×6): 5 mg via INTRAVENOUS
  Filled 2015-04-09 (×11): qty 5

## 2015-04-09 MED ORDER — PROMETHAZINE HCL 25 MG/ML IJ SOLN
6.2500 mg | INTRAMUSCULAR | Status: AC | PRN
  Administered 2015-04-09 (×2): 6.25 mg via INTRAVENOUS

## 2015-04-09 MED ORDER — HYDROMORPHONE HCL 1 MG/ML IJ SOLN
0.2500 mg | INTRAMUSCULAR | Status: DC | PRN
  Administered 2015-04-09 (×2): 0.5 mg via INTRAVENOUS

## 2015-04-09 MED ORDER — SODIUM CHLORIDE 0.9 % IJ SOLN
INTRAMUSCULAR | Status: AC
Start: 2015-04-09 — End: 2015-04-10
  Filled 2015-04-09: qty 10

## 2015-04-09 MED ORDER — PREMIER PROTEIN SHAKE
2.0000 [oz_av] | Freq: Four times a day (QID) | ORAL | Status: DC
  Administered 2015-04-11 (×2): 2 [oz_av] via ORAL

## 2015-04-09 MED ORDER — DEXAMETHASONE SODIUM PHOSPHATE 10 MG/ML IJ SOLN
INTRAMUSCULAR | Status: DC | PRN
  Administered 2015-04-09: 10 mg via INTRAVENOUS

## 2015-04-09 MED ORDER — STERILE WATER FOR IRRIGATION IR SOLN
Status: DC | PRN
  Administered 2015-04-09: 2000 mL

## 2015-04-09 MED ORDER — HYDROMORPHONE HCL 1 MG/ML IJ SOLN
INTRAMUSCULAR | Status: AC
  Filled 2015-04-09: qty 1

## 2015-04-09 MED ORDER — ONDANSETRON HCL 4 MG/2ML IJ SOLN
INTRAMUSCULAR | Status: AC
  Filled 2015-04-09: qty 2

## 2015-04-09 MED ORDER — POTASSIUM CHLORIDE IN NACL 20-0.9 MEQ/L-% IV SOLN
INTRAVENOUS | Status: DC
  Administered 2015-04-09: 100 mL/h via INTRAVENOUS
  Administered 2015-04-10 – 2015-04-11 (×2): via INTRAVENOUS
  Filled 2015-04-09 (×5): qty 1000

## 2015-04-09 MED ORDER — BUPIVACAINE-EPINEPHRINE 0.25% -1:200000 IJ SOLN
INTRAMUSCULAR | Status: AC
  Filled 2015-04-09: qty 1

## 2015-04-09 MED ORDER — LACTATED RINGERS IV SOLN
INTRAVENOUS | Status: DC
Start: 2015-04-09 — End: 2015-04-09
  Administered 2015-04-09: 12:00:00 via INTRAVENOUS
  Administered 2015-04-09: 1000 mL via INTRAVENOUS

## 2015-04-09 MED ORDER — 0.9 % SODIUM CHLORIDE (POUR BTL) OPTIME
TOPICAL | Status: DC | PRN
  Administered 2015-04-09: 1000 mL

## 2015-04-09 SURGICAL SUPPLY — 62 items
APPLICATOR COTTON TIP 6IN STRL (MISCELLANEOUS) IMPLANT
APPLIER CLIP ROT 10 11.4 M/L (STAPLE)
APPLIER CLIP ROT 13.4 12 LRG (CLIP)
APR CLP LRG 13.4X12 ROT 20 MLT (CLIP)
APR CLP MED LRG 11.4X10 (STAPLE)
BAG SPEC RTRVL LRG 6X4 10 (ENDOMECHANICALS)
BLADE SURG SZ11 CARB STEEL (BLADE) ×3 IMPLANT
CABLE HIGH FREQUENCY MONO STRZ (ELECTRODE) ×3 IMPLANT
CHLORAPREP W/TINT 26ML (MISCELLANEOUS) ×6 IMPLANT
CLIP APPLIE ROT 10 11.4 M/L (STAPLE) IMPLANT
CLIP APPLIE ROT 13.4 12 LRG (CLIP) IMPLANT
COVER SURGICAL LIGHT HANDLE (MISCELLANEOUS) IMPLANT
DEVICE SUT QUICK LOAD TK 5 (STAPLE) ×12 IMPLANT
DEVICE SUT TI-KNOT TK 5X26 (MISCELLANEOUS) ×3 IMPLANT
DEVICE SUTURE ENDOST 10MM (ENDOMECHANICALS) ×3 IMPLANT
DEVICE TROCAR PUNCTURE CLOSURE (ENDOMECHANICALS) ×3 IMPLANT
DRAPE CAMERA CLOSED 9X96 (DRAPES) ×3 IMPLANT
DRAPE UTILITY XL STRL (DRAPES) ×6 IMPLANT
ELECT REM PT RETURN 9FT ADLT (ELECTROSURGICAL) ×3
ELECTRODE REM PT RTRN 9FT ADLT (ELECTROSURGICAL) ×2 IMPLANT
EVICEL AIRLESS SPRAY ACCES (MISCELLANEOUS) ×3 IMPLANT
GAUZE SPONGE 4X4 12PLY STRL (GAUZE/BANDAGES/DRESSINGS) IMPLANT
GLOVE BIOGEL PI IND STRL 7.5 (GLOVE) ×2 IMPLANT
GLOVE BIOGEL PI INDICATOR 7.5 (GLOVE) ×1
GLOVE ECLIPSE 7.5 STRL STRAW (GLOVE) ×3 IMPLANT
GOWN STRL REUS W/TWL XL LVL3 (GOWN DISPOSABLE) ×15 IMPLANT
HOVERMATT SINGLE USE (MISCELLANEOUS) ×3 IMPLANT
KIT BASIN OR (CUSTOM PROCEDURE TRAY) ×3 IMPLANT
LIQUID BAND (GAUZE/BANDAGES/DRESSINGS) ×3 IMPLANT
NEEDLE SPNL 22GX3.5 QUINCKE BK (NEEDLE) ×3 IMPLANT
PACK UNIVERSAL I (CUSTOM PROCEDURE TRAY) ×3 IMPLANT
PEN SKIN MARKING BROAD (MISCELLANEOUS) ×3 IMPLANT
POUCH SPECIMEN RETRIEVAL 10MM (ENDOMECHANICALS) IMPLANT
RELOAD ENDO STITCH (ENDOMECHANICALS) ×9 IMPLANT
RELOAD STAPLER BLUE 60MM (STAPLE) ×4 IMPLANT
RELOAD STAPLER GOLD 60MM (STAPLE) ×4 IMPLANT
RELOAD STAPLER GREEN 60MM (STAPLE) ×4 IMPLANT
SCISSORS LAP 5X45 EPIX DISP (ENDOMECHANICALS) ×3 IMPLANT
SET IRRIG TUBING LAPAROSCOPIC (IRRIGATION / IRRIGATOR) ×3 IMPLANT
SHEARS HARMONIC ACE PLUS 45CM (MISCELLANEOUS) ×3 IMPLANT
SLEEVE ADV FIXATION 5X100MM (TROCAR) ×3 IMPLANT
SLEEVE GASTRECTOMY 36FR VISIGI (MISCELLANEOUS) ×3 IMPLANT
SOLUTION ANTI FOG 6CC (MISCELLANEOUS) ×3 IMPLANT
SPONGE LAP 18X18 X RAY DECT (DISPOSABLE) ×3 IMPLANT
STAPLER ECHELON LONG 60 440 (INSTRUMENTS) ×3 IMPLANT
STAPLER RELOAD BLUE 60MM (STAPLE) ×6
STAPLER RELOAD GOLD 60MM (STAPLE) ×6
STAPLER RELOAD GREEN 60MM (STAPLE) ×6
SUT DEVICE BRAIDED 0X39 (SUTURE) IMPLANT
SUT MNCRL AB 4-0 PS2 18 (SUTURE) ×3 IMPLANT
SUT VICRYL 0 TIES 12 18 (SUTURE) ×3 IMPLANT
SYR 10ML ECCENTRIC (SYRINGE) ×3 IMPLANT
SYR 20CC LL (SYRINGE) ×3 IMPLANT
TOWEL OR 17X26 10 PK STRL BLUE (TOWEL DISPOSABLE) ×3 IMPLANT
TOWEL OR NON WOVEN STRL DISP B (DISPOSABLE) ×3 IMPLANT
TROCAR ADV FIXATION 5X100MM (TROCAR) ×3 IMPLANT
TROCAR BLADELESS 15MM (ENDOMECHANICALS) ×3 IMPLANT
TROCAR BLADELESS OPT 5 100 (ENDOMECHANICALS) ×3 IMPLANT
TUBE CALIBRATION LAPBAND (TUBING) ×3 IMPLANT
TUBING CONNECTING 10 (TUBING) ×3 IMPLANT
TUBING ENDO SMARTCAP PENTAX (MISCELLANEOUS) ×3 IMPLANT
TUBING FILTER THERMOFLATOR (ELECTROSURGICAL) ×3 IMPLANT

## 2015-04-09 NOTE — Anesthesia Procedure Notes (Signed)
Procedure Name: Intubation Date/Time: 04/09/2015 11:17 AM Performed by: Donna BernardRIMBLE, Quintel Mccalla H Pre-anesthesia Checklist: Patient identified, Emergency Drugs available, Suction available, Patient being monitored and Timeout performed Patient Re-evaluated:Patient Re-evaluated prior to inductionOxygen Delivery Method: Circle system utilized Preoxygenation: Pre-oxygenation with 100% oxygen Intubation Type: IV induction Ventilation: Mask ventilation without difficulty Laryngoscope Size: 3 and Mac Grade View: Grade I Tube type: Oral Tube size: 7.5 mm Number of attempts: 1 Airway Equipment and Method: Stylet Placement Confirmation: positive ETCO2,  ETT inserted through vocal cords under direct vision,  CO2 detector and breath sounds checked- equal and bilateral Secured at: 23 cm Tube secured with: Tape Dental Injury: Teeth and Oropharynx as per pre-operative assessment

## 2015-04-09 NOTE — Anesthesia Preprocedure Evaluation (Signed)
Anesthesia Evaluation  Patient identified by MRN, date of birth, ID band Patient awake    Reviewed: Allergy & Precautions, NPO status , Patient's Chart, lab work & pertinent test results  Airway Mallampati: I  TM Distance: >3 FB Neck ROM: Full    Dental   Pulmonary    Pulmonary exam normal        Cardiovascular hypertension, Pt. on medications Normal cardiovascular exam     Neuro/Psych    GI/Hepatic GERD  ,  Endo/Other    Renal/GU      Musculoskeletal   Abdominal   Peds  Hematology   Anesthesia Other Findings   Reproductive/Obstetrics                             Anesthesia Physical Anesthesia Plan  ASA: III  Anesthesia Plan: General   Post-op Pain Management:    Induction: Intravenous  Airway Management Planned: Oral ETT  Additional Equipment:   Intra-op Plan:   Post-operative Plan: Extubation in OR  Informed Consent: I have reviewed the patients History and Physical, chart, labs and discussed the procedure including the risks, benefits and alternatives for the proposed anesthesia with the patient or authorized representative who has indicated his/her understanding and acceptance.     Plan Discussed with: CRNA and Surgeon  Anesthesia Plan Comments:         Anesthesia Quick Evaluation

## 2015-04-09 NOTE — Op Note (Signed)
Preoperative Diagnosis: Morbid Obesity  Postoprative Diagnosis: Morbid Obesity  Procedure: Procedure(s): LAPAROSCOPIC GASTRIC SLEEVE RESECTION WITH HIATAL HERNIA REPAIR UPPER GI ENDOSCOPY   Surgeon: Glenna Fellows T   Assistants: Gaynelle Adu  Anesthesia:  General endotracheal anesthesia  Indications: Patient is a 54 year old female with progressive morbid obesity unresponsive to medical management who presents at a BMI of 47 with comorbidities of hypertension and GERD. After extensive preoperative workup and discussion regarding all the alternatives and risks detailed elsewhere with electric proceed with laparoscopic sleeve gastrectomy for treatment of her morbid obesity. Upper GI series shows a small hiatal hernia and we have discussed repairing this based on operative findings.    Procedure Detail:  Patient was brought to the operating room, placed in the supine position on the operating table, and general endotracheal anesthesia induced. She received subcutaneous heparin and IV antibiotics. PAS weren't placed. The abdomen was widely sterilely prepped and draped. A timeout was performed and correct procedure verified. Access was obtained with a 5 mm Optiview trocar in the left upper quadrant without difficulty. No evidence of trocar injury. Under direct vision a 5 mm trocar was placed laterally in the right upper quadrant, a 15 mm trocar in the right mid abdomen at the base of the falciform ligament, a 5 mm trocar just above the left of the umbilicus for the camera port and additional 5 mm trocar in the left upper quadrant. Through a 5 mm subxiphoid site the [facility] retractor was placed in the left lobe liver elevated with excellent exposure of the stomach and hiatus. We initially tested for hiatal hernia with the calibration tube with balloon. The balloon blown up to 10 mL this pulled back through the hiatus. We elect to proceed with hiatal hernia repair. There did seem to be some slight  dimpling. The gastrohepatic omentum was divided with the Harmonic scalpel and the peritoneum overlying the anterior esophagus was incised. Peritoneum was incised just anterior to the right crus and careful blunt dissection was carried back into the retroesophageal space. The aorta was noted a little more anterior than typical between the crura and this was obviously carefully avoided. The left crus was identified and completely dissected. There was a moderate sized hiatal hernia. There was plenty of esophageal length and about 5 cm. A posterior crural repair was performed with 2-0 Ethibond sutures closing the hiatus down to about a 2-1/2 cm opening. Following this we proceeded with the sleeve gastrectomy. Measuring 5 cm from the pylorus which was clearly identified the greater curve was dissected dividing the lesser omentum with the Harmonic scalpel and the lesser sac quickly entered. Dissection progressed proximally along the greater curve of the stomach and short gastric vessels were ligated with the Harmonic scalpel. The fundus was dissected away from the spleen and further short gastric vessels divided. The left crus was identified and completely dissected back to our crural repair and the entire stomach was mobilized completely on its lesser curve vasculature. The VisiG tube was passed orally into the stomach and positioned along the lesser curve and the stomach splayed out symmetrically was placed on suction. Beginning 5 cm from the pylorus and additional firing of the green load 60 mm echelon stapler was performed angling up the stomach and away from the incisura. A second firing of the green load 60 mm stapler was performed carrying past the incisura allowing a little extra room adjacent to the tube at this point. Following this 2 firings of the gold load 60 mm echelon  stapler were performed along the side of the VisiG tube but not tensely against it. 2 further firings of the blue load 60 mm stapler were  used to complete the sleeve with the last firing angling just out lateral to the esophageal fat pad. The sleeve was then insufflated and under saline irrigation there was no evidence of leak. The VisiG tube was removed. Dr. Andrey CampanileWilson performed upper endoscopy showing no stricture or bleeding with a nice symmetrical sleeve. A few bleeding points along the staple line were controlled with clips and the entire staple line was coated with Evaseal. There was no evidence of bleeding or trocar injury or other problems. The Nathanson retractor was removed under direct vision. Trocar sites were infiltrated with Exparel local anesthesia. The trocar site fascial defect at the 15 mm trocar was closed with interrupted 0 Vicryl using the Endo Close. Skin incisions were closed with subcuticular 4-0 Monocryl and Dermabond. Sponge needle and instrument counts were correct.    Findings: As above  Estimated Blood Loss:  Minimal         Drains: none  Blood Given: none          Specimens: Gastric sleeve resection        Complications:  * No complications entered in OR log *         Disposition: PACU - hemodynamically stable.         Condition: stable

## 2015-04-09 NOTE — Transfer of Care (Signed)
Immediate Anesthesia Transfer of Care Note  Patient: Ashley [name]  Procedure(s) Performed: Procedure(s): LAPAROSCOPIC GASTRIC SLEEVE RESECTION WITH HIATAL HERNIA REPAIR UPPER GI ENDOSCOPY  Patient Location: PACU  Anesthesia Type:General  Level of Consciousness: alert   Airway & Oxygen Therapy: Patient connected to face mask oxygen  Post-op Assessment: Post -op Vital signs reviewed and stable  Post vital signs: stable  Last Vitals:  Filed Vitals:   04/09/15 0915  BP: 177/89  Pulse: 67  Temp: 36.2 C  Resp: 16    Complications: No apparent anesthesia complications

## 2015-04-09 NOTE — H&P (Signed)
History of Present Illness Ashley [name] T. Ashley Schuff MD; 03/22/2015 4:04 PM) Patient words: pre-op.  The patient is a 54 year old female who presents with obesity. She returns for a preoperative visit prior to planned laparoscopic sleeve gastrectomy for morbid obesity. The patient gives a history of progressive obesity since early adulthood despite multiple attempts at medical management. Obesity has been affecting the patient in a number of ways including fatigue and difficulty with daily activities. Significant co-morbid illnesses have developed including hypertension and GERD.   On preoperative workup there were no concerns on psychological nutrition evaluation Upper GI series showed a small hiatal hernia without reflux.  Laboratory showed a mildly elevated creatinine of 1.29 which on follow-up initially at her primary was slightly improved but more recently increased to 1.49. Her lisinopril was stopped. She has had an appointment with a nephrologist and she will have a renal ultrasound and follow-up visit in about a week prior to her scheduled leave gastrectomy. She generally has been feeling reasonably well with no other intercurrent illness.   Problem List/Past Medical Ashley Saa, MD; 03/22/2015 4:04 PM) MORBID OBESITY WITH BMI OF 45.0-49.9, ADULT (E66.01)  Other Problems Ashley Saa, MD; 03/22/2015 4:04 PM) Gastroesophageal Reflux Disease High blood pressure  Past Surgical History Ashley Saa, MD; 03/22/2015 4:04 PM) Foot Surgery Left. Gallbladder Surgery - Laparoscopic Oral Surgery Cesarean Section - 1  Diagnostic Studies History Ashley Saa, MD; 03/22/2015 4:04 PM) Colonoscopy 1-5 years ago Mammogram 1-3 years ago Pap Smear 1-5 years ago  Allergies Ashley [name], CMA; 03/22/2015 3:45 PM) Chocolate Flavor *PHARMACEUTICAL ADJUVANTS* Strawberry Flavor *PHARMACEUTICAL ADJUVANTS* Mobic *ANALGESICS -  ANTI-INFLAMMATORY* Neomycin Sulfate *Aminoglycosides** Erythromycin *MACROLIDES*  Medication History (Ashley [name], CMA; 03/22/2015 3:45 PM) HydroCHLOROthiazide (12.5MG  Capsule, Oral) Active. OxyCODONE HCl ( /5ML Solution, 5-10 Milliliter Oral every four hours, as needed, Taken starting 03/22/2015) Active. Protonix (  Tablet DR, 1 (one) Tablet Oral daily, Taken starting 03/22/2015) Active. Zofran ODT (  Tablet Disperse, 1 (one) Tablet Disperse Oral every six hours, as needed, Taken starting 03/22/2015) Active. Ranitidine HCl (  Tablet, Oral) Active. Propranolol HCl ER (  Capsule ER 24HR, Oral) Active. HydrOXYzine HCl (  Tablet, Oral) Active. Gabapentin (  Capsule, Oral) Active. Medications Reconciled  Social History Ashley Saa, MD; 03/22/2015 4:04 PM) No alcohol use No drug use Tobacco use Never smoker. Caffeine use Carbonated beverages, Tea.  Family History Ashley Saa, MD; 03/22/2015 4:04 PM) Alcohol Abuse Father. Hypertension Mother. Heart Disease Mother. Arthritis Father, Mother. Bleeding disorder Father. Cancer Mother.  Pregnancy / Birth History Ashley Saa, MD; 03/22/2015 4:04 PM) Contraceptive History Oral contraceptives. Gravida 3 Irregular periods Age at menarche 15 years. Maternal age 73-25 Para 3  Vitals Ashley [name] CMA; 03/22/2015 3:45 PM) 03/22/2015 3:44 PM Weight: 302.8 lb Height: 67in Body Surface Area: 2.41 m Body Mass Index: 47.42 kg/m  Temp.: 98.4F(Oral)  Pulse: 82 (Regular)  BP: 130/82 (Sitting, Left Arm, Standard)       Physical Exam Ashley [name] T. Ashley Mcclafferty MD; 03/22/2015 4:05 PM) The physical exam findings are as follows: Note:General: Morbidly obese in no distress Skin: No rash or infection HEENT: No palpable masses. No adenopathy. Sclera nonicteric Lungs: Clear equal breath sounds bilaterally without wheezing Cardiac: Regular rate and rhythm  without murmurs. Trace ankle edema. Abdomen: Soft and nontender. No mass or organomegaly Extremities: No joint swelling or inflammation. 1+ LE edema Neurologic: Alert and fully oriented. Affect normal. Gait normal.    Assessment & Plan Ashley [name] T. Ashley Ontko MD; 03/22/2015 4:08 PM) MORBID OBESITY  WITH BMI OF 45.0-49.9, ADULT (E66.01) Impression: Patient with progressive morbid obesity unresponsive to multiple efforts at medical management who presents with a BMI of 47 and comorbidities of HTn and GERD. I believe there would be very significant medical benefit from surgical weight loss. After our discussion of surgical options currently available the patient has decided to proceed with laparoscopic sleeve gastrectomy due to the reasons above. We again reviewed the consent form today and all her questions were answered. She is given prescriptions for Zofran, Protonix and pain medication  She has elevated creatinine and renal evaluation in progress. We will make sure that her nephrologist feels it is safe to proceed with surgery. Current Plans Schedule for Surgery Laparoscopic sleeve gastrectomy

## 2015-04-09 NOTE — Anesthesia Postprocedure Evaluation (Signed)
Anesthesia Post Note  Patient: Ashley [name]  Procedure(s) Performed: Procedure(s): LAPAROSCOPIC GASTRIC SLEEVE RESECTION WITH HIATAL HERNIA REPAIR UPPER GI ENDOSCOPY  Anesthesia type: general  Patient location: PACU  Post pain: Pain level controlled  Post assessment: Patient's Cardiovascular Status Stable  Last Vitals:  Filed Vitals:   04/09/15 1422  BP:   Pulse: 67  Temp:   Resp: 19    Post vital signs: Reviewed and stable  Level of consciousness: sedated  Complications: No apparent anesthesia complications

## 2015-04-09 NOTE — Op Note (Signed)
Filbert BertholdLynn D Lamp [id number] 07/05/1960 04/09/2015  Preoperative diagnosis: morbid obestiy  Postoperative diagnosis: Same   Procedure: upper endoscopy   Surgeon: Mary SellaEric M. Dashun Borre M.D., FACS   Anesthesia: Gen.   Indications for procedure: 54 year old female undergoing Laparoscopic Gastric Sleeve Resection and an EGD was requested to evaluate the new gastric sleeve.   Description of procedure: After we have completed the sleeve resection, I scrubbed out and obtained the Olympus endoscope. I gently placed endoscope in the patient's oropharynx and gently glided it down the esophagus without any difficulty under direct visualization. Once I was in the gastric sleeve, I insufflated the stomach with air. I was able to cannulate and advanced the scope through the gastric sleeve. I was able to cannulate the duodenum with ease. Dr. Johna SheriffHoxworth had placed saline in the upper abdomen. Upon further insufflation of the gastric sleeve there was no evidence of bubbles. After reduction of the hiatal hernia and repair, the Z line was about 3-4 cm below the diaphragm.   Upon further inspection of the gastric sleeve, the mucosa appeared normal. There is no evidence of any mucosal abnormality. The sleeve was widely patent at the angularis. There was no evidence of bleeding. The gastric sleeve was decompressed. The scope was withdrawn. The patient tolerated this portion of the procedure well. Please see Dr Jamse MeadHoxworth's operative note for details regarding the laparoscopic gastric sleeve resection.   Mary SellaEric M. Andrey CampanileWilson, MD, FACS  General, Bariatric, & Minimally Invasive Surgery  [facility] [place]

## 2015-04-09 NOTE — Interval H&P Note (Signed)
History and Physical Interval Note:  04/09/2015 10:40 AM  Ashley BertholdLynn D Kriegel  has presented today for surgery, with the diagnosis of Morbid Obesity  The various methods of treatment have been discussed with the patient and family. After consideration of risks, benefits and other options for treatment, the patient has consented to  Procedure(s): LAPAROSCOPIC GASTRIC SLEEVE RESECTION/REPAIR HIATAL HERNIA (N/A) as a surgical intervention .  The patient's history has been reviewed, patient examined, no change in status, stable for surgery.  I have reviewed the patient's chart and labs.  Questions were answered to the patient's satisfaction.     Oseph Imburgia T

## 2015-04-10 LAB — CBC WITH DIFFERENTIAL/PLATELET
BASOS PCT: 0 %
Basophils Absolute: 0 10*3/uL (ref 0.0–0.1)
EOS ABS: 0 10*3/uL (ref 0.0–0.7)
EOS PCT: 0 %
HCT: 38.8 % (ref 36.0–46.0)
HEMOGLOBIN: 13 g/dL (ref 12.0–15.0)
Lymphocytes Relative: 13 %
Lymphs Abs: 1.6 10*3/uL (ref 0.7–4.0)
MCH: 31.7 pg (ref 26.0–34.0)
MCHC: 33.5 g/dL (ref 30.0–36.0)
MCV: 94.6 fL (ref 78.0–100.0)
MONOS PCT: 5 %
Monocytes Absolute: 0.6 10*3/uL (ref 0.1–1.0)
NEUTROS PCT: 82 %
Neutro Abs: 10.1 10*3/uL — ABNORMAL HIGH (ref 1.7–7.7)
PLATELETS: 256 10*3/uL (ref 150–400)
RBC: 4.1 MIL/uL (ref 3.87–5.11)
RDW: 13.9 % (ref 11.5–15.5)
WBC: 12.2 10*3/uL — AB (ref 4.0–10.5)

## 2015-04-10 LAB — HEMOGLOBIN AND HEMATOCRIT, BLOOD
HEMATOCRIT: 40.2 % (ref 36.0–46.0)
HEMOGLOBIN: 13.1 g/dL (ref 12.0–15.0)

## 2015-04-10 MED ORDER — DIPHENHYDRAMINE HCL 12.5 MG/5ML PO ELIX
25.0000 mg | ORAL_SOLUTION | Freq: Four times a day (QID) | ORAL | Status: DC | PRN
  Administered 2015-04-10: 25 mg via ORAL
  Filled 2015-04-10: qty 10

## 2015-04-10 NOTE — Progress Notes (Signed)
Patient alert and oriented, Post op day 1.  Provided support and encouragement.  Encouraged pulmonary toilet, ambulation and small sips of liquids.  All questions answered.  Will continue to monitor. 

## 2015-04-10 NOTE — Progress Notes (Signed)
Pt c/o pruritis of back and of feet.  Examination of same reveals no erythema or other signs of inflammation.  Pt requested Benadryl; MD notified.

## 2015-04-10 NOTE — Plan of Care (Signed)
Problem: Food- and Nutrition-Related Knowledge Deficit (NB-1.1) Goal: Nutrition education Formal process to instruct or train a patient/client in a skill or to impart knowledge to help patients/clients voluntarily manage or modify food choices and eating behavior to maintain or improve health. Outcome: Completed/Met Date Met:  04/10/15 Nutrition Education Note  Received consult for diet education per DROP protocol.   Discussed 2 week post op diet with pt. Emphasized that liquids must be non carbonated, non caffeinated, and sugar free. Fluid goals discussed. Pt to follow up with outpatient bariatric RD for further diet progression after 2 weeks. Multivitamins and minerals also reviewed. Teach back method used, pt expressed understanding, expect good compliance.   Diet: First 2 Weeks  You will see the nutritionist about two (2) weeks after your surgery. The nutritionist will increase the types of foods you can eat if you are handling liquids well:  If you have severe vomiting or nausea and cannot handle clear liquids lasting longer than 1 day, call your surgeon  Protein Shake  Drink at least 2 ounces of shake 5-6 times per day  Each serving of protein shakes (usually 8 - 12 ounces) should have a minimum of:  15 grams of protein  And no more than 5 grams of carbohydrate  Goal for protein each day:  Men = 80 grams per day  Women = 60 grams per day  Protein powder may be added to fluids such as non-fat milk or Lactaid milk or Soy milk (limit to 35 grams added protein powder per serving)   Hydration  Slowly increase the amount of water and other clear liquids as tolerated (See Acceptable Fluids)  Slowly increase the amount of protein shake as tolerated  Sip fluids slowly and throughout the day  May use sugar substitutes in small amounts (no more than 6 - 8 packets per day; i.e. Splenda)   Fluid Goal  The first goal is to drink at least 8 ounces of protein shake/drink per day (or as directed  by the nutritionist); some examples of protein shakes are Syntrax Nectar, Adkins Advantage, EAS Edge HP, and Unjury. See handout from pre-op Bariatric Education Class:  Slowly increase the amount of protein shake you drink as tolerated  You may find it easier to slowly sip shakes throughout the day  It is important to get your proteins in first  Your fluid goal is to drink 64 - 100 ounces of fluid daily  It may take a few weeks to build up to this  32 oz (or more) should be clear liquids  And  32 oz (or more) should be full liquids (see below for examples)  Liquids should not contain sugar, caffeine, or carbonation   Clear Liquids:  Water or Sugar-free flavored water (i.e. Fruit H2O, Propel)  Decaffeinated coffee or tea (sugar-free)  Crystal Lite, Wyler's Lite, Minute Maid Lite  Sugar-free Jell-O  Bouillon or broth  Sugar-free Popsicle: *Less than 20 calories each; Limit 1 per day   Full Liquids:  Protein Shakes/Drinks + 2 choices per day of other full liquids  Full liquids must be:  No More Than 12 grams of Carbs per serving  No More Than 3 grams of Fat per serving  Strained low-fat cream soup  Non-Fat milk  Fat-free Lactaid Milk  Sugar-free yogurt (Dannon Lite & Fit, Greek yogurt)     Junius Faucett, MS, RD, LDN Pager: [phone] After Hours Pager: [phone]        

## 2015-04-10 NOTE — Progress Notes (Signed)
Patient ID: Ashley [name], female   DOB: 02-Aug-1960, 54 y.o.   MRN: [id number] 1 Day Post-Op  Subjective: Doing well. Mild abdominal discomfort. No nausea this morning. Has been up ambulating.  Objective: Vital signs in last 24 hours: Temp:  [97.2 F (36.2 C)-98.3 F (36.8 C)] 98.1 F (36.7 C) (11/16 0510) Pulse Rate:  [62-92] 92 (11/16 0510) Resp:  [13-23] 15 (11/16 0510) BP: (130-177)/(66-107) 137/66 mmHg (11/16 0510) SpO2:  [96 %-100 %] 98 % (11/16 0510) Weight:  [129.502 kg (285 lb 8 oz)] 129.502 kg (285 lb 8 oz) (11/15 0939) Last BM Date: 04/09/15  Intake/Output from previous day: 11/15 0701 - 11/16 0700 In: 2793.3 [I.V.:2793.3] Out: 525 [Urine:525] Intake/Output this shift:    General appearance: alert, cooperative and no distress Resp: clear to auscultation bilaterally GI: normal findings: soft, non-tender Incision/Wound: clean and dry  Lab Results:   Recent Labs  04/09/15 1820 04/10/15 0407  WBC  --  12.2*  HGB 15.1* 13.0  HCT 44.5 38.8  PLT  --  256   BMET No results for input(s): NA, K, CL, CO2, GLUCOSE, BUN, CREATININE, CALCIUM in the last 72 hours.   Studies/Results: No results found.  Anti-infectives: Anti-infectives    Start     Dose/Rate Route Frequency Ordered Stop   04/09/15 0929  cefOXitin (MEFOXIN) 2 g in dextrose 5 % 50 mL IVPB     2 g 100 mL/hr over 30 Minutes Intravenous On call to O.R. 04/09/15 0929 04/09/15 1118      Assessment/Plan: s/p Procedure(s): LAPAROSCOPIC GASTRIC SLEEVE RESECTION WITH HIATAL HERNIA REPAIR UPPER GI ENDOSCOPY Doing well without apparent complication. Begin postoperative day #1 diet. Ambulation encouraged.   LOS: 1 day    Nkosi Cortright T 04/10/2015

## 2015-04-10 NOTE — Progress Notes (Signed)
Utilization review completed.  

## 2015-04-11 LAB — CBC WITH DIFFERENTIAL/PLATELET
BASOS PCT: 0 %
Basophils Absolute: 0 10*3/uL (ref 0.0–0.1)
EOS ABS: 0.1 10*3/uL (ref 0.0–0.7)
EOS PCT: 1 %
HCT: 37.8 % (ref 36.0–46.0)
HEMOGLOBIN: 12.2 g/dL (ref 12.0–15.0)
LYMPHS PCT: 39 %
Lymphs Abs: 3.5 10*3/uL (ref 0.7–4.0)
MCH: 31.7 pg (ref 26.0–34.0)
MCHC: 32.3 g/dL (ref 30.0–36.0)
MCV: 98.2 fL (ref 78.0–100.0)
Monocytes Absolute: 0.7 10*3/uL (ref 0.1–1.0)
Monocytes Relative: 8 %
NEUTROS PCT: 52 %
Neutro Abs: 4.6 10*3/uL (ref 1.7–7.7)
Platelets: 228 10*3/uL (ref 150–400)
RBC: 3.85 MIL/uL — AB (ref 3.87–5.11)
RDW: 14.4 % (ref 11.5–15.5)
WBC: 8.9 10*3/uL (ref 4.0–10.5)

## 2015-04-11 MED ORDER — HYDROCODONE-ACETAMINOPHEN 7.5-325 MG/15ML PO SOLN: 15.0000 mL | Freq: Four times a day (QID) | ORAL | Status: DC | PRN

## 2015-04-11 NOTE — Progress Notes (Signed)
Patient alert and oriented, pain is controlled. Patient is tolerating fluids,  advanced to protein shake today, patient tolerated well.  Reviewed Gastric sleeve discharge instructions with patient and patient is able to articulate understanding.  Provided information on BELT program, Support Group and [facility] outpatient pharmacy. All questions answered, will continue to monitor.  

## 2015-04-11 NOTE — Progress Notes (Signed)
Patient ID: Ashley [name], female   DOB: Aug 13, 1960, 54 y.o.   MRN: [id number] 2 Days Post-Op  Subjective: Throat is sore with swallowing.Tolerating water without pain or nausea. Just sore when moving around. Had some itching with oxycodone.  Objective: Vital signs in last 24 hours: Temp:  [97.5 F (36.4 C)-98.7 F (37.1 C)] 98.7 F (37.1 C) (11/17 0602) Pulse Rate:  [64-88] 75 (11/17 0602) Resp:  [15-18] 18 (11/17 0602) BP: (127-150)/(55-81) 141/81 mmHg (11/17 0602) SpO2:  [96 %-100 %] 98 % (11/17 0602) Last BM Date: 04/08/15  Intake/Output from previous day: 11/16 0701 - 11/17 0700 In: 2297.9 [I.V.:2297.9] Out: 800 [Urine:800] Intake/Output this shift:    General: Comfortable in no distress Abdomen: Soft and nontender Incisions: Clean and dry  Lab Results:   Recent Labs  04/10/15 0407 04/10/15 1616 04/11/15 0520  WBC 12.2*  --  8.9  HGB 13.0 13.1 12.2  HCT 38.8 40.2 37.8  PLT 256  --  228   BMET No results for input(s): NA, K, CL, CO2, GLUCOSE, BUN, CREATININE, CALCIUM in the last 72 hours.   Studies/Results: No results found.  Anti-infectives: Anti-infectives    Start     Dose/Rate Route Frequency Ordered Stop   04/09/15 0929  cefOXitin (MEFOXIN) 2 g in dextrose 5 % 50 mL IVPB     2 g 100 mL/hr over 30 Minutes Intravenous On call to O.R. 04/09/15 0929 04/09/15 1118      Assessment/Plan: s/p Procedure(s): LAPAROSCOPIC GASTRIC SLEEVE RESECTION WITH HIATAL HERNIA REPAIR UPPER GI ENDOSCOPY Doing well postoperatively without apparent complication. Start protein shakes and anticipate discharge later today. I will give her prescription for hydrocodone and oxycodone causing itching   LOS: 2 days    Fleming Prill T 04/11/2015

## 2015-04-11 NOTE — Discharge Summary (Signed)
   Patient ID: Ashley [name] [id number] 54 y.o. Jun 06, 1960  04/09/2015  Discharge date and time: 04/11/2015   Admitting Physician: Glenna FellowsHOXWORTH,Judie Hollick T  Discharge Physician: Glenna FellowsHOXWORTH,Chett Taniguchi T  Admission Diagnoses: Morbid Obesity  Discharge Diagnoses: same  Operations: Procedure(s): LAPAROSCOPIC GASTRIC SLEEVE RESECTION WITH HIATAL HERNIA REPAIR UPPER GI ENDOSCOPY  Admission Condition: good  Discharged Condition: good  Indication for Admission: patient has progressive morbid obesity unresponsive to medical management BMI of 48 and comorbidities of hypertension, GERD and chronic joint pain. After extensive preoperative workup and discussion  detailed elsewhere she is electively admitted for laparoscopic sleeve gastrectomy  Hospital Course: n the morning of admission the patient underwent an uneventful laparoscopic sleeve gastrectomy. Her postoperative course was uncomplicated. Her CBC remained normal. She is able to tolerate water on the first postoperative day.Pain was easily managed. By the second postoperative day she is ambulatory. Tolerating protein shakes. Abdomen is benign. Wounds healing well.   Disposition: Home  Patient Instructions:    Medication List    TAKE these medications        acidophilus Caps capsule  Take by mouth daily. Patient takes probiotic daily     CALCIUM CITRATE PO  Take 1 tablet by mouth 2 (two) times daily.     Cyanocobalamin 500 MCG Subl  Place 500 mcg under the tongue daily.     flintstones complete 60 MG chewable tablet  Chew 1 tablet by mouth daily.     fluticasone 50 MCG/ACT nasal spray  Commonly known as:  FLONASE  Place 1 spray into the nose daily.     gabapentin 300 MG capsule  Commonly known as:  NEURONTIN  Take 1 capsule (300 mg total) by mouth at bedtime as needed.     hydrochlorothiazide 12.5 MG capsule  Commonly known as:  MICROZIDE  Take 12.5 mg by mouth daily. Last dose 04/03/2015     HYDROcodone-acetaminophen  7.5-325 mg/15 ml solution  Commonly known as:  HYCET  Take 15 mLs by mouth 4 (four) times daily as needed for moderate pain.     hydrOXYzine 50 MG tablet  Commonly known as:  ATARAX/VISTARIL  Take 50 mg by mouth every 8 (eight) hours as needed for itching.     montelukast 10 MG tablet  Commonly known as:  SINGULAIR  Take 10 mg by mouth at bedtime.     propranolol ER 80 MG 24 hr capsule  Commonly known as:  INDERAL LA  TAKE 1 CAPSULE (80 MG TOTAL) BY MOUTH DAILY.     ranitidine 150 MG tablet  Commonly known as:  ZANTAC  Take 150 mg by mouth 2 (two) times daily as needed for heartburn.     spironolactone-hydrochlorothiazide 25-25 MG tablet  Commonly known as:  ALDACTAZIDE  Take 1 tablet by mouth daily. 1/2 tablet every day        Activity: activity as tolerated Diet: bariatric protein shakes Wound Care: none needed  Follow-up:  With Dr. Johna SheriffHoxworth in 3 weeks.  Signed: Mariella SaaBenjamin T Ismael Treptow MD, FACS  04/11/2015, 8:28 AM

## 2015-04-11 NOTE — Discharge Instructions (Signed)

## 2015-04-15 ENCOUNTER — Telehealth (HOSPITAL_COMMUNITY): Payer: Self-pay

## 2015-04-15 NOTE — Telephone Encounter (Signed)
Attempted Drop Discharge call, no answer, left message to return call   Made discharge phone call to patient per DROP protocol. Asking the following questions.    1. Do you have someone to care for you now that you are home?   2. Are you having pain now that is not relieved by your pain medication?   3. Are you able to drink the recommended daily amount of fluids (48 ounces minimum/day) and protein (60-80 grams/day) as prescribed by the dietitian or nutritional counselor?   4. Are you taking the vitamins and minerals as prescribed?   5. Do you have the "on call" number to contact your surgeon if you have a problem or question?   6. Are your incisions free of redness, swelling or drainage? (If steri strips, address that these can fall off, shower as tolerated)  7. Have your bowels moved since your surgery?  If not, are you passing gas?   8. Are you up and walking 3-4 times per day?       

## 2015-04-16 ENCOUNTER — Ambulatory Visit: Payer: 59

## 2015-04-23 ENCOUNTER — Encounter: Payer: 59 | Attending: General Surgery

## 2015-04-23 VITALS — Ht 67.0 in | Wt 276.0 lb

## 2015-04-23 DIAGNOSIS — Z6841 Body Mass Index (BMI) 40.0 and over, adult: Secondary | ICD-10-CM | POA: Insufficient documentation

## 2015-04-23 DIAGNOSIS — Z713 Dietary counseling and surveillance: Secondary | ICD-10-CM | POA: Insufficient documentation

## 2015-04-23 NOTE — Progress Notes (Signed)
Bariatric Class:  Appt start time: 1530 end time:  1630.  2 Week Post-Operative Nutrition Class  Patient was seen on 04/23/15 for Post-Operative Nutrition education [facility].   Surgery date: 04/01/15 Surgery type: Sleeve gastrectomy Start weight at [facility]: 299 lbs on 12/13/14 Weight today: 276.0 lbs  Weight change: 16 lbs Total weight loss: 23 lbs  TANITA  BODY COMP RESULTS  03/04/15 04/23/15   BMI (kg/m^2) 45.7 43.2   Fat Mass (lbs) 164 147.0   Fat Free Mass (lbs) 128 129.0   Total Body Water (lbs) 93.5 94.5    The following the learning objectives were met by the patient during this course:  Identifies Phase 3A (Soft, High Proteins) Dietary Goals and will begin from 2 weeks post-operatively to 2 months post-operatively  Identifies appropriate sources of fluids and proteins   States protein recommendations and appropriate sources post-operatively  Identifies the need for appropriate texture modifications, mastication, and bite sizes when consuming solids  Identifies appropriate multivitamin and calcium sources post-operatively  Describes the need for physical activity post-operatively and will follow MD recommendations  States when to call healthcare provider regarding medication questions or post-operative complications  Handouts given during class include:  Phase 3A: Soft, High Protein Diet Handout  Follow-Up Plan: Patient will follow-up at [facility] in 6 weeks for 2 month post-op nutrition visit for diet advancement per MD.

## 2015-06-10 ENCOUNTER — Encounter: Payer: 59 | Attending: General Surgery | Admitting: Dietician

## 2015-06-10 ENCOUNTER — Encounter: Payer: Self-pay | Admitting: Dietician

## 2015-06-10 DIAGNOSIS — Z6841 Body Mass Index (BMI) 40.0 and over, adult: Secondary | ICD-10-CM | POA: Insufficient documentation

## 2015-06-10 DIAGNOSIS — Z713 Dietary counseling and surveillance: Secondary | ICD-10-CM | POA: Insufficient documentation

## 2015-06-10 NOTE — Progress Notes (Signed)
  Follow-up visit:  8 Weeks Post-Operative Sleeve gastrectomy Surgery  Medical Nutrition Therapy:  Appt start time: 320 end time:  400.  Primary concerns today: Post-operative Bariatric Surgery Nutrition Management.  Ashley [name] returns today having lost 13 lbs of fat since post op class. She is tolerating all recommended foods. Meeting protein needs with frequent meals and snacks. However, having trouble meeting fluid needs.  Samples provided and patient instructed on proper use: PB2 (qty 2) Lot#: [id number] Exp: 02/2017  Surgery date: 04/01/15 Surgery type: Sleeve gastrectomy [facility]: 299 lbs on 12/13/14 (highest weight 302 lbs per patient) Weight today: 267.5 lbs Weight change: 8.5 lbs Total weight loss: 34.5 lbs  TANITA  BODY COMP RESULTS  03/04/15 04/23/15 06/10/15   BMI (kg/m^2) 45.7 43.2 41.9   Fat Mass (lbs) 164 147.0 134   Fat Free Mass (lbs) 128 129.0 133.5   Total Body Water (lbs) 93.5 94.5 97.5    Preferred Learning Style:   No preference indicated   Learning Readiness:   Ready  24-hr recall: B (AM): Premier protein shake (30g) Snk (10-10:30AM): cheese stick (6g)  L (12:30-1PM): 1/4 cup chicken salad (7g) Snk (PM): Triple zero yogurt (15g)  Snk (4:30PM): cheese stick (6g) D PM: Malaw[place]urkey patty with cheese OR 3 oz baked chicken OR lean meatloaf (14g) Snk (PM):   Fluid intake: 34 oz water with flavoring, 11 oz protein shake, 6 oz hot decaf tea Estimated total protein intake: 78 g per day  Medications: see list Supplementation: taking  Using straws: no Drinking while eating: no Hair loss: no Carbonated beverages: none N/V/D/C: regurgitation 1x from overeating; constipation (taking laxative per surgeon) Dumping syndrome: none  Recent physical activity:  Walking as tolerated about 3x a week  Progress Towards Goal(s):  In progress.  Handouts given during visit include:  Phase 3B lean protein + non starchy vegetables   Nutritional Diagnosis:   [place]-3.3 Overweight/obesity related to past poor dietary habits and physical inactivity as evidenced by patient w/ recent sleeve gastrectomy surgery following dietary guidelines for continued weight loss.     Intervention:  Nutrition counseling provided.  Teaching Method Utilized:  Visual Auditory Hands on  Barriers to learning/adherence to lifestyle change: none  Demonstrated degree of understanding via:  Teach Back   Monitoring/Evaluation:  Dietary intake, exercise, and body weight. Follow up in 2 months for 4 month post-op visit.

## 2015-06-10 NOTE — Patient Instructions (Addendum)
Goals:  Follow Phase 3B: High Protein + Non-Starchy Vegetables  Eat 3-6 small meals/snacks, every 3-5 hrs  Increase lean protein foods to meet 60g goal  Increase fluid intake to 64oz +  Avoid drinking 15 minutes before, during and 30 minutes after eating  Aim for >30 min of physical activity daily  Surgery date: 04/01/15 Surgery type: Sleeve gastrectomy [facility]: 299 lbs on 12/13/14 (highest weight 302 lbs per patient) Weight today: 267.5 lbs Weight change: 8.5 lbs Total weight loss: 34.5 lbs  TANITA  BODY COMP RESULTS  03/04/15 04/23/15 06/10/15   BMI (kg/m^2) 45.7 43.2 41.9   Fat Mass (lbs) 164 147.0 134   Fat Free Mass (lbs) 128 129.0 133.5   Total Body Water (lbs) 93.5 94.5 97.5

## 2015-07-05 ENCOUNTER — Ambulatory Visit: Payer: 59 | Admitting: Neurology

## 2015-07-15 ENCOUNTER — Encounter: Payer: Self-pay | Admitting: Neurology

## 2015-07-15 ENCOUNTER — Ambulatory Visit (INDEPENDENT_AMBULATORY_CARE_PROVIDER_SITE_OTHER): Payer: 59 | Admitting: Neurology

## 2015-07-15 VITALS — BP 120/70 | HR 72 | Ht 67.0 in | Wt 260.4 lb

## 2015-07-15 DIAGNOSIS — G5603 Carpal tunnel syndrome, bilateral upper limbs: Secondary | ICD-10-CM | POA: Diagnosis not present

## 2015-07-15 DIAGNOSIS — G250 Essential tremor: Secondary | ICD-10-CM | POA: Diagnosis not present

## 2015-07-15 NOTE — Progress Notes (Signed)
[facility]    Provider:  Dr Lucia Gaskins Referring Provider: Marguerita Merles Primary Care Physician:  Loyce Dys, PA-C  CC: Essential Tremor  Interval history 07/15/2015: The essential tremor has progressed into the other hand. The medication is still helping, doesn't want to increase. No progression into the mouth or voice. Not affecting life significantly, doing well. Discussed essential tremor, is considered benign because it does not progress to any serious disease but it can be disturbing and can affect activities of daily living. 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 She follows with a hand surgeon at [facility] and just had injections in the right hand. She is worried about parkinson's disease, reassured her that this is not PD and will not progress to PD. Discussed association with family history of which she has no family history. No side effects from the propranolol. She was also started on labetalol for blood pressure, by a kidney doctor and he is not aware she is also taking propranolol. Her blood pressure and pulse is good today, advised her to ensure he knows she is on both. Mcneil Sober, MD.  What I would prefer is that this physician stop the labetalol and increase the propranol instead of having her on 2 different beta-blockers. When she stopped the propranolol the tremors worsened even while on the labetalol. [phone] will call patient after speaking with Dr. I, left my cell phone and name with his office. She had a gastric sleeze. She has lost 42 pounds. Tremor worse with stress. Numbness and tingling in the feet resolved.   HPI: Ashley [name] is a 55 y.o. female here as a follow up for essential tremor. She is feeling better. Tremor improved on the inderal 80mg . Not bothering her, not interfering with any daily functions. She says her feet have been numb on occassion. She was sitting eating lunch and her right foot got numb to the ankles. Got better  with stretching. Has happened 3-4x in the right leg and at one point was persistent for several months. Not worse at night. No low back pain. Some foot cramps at night. She has pain in digit 2 in the hands. Previous CTS.  Visit with Dr. Hosie Poisson 03/23/2013: Tremor started years ago, saw neurologist in [facility] and then moved to this area. Was told it was essential tremor at that time, suggested she try a medication which patient reports with a seizure medication, patient refused at that time. Started in left hand, now in both but worse in left. Notes it the most when holding things, more of a postural/action tremor. Worse with stress. Has minimal tremor at rest. More of a nuisance. Not affecting daily life the No bradykinesias, no gait instability, no muscle cramping. No difficulty sleeping, some talking in her sleep. Has limited sense of smell. No change in handwriting. No known thyroid abnormalities, has been checked at PCP. Started Inderal 60mg  ER, this week. Tolerating well, has not noted any benefit. No noted effect with EtOH but drinks infrequently.   No family history of tremor a neurodegenerative process Review of Systems: Patient complains of symptoms per HPI as well as the following symptoms: No CP, no SOB. Pertinent negatives per HPI. All others negative.   Social History   Social History  . Marital Status: Married    Spouse Name: Colon Branch   . Number of Children: 3  . Years of Education: 12+   Occupational History  .     Social History Main Topics  .  Smoking status: Never Smoker   . Smokeless tobacco: Never Used  . Alcohol Use: No  . Drug Use: No  . Sexual Activity: Not on file   Other Topics Concern  . Not on file   Social History Narrative   Patient is Married to [place].    Patient works full-time.   Patient has 3 children.    Patient has some college.           Family History  Problem Relation Age of Onset  . Colon cancer Neg Hx   . Esophageal cancer Neg Hx   .  Rectal cancer Neg Hx   . Stomach cancer Neg Hx     Past Medical History  Diagnosis Date  . Hypertension   . Seasonal allergies   . Essential tremor   . Palpitations   . History of cholecystectomy   . Neuropathy (HCC)   . GERD (gastroesophageal reflux disease)     Past Surgical History  Procedure Laterality Date  . Carpal tunnel release Right 2005    left 2011  . Cesarean section  1989  . Heel spur surgery Left   . Cholecystectomy  2008  . History of neuroplasty decompression median nerve at carpal tunnel    . Laparoscopic gastric sleeve resection with hiatal hernia repair  04/09/2015    Procedure: LAPAROSCOPIC GASTRIC SLEEVE RESECTION WITH HIATAL HERNIA REPAIR;  Surgeon: Glenna Fellows, MD;  Location: [facility];  Service: General;;  . Upper gi endoscopy  04/09/2015    Procedure: UPPER GI ENDOSCOPY;  Surgeon: Glenna Fellows, MD;  Location: [facility];  Service: General;;    Current Outpatient Prescriptions  Medication Sig Dispense Refill  . bisacodyl (DULCOLAX) 10 MG suppository Place 10 mg rectally as needed for moderate constipation.    Marland Kitchen CALCIUM CITRATE PO Take 1 tablet by mouth 2 (two) times daily.    . Cyanocobalamin 500 MCG SUBL Place 500 mcg under the tongue daily.    . cyclobenzaprine (FLEXERIL) 10 MG tablet Take 10 mg by mouth 3 (three) times daily as needed for muscle spasms.    . flintstones complete (FLINTSTONES) 60 MG chewable tablet Chew 1 tablet by mouth daily.    . fluticasone (FLONASE) 50 MCG/ACT nasal spray Place 1 spray into the nose daily.     Marland Kitchen gabapentin (NEURONTIN) 300 MG capsule Take 1 capsule (300 mg total) by mouth at bedtime as needed. (Patient taking differently: Take 300 mg by mouth at bedtime as needed (neuropathy). ) 90 capsule 2  . hydrochlorothiazide (MICROZIDE) 12.5 MG capsule Take 12.5 mg by mouth daily. Last dose 04/03/2015    . hydrOXYzine (ATARAX/VISTARIL) 50 MG tablet Take 50 mg by mouth every 8 (eight) hours as needed for itching.     .  labetalol (NORMODYNE) 100 MG tablet Take 100 mg by mouth 2 (two) times daily.    . montelukast (SINGULAIR) 10 MG tablet Take 10 mg by mouth at bedtime.    . propranolol ER (INDERAL LA) 80 MG 24 hr capsule TAKE 1 CAPSULE (80 MG TOTAL) BY MOUTH DAILY. 90 capsule 3  . ranitidine (ZANTAC) 150 MG tablet Take 150 mg by mouth 2 (two) times daily as needed for heartburn.     . spironolactone-hydrochlorothiazide (ALDACTAZIDE) 25-25 MG tablet Take 1 tablet by mouth daily. Reported on 06/10/2015, takes 1/2 tablet     No current facility-administered medications for this visit.    Allergies as of 07/15/2015 - Review Complete 07/15/2015  Allergen Reaction Noted  . Azithromycin  Itching 03/29/2015  . Chocolate Hives 03/22/2013  . Mobic [meloxicam]  12/28/2012  . Neomycin Hives 03/22/2013  . Neosporin [neomycin-bacitracin zn-polymyx] Hives 03/22/2013  . Niacin and related  12/28/2012  . Strawberry extract Hives 03/22/2013  . Erythromycin Hives 01/27/2013    Vitals: BP 120/70 mmHg  Pulse 72  Ht  (1.702 m)  Wt 260 lb 6.4 oz (118.117 kg)  BMI 40.77 kg/m2 Last Weight:  Wt Readings from Last 1 Encounters:  07/15/15 260 lb 6.4 oz (118.117 kg)   Last Height:   Ht Readings from Last 1 Encounters:  07/15/15  (1.702 m)       Speech:  Speech is normal; fluent and spontaneous with normal comprehension.  Cognition:  The patient is oriented to person, place, and time;   recent and remote memory intact;   language fluent;   normal attention, concentration,   fund of knowledge Cranial Nerves:  The pupils are equal, round, and reactive to light. Visual fields are full to finger confrontation. Extraocular movements are intact. Trigeminal sensation is intact and the muscles of mastication are normal. The face is symmetric. The palate elevates in the midline. Hearing intact. Voice is normal. Shoulder shrug is normal. The tongue has normal motion without fasciculations.    Coordination:  No dysmetria   Motor Observation:  Postural action and mild intention tremor right > left  Tone:  Normal muscle tone.   Posture:  Posture is normal.   Strength:  Strength is V/V in the upper and lower limbs.    Sensation: intact to pin prick, vibration and proprioception distally in the feet.   Reflex Exam:  DTR's:  Deep tendon reflexes achilles 2+ bilat  Toes:  The toes are downgoing bilaterally.  Clonus:  Clonus is absent.   Assessment/Plan: 55 year old woman presenting for follow up evaluation of tremor, characterized by bilateral hand postural and action tremor. Physical exam otherwise unremarkable. Based on history and clinical symptoms is most consistent with a diagnosis of essential tremor. Patient currently on Inderal extended release 80 mg daily, will continue.   Also advised that she wear wrist braces at night and conservative measures for pain and tingling in digits 1-3 of hands possibly from CTS. Follow up with her orthopaedic.  Numbness and tingling in the feet resolved. Neuropathy labs ;ast year unremarkable.   Will discuss with Mcneil Sober, MD about labetalol vs propranolol and clal patient later today. Left my cell phone with his office.    Ashley Dean, MD  [facility] [address] [place], [place] [address]  Phone [phone] Fax [phone]  A total of 25 minutes was spent face-to-face with this patient. Over half this time was spent on counseling patient on the CTS,Essential tremor and neuropathy diagnosis and different diagnostic and therapeutic options available.

## 2015-07-17 ENCOUNTER — Telehealth: Payer: Self-pay | Admitting: Neurology

## 2015-07-17 NOTE — Telephone Encounter (Signed)
Patient called to advise when she was seen by Dr. Lucia Gaskins on 07/15/15, Dr. Lucia Gaskins was waiting to hear back from her Doctor at [facility] before possibly increasing propranolol ER (INDERAL LA) 80 MG 24 hr capsule. Patient states she still hasn't heard anything. Please call to advise.

## 2015-07-17 NOTE — Telephone Encounter (Signed)
Called pt back. Advised I will have to check with Dr Lucia Gaskins and give her a call back. Advised Dr Lucia Gaskins covering in the hospital for the rest of the week. She verbalized understanding.

## 2015-07-18 NOTE — Telephone Encounter (Signed)
Please call patient. Doctor wanted her to continue the Labetalol and stop the Propranolol. If her tremor worsens, he would like [place] to use something different to treat her tremors. She is welcome to talk to her doctor as well. I can try something different than propranolol for her tremor. Let me know what she wants to do thanks

## 2015-07-19 NOTE — Telephone Encounter (Signed)
Called and relayed message to pt per Dr Lucia Gaskins note. Pt verbalized understanding. She states she is not going to see her Dr until July. I advised she can always call their office to discuss. She verbalized understanding. She knows to call if her tremor worsens.

## 2015-08-14 ENCOUNTER — Telehealth: Payer: Self-pay | Admitting: Neurology

## 2015-08-14 ENCOUNTER — Other Ambulatory Visit: Payer: Self-pay | Admitting: Neurology

## 2015-08-14 MED ORDER — PRIMIDONE 50 MG PO TABS: 50.0000 mg | ORAL_TABLET | Freq: Every day | ORAL | Status: DC

## 2015-08-14 NOTE — Telephone Encounter (Signed)
Called pt and relayed Dr Trevor MaceAhern's message below. Went over [employer] and advised to stop medication and call if she experiences any. She verbalized understanding. Advised rx already sent to pharmacy.

## 2015-08-14 NOTE — Telephone Encounter (Signed)
Patient called to advise, tremors are worse since discontinuing propranolol ER (INDERAL LA) 80 MG 24 hr capsule.

## 2015-08-14 NOTE — Telephone Encounter (Signed)
We can try primidone. Start with 1/2 tablet at bedtime. Can increase to a whole tablet if needed. The most comon side effect is sedation, can make you very tired. Other things to watch for is ataxia, vertigo, nausea, vomiting, fatigue, drowsiness plus other side effects. Stop for anything concerning. Already called it in.

## 2015-08-14 NOTE — Telephone Encounter (Signed)
Dr Lucia GaskinsAhern- please advise Called pt back. Advised Dr Lucia GaskinsAhern seeing pt. I will try and call her back by end of today to advise. She verbalized understanding.  She would like to try another medication as previously discussed. She states tremors have worsened. Verified pharmacy.

## 2015-08-15 ENCOUNTER — Ambulatory Visit: Payer: 59 | Admitting: Dietician

## 2015-08-15 NOTE — Telephone Encounter (Addendum)
Called pt back. Explained that primidone can also be used for tremors and not just seizures. Pt stated "On my paper it says it is only used for seizures" I advised Dr Lucia GaskinsAhern uses this medication commonly for tremors. She understands but wants to possibly go back to propranolol. I advised that back on 07/18/15 Dr Lucia GaskinsAhern has spoke to her other Dr and they wanted her to stay on labetalol and quit taking propranolol. She stated "my blood pressure has been fine and I am hoping to go off of the labetalol". She requests to speak to Dr Lucia GaskinsAhern. I advised Dr Lucia GaskinsAhern is seeing pt until 4pm and it may not be until after when she can call her. She stated if it is after 4:30pm, to have Dr Lucia GaskinsAhern call her cell number [phone].

## 2015-08-15 NOTE — Telephone Encounter (Signed)
Left 2 messages for patient today. Her physician, Mcneil SoberBenjamin Igwemezie MD nephrology and internal medicine, wants patient on Labetalol which she feels does not help her tremor as much as propranolol. I spoke with this physician and he insists on Labetalol and feels that we should start a second medicine for essential tremor. I advise primidone. thanks

## 2015-08-15 NOTE — Telephone Encounter (Addendum)
Pt called stating she does not want to take primidone because it is a seizure medication, can she take propranolol instead? Please call and advise [phone]- work

## 2015-08-19 NOTE — Telephone Encounter (Signed)
Left message again. Asked her to call back or come to the office for appointment since I can't reach her. thanks

## 2015-08-27 ENCOUNTER — Encounter: Payer: 59 | Attending: General Surgery | Admitting: Dietician

## 2015-08-27 ENCOUNTER — Encounter: Payer: Self-pay | Admitting: Dietician

## 2015-08-27 DIAGNOSIS — Z713 Dietary counseling and surveillance: Secondary | ICD-10-CM | POA: Insufficient documentation

## 2015-08-27 DIAGNOSIS — Z6841 Body Mass Index (BMI) 40.0 and over, adult: Secondary | ICD-10-CM | POA: Diagnosis not present

## 2015-08-27 NOTE — Progress Notes (Signed)
  Follow-up visit:  5 Months Post-Operative Sleeve gastrectomy Surgery  Medical Nutrition Therapy:  Appt start time: 505 end time:  530  Primary concerns today: Post-operative Bariatric Surgery Nutrition Management.  Ashley [name] returns today having lost 17.5 lbs of fat since January. She is tolerating all recommended foods. Meeting protein needs with frequent meals and snacks. However, having trouble meeting fluid needs.  Has pain in her thumbs due to degenerative joint disease. Would like to use weight to work her arms when thumbs feel better.   Samples provided and patient instructed on proper use: none  Surgery date: 04/01/15 Surgery type: Sleeve gastrectomy [facility]: 299 lbs on 12/13/14 (highest weight 302 lbs per patient) Weight today: 250 lbs  Weight change: 17.5 lbs Total weight loss: 52 lbs  TANITA  BODY COMP RESULTS  03/04/15 04/23/15 06/10/15 08/27/15   BMI (kg/m^2) 45.7 43.2 41.9 39.2   Fat Mass (lbs) 164 147.0 134 123.5   Fat Free Mass (lbs) 128 129.0 133.5 126.5   Total Body Water (lbs) 93.5 94.5 97.5 92.5    Preferred Learning Style:   No preference indicated   Learning Readiness:   Ready  24-hr recall: B (AM): 1-2 eggs with cheese (10-20g) Snk (10-10:30AM): cheese stick (6g)  L (12:30-1PM): 3 oz Malaw[place]urkey burger with cheese or chicken (24g) Snk (PM): Triple zero yogurt or cheese stick (6-15g)  D PM: [place]key patty with cheese OR 3 oz baked chicken OR lean meatloaf with green beans or broccoli (21g) Snk (PM):  None  16 gram of protein from milk   Fluid intake: 34 oz water with flavoring, 16 oz 1% milk, 60 oz  Estimated total protein intake:  ~76g per day  Medications: see list Supplementation: taking  Using straws: no Drinking while eating: no Hair loss: no Carbonated beverages: none N/V/D/C: none Dumping syndrome: none  Recent physical activity:  Walking as tolerated about 3 x a week for 60 minutes  Progress Towards Goal(s):  In  progress.  Handouts given during visit include:  none   Nutritional Diagnosis:  [place]-3.3 Overweight/obesity related to past poor dietary habits and physical inactivity as evidenced by patient w/ recent sleeve gastrectomy surgery following dietary guidelines for continued weight loss.     Intervention:  Nutrition counseling provided.  Teaching Method Utilized:  Visual Auditory Hands on  Barriers to learning/adherence to lifestyle change: none  Demonstrated degree of understanding via:  Teach Back   Monitoring/Evaluation:  Dietary intake, exercise, and body weight. Follow up in 3 months for 8 month post-op visit.

## 2015-08-27 NOTE — Patient Instructions (Addendum)
Goals:  Follow Phase 3B: High Protein + Non-Starchy Vegetables  Eat 3-6 small meals/snacks, every 3-5 hrs  Increase lean protein foods to meet 60g goal  Increase fluid intake to 64oz +  Avoid drinking 15 minutes before, during and 30 minutes after eating  Aim for >30 min of physical activity daily  Eat protein first, then vegetables, and have fruit/pea after every once in a while  Look to add arm exercises when you can  Look to walk in the [employer] Association 5k   Surgery date: 04/01/15 Surgery type: Sleeve gastrectomy [facility]: 299 lbs on 12/13/14 (highest weight 302 lbs per patient) Weight today: 250 lbs  Weight change: 17.5 lbs Total weight loss: 52 lbs  TANITA  BODY COMP RESULTS  03/04/15 04/23/15 06/10/15 08/27/15   BMI (kg/m^2) 45.7 43.2 41.9 39.2   Fat Mass (lbs) 164 147.0 134 123.5   Fat Free Mass (lbs) 128 129.0 133.5 126.5   Total Body Water (lbs) 93.5 94.5 97.5 92.5

## 2015-11-07 ENCOUNTER — Other Ambulatory Visit: Payer: Self-pay | Admitting: Neurology

## 2015-11-07 ENCOUNTER — Encounter: Payer: Self-pay | Admitting: Neurology

## 2015-11-07 ENCOUNTER — Other Ambulatory Visit: Payer: Self-pay | Admitting: *Deleted

## 2015-11-07 MED ORDER — PROPRANOLOL HCL ER 80 MG PO CP24: ORAL_CAPSULE | ORAL | Status: DC

## 2015-12-03 ENCOUNTER — Encounter: Payer: 59 | Attending: General Surgery | Admitting: Dietician

## 2015-12-03 ENCOUNTER — Encounter: Payer: Self-pay | Admitting: Dietician

## 2015-12-03 DIAGNOSIS — Z713 Dietary counseling and surveillance: Secondary | ICD-10-CM | POA: Diagnosis not present

## 2015-12-03 DIAGNOSIS — Z6841 Body Mass Index (BMI) 40.0 and over, adult: Secondary | ICD-10-CM | POA: Diagnosis not present

## 2015-12-03 NOTE — Progress Notes (Signed)
  Follow-up visit:  8 Months Post-Operative Sleeve gastrectomy Surgery  Medical Nutrition Therapy:  Appt start time: 520 end time:  535  Primary concerns today: Post-operative Bariatric Surgery Nutrition Management. Ashley [name] returns today having lost 8.2 lbs and 15.5 lbs of fat mass since April. She is tolerating all recommended foods. Meeting protein needs with frequent meals and snacks. Meeting fluid needs now. Did a 5K. Pain in thumbs is better.  Samples provided and patient instructed on proper use: none  Surgery date: 04/01/15 Surgery type: Sleeve gastrectomy [facility]: 299 lbs on 12/13/14 (highest weight 302 lbs per patient) Weight today: 241.8 lbs  Weight change: 8.2 lbs Total weight loss: 60.2 lbs  TANITA  BODY COMP RESULTS  03/04/15 04/23/15 06/10/15 08/27/15 12/03/15   BMI (kg/m^2) 45.7 43.2 41.9 39.2 37.9   Fat Mass (lbs) 164 147.0 134 123.5 108.0   Fat Free Mass (lbs) 128 129.0 133.5 126.5 133.8   Total Body Water (lbs) 93.5 94.5 97.5 92.5 97.2    Preferred Learning Style:   No preference indicated   Learning Readiness:   Ready  24-hr recall: B (AM): 1 eggs with cheese (10g) Snk (10-10:30AM): yogurt triple zero (15g)  L (12:30-1PM): 3 oz Malaw[place]urkey burger with cheese or chicken (24g) Snk (PM): Triple zero yogurt or cheese stick (6-15g)  D PM: Mal[place]key patty with cheese OR 3 oz baked chicken OR lean meatloaf with green beans or broccoli (21g) Snk (PM):  None  16 gram of protein from milk   Fluid intake: 51 oz water with flavoring, 16 oz 1% milk, 60 oz  Estimated total protein intake:  ~76g per day  Medications: see list Supplementation: taking  Using straws: no Drinking while eating: no Hair loss: no Carbonated beverages: none N/V/D/C: none Dumping syndrome: none  Recent physical activity:  Walking as tolerated about 3 x a week for 60 minutes and goes to the gym 2 x week and does the bike and machines  Progress Towards Goal(s):  In progress.  Handouts  given during visit include:  none   Nutritional Diagnosis:  [place]-3.3 Overweight/obesity related to past poor dietary habits and physical inactivity as evidenced by patient w/ recent sleeve gastrectomy surgery following dietary guidelines for continued weight loss.     Intervention:  Nutrition counseling provided. Goals:  Follow Phase 3B: High Protein + Non-Starchy Vegetables  Eat 3-6 small meals/snacks, every 3-5 hrs  Increase lean protein foods to meet 60g goal  Increase fluid intake to 64oz +  Avoid drinking 15 minutes before, during and 30 minutes after eating  Aim for >30 min of physical activity daily  Eat protein first, then vegetables, and have fruit/peas after every once in a while  Teaching Method Utilized:  Visual Auditory Hands on  Barriers to learning/adherence to lifestyle change: none  Demonstrated degree of understanding via:  Teach Back   Monitoring/Evaluation:  Dietary intake, exercise, and body weight. Follow up in 3 months for 11 month post-op visit.

## 2015-12-03 NOTE — Patient Instructions (Addendum)
Goals:  Follow Phase 3B: High Protein + Non-Starchy Vegetables  Eat 3-6 small meals/snacks, every 3-5 hrs  Increase lean protein foods to meet 60g goal  Increase fluid intake to 64oz +  Avoid drinking 15 minutes before, during and 30 minutes after eating  Aim for >30 min of physical activity daily  Eat protein first, then vegetables, and have fruit/peas after every once in a while   Surgery date: 04/01/15 Surgery type: Sleeve gastrectomy [facility]: 299 lbs on 12/13/14 (highest weight 302 lbs per patient) Weight today: 241.8 lbs  Weight change: 8.2 lbs Total weight loss: 60.2 lbs  TANITA  BODY COMP RESULTS  03/04/15 04/23/15 06/10/15 08/27/15 12/03/15   BMI (kg/m^2) 45.7 43.2 41.9 39.2 37.9   Fat Mass (lbs) 164 147.0 134 123.5 108.0   Fat Free Mass (lbs) 128 129.0 133.5 126.5 133.8   Total Body Water (lbs) 93.5 94.5 97.5 92.5 97.2

## 2016-03-02 ENCOUNTER — Ambulatory Visit: Payer: 59 | Admitting: Dietician

## 2016-03-17 ENCOUNTER — Ambulatory Visit: Payer: 59 | Admitting: Dietician

## 2016-05-25 HISTORY — PX: OTHER SURGICAL HISTORY: SHX169

## 2016-06-15 DIAGNOSIS — M1811 Unilateral primary osteoarthritis of first carpometacarpal joint, right hand: Secondary | ICD-10-CM | POA: Insufficient documentation

## 2016-07-13 ENCOUNTER — Telehealth: Payer: Self-pay

## 2016-07-13 ENCOUNTER — Emergency Department (HOSPITAL_BASED_OUTPATIENT_CLINIC_OR_DEPARTMENT_OTHER)
Admission: EM | Admit: 2016-07-13 | Discharge: 2016-07-14 | Disposition: A | Discharge status: Home / Self Care | Payer: 59 | Attending: Emergency Medicine | Admitting: Emergency Medicine

## 2016-07-13 ENCOUNTER — Ambulatory Visit: Payer: 59 | Admitting: Neurology

## 2016-07-13 ENCOUNTER — Encounter (HOSPITAL_BASED_OUTPATIENT_CLINIC_OR_DEPARTMENT_OTHER): Payer: Self-pay | Admitting: Emergency Medicine

## 2016-07-13 DIAGNOSIS — H9201 Otalgia, right ear: Secondary | ICD-10-CM | POA: Diagnosis present

## 2016-07-13 DIAGNOSIS — H6991 Unspecified Eustachian tube disorder, right ear: Secondary | ICD-10-CM | POA: Insufficient documentation

## 2016-07-13 DIAGNOSIS — H6981 Other specified disorders of Eustachian tube, right ear: Secondary | ICD-10-CM

## 2016-07-13 DIAGNOSIS — I10 Essential (primary) hypertension: Secondary | ICD-10-CM | POA: Insufficient documentation

## 2016-07-13 MED ORDER — IPRATROPIUM BROMIDE 0.06 % NA SOLN: 2.0000 | Freq: Four times a day (QID) | NASAL | 12 refills | Status: DC

## 2016-07-13 NOTE — ED Triage Notes (Signed)
patient reports that she is having pain to her right ear

## 2016-07-13 NOTE — Telephone Encounter (Signed)
Pt no-showed her follow-up appt this morning. 

## 2016-07-13 NOTE — Discharge Instructions (Signed)
Follow these instructions at home:  Take over-the-counter and prescription medicines only as told by your health care provider.  Use techniques to help pop your ears as recommended by your health care provider. These may include: ? Chewing gum. ? Yawning. ? Frequent, forceful swallowing. ? Closing your mouth, holding your nose closed, and gently blowing as if you are trying to blow air out of your nose.  Do not do any of the following until your health care provider approves: ? Travel to high altitudes. ? Fly in airplanes. ? Work in a Estate agentpressurized cabin or room. ? Scuba dive.  Keep your ears dry. Dry your ears completely after showering or bathing.  Do not smoke.  Keep all follow-up visits as told by your health care provider. This is important. Contact a health care provider if:  Your symptoms do not go away after treatment.  Your symptoms come back after treatment.  You are unable to pop your ears.  You have: ? A fever. ? Pain in your ear. ? Pain in your head or neck. ? Fluid draining from your ear.  Your hearing suddenly changes.  You become very dizzy.  You lose your balance

## 2016-07-13 NOTE — ED Provider Notes (Signed)
[facility] Provider Note   CSN: [id number] Arrival date & time: 07/13/16  1953   By signing my name below, I, Ashley [name], attest that this documentation has been prepared under the direction and in the presence of Arthor CaptainAbigail Sanii Kukla, PA-C.  Electronically Signed: Octavia HeirArianna [name], ED Scribe. 07/13/16. 11:19 PM.    History   Chief Complaint Chief Complaint  Patient presents with  . Otalgia    HPI Ashley [name] is a 56 y.o. female who presents to the Emergency Department complaining of moderate, persisting right ear pain x 1 week. Pt reports the pain is a constant, stabbing sensation. She notes associated mild nasal congestion. Pt says that her ear feels very "stuffy". She expresses that yawning exacerbates her pain. She denies dental pain, sore throat, cough, and fever.  Pt also presents with a small white "bump" to her right inner eye. She expresses it has been there for the past 2 weeks. Pt says it does not give her any pain but she does state that in the morning her bottom eyelashes will become crusty. She has been applying hot compresses to the area.   The history is provided by the patient. No language interpreter was used.    Past Medical History:  Diagnosis Date  . Essential tremor   . GERD (gastroesophageal reflux disease)   . History of cholecystectomy   . Hypertension   . Neuropathy (HCC)   . Palpitations   . Seasonal allergies     Patient Active Problem List   Diagnosis Date Noted  . Morbid obesity with BMI of 45.0-49.9, adult (HCC) 04/09/2015  . CTS (carpal tunnel syndrome) 07/02/2014  . Essential tremor 03/23/2013    Past Surgical History:  Procedure Laterality Date  . CARPAL TUNNEL RELEASE Right 2005   left 2011  . CESAREAN SECTION  1989  . CHOLECYSTECTOMY  2008  . HEEL SPUR SURGERY Left   . history of neuroplasty decompression median nerve at carpal tunnel    . LAPAROSCOPIC GASTRIC SLEEVE RESECTION WITH HIATAL HERNIA REPAIR  04/09/2015   Procedure: LAPAROSCOPIC GASTRIC SLEEVE RESECTION WITH HIATAL HERNIA REPAIR;  Surgeon: Glenna FellowsBenjamin Hoxworth, MD;  Location: [facility];  Service: General;;  . UPPER GI ENDOSCOPY  04/09/2015   Procedure: UPPER GI ENDOSCOPY;  Surgeon: Glenna FellowsBenjamin Hoxworth, MD;  Location: [facility];  Service: General;;    OB History    No data available       Home Medications    Prior to Admission medications   Medication Sig Start Date End Date Taking? Authorizing Provider  bisacodyl (DULCOLAX) 10 MG suppository Place 10 mg rectally as needed for moderate constipation.    Historical Provider, MD  CALCIUM CITRATE PO Take 1 tablet by mouth 2 (two) times daily.    Historical Provider, MD  Cyanocobalamin 500 MCG SUBL Place 500 mcg under the tongue daily.    Historical Provider, MD  cyclobenzaprine (FLEXERIL) 10 MG tablet Take 10 mg by mouth 3 (three) times daily as needed for muscle spasms.    Historical Provider, MD  flintstones complete (FLINTSTONES) 60 MG chewable tablet Chew 1 tablet by mouth daily.    Historical Provider, MD  fluticasone (FLONASE) 50 MCG/ACT nasal spray Place 1 spray into the nose daily.  06/19/14 07/15/15  Historical Provider, MD  gabapentin (NEURONTIN) 300 MG capsule Take 1 capsule (300 mg total) by mouth at bedtime as needed. Patient taking differently: Take 300 mg by mouth at bedtime as needed (neuropathy).  08/31/14   Griselda MinerAntonia B  Lucia Gaskins, MD  hydrochlorothiazide (MICROZIDE) 12.5 MG capsule Take 12.5 mg by mouth daily. Last dose 04/03/2015    Historical Provider, MD  hydrOXYzine (ATARAX/VISTARIL) 50 MG tablet Take 50 mg by mouth every 8 (eight) hours as needed for itching.     Historical Provider, MD  labetalol (NORMODYNE) 100 MG tablet Take 100 mg by mouth 2 (two) times daily. Reported on 08/27/2015    Historical Provider, MD  montelukast (SINGULAIR) 10 MG tablet Take 10 mg by mouth at bedtime.    Historical Provider, MD  primidone (MYSOLINE) 50 MG tablet Take 1 tablet (50 mg total) by mouth at bedtime.  08/14/15   Anson Fret, MD  propranolol ER (INDERAL LA) 80 MG 24 hr capsule TAKE 1 CAPSULE (80 MG TOTAL) BY MOUTH DAILY. 11/07/15   Anson Fret, MD  ranitidine (ZANTAC) 150 MG tablet Take 150 mg by mouth 2 (two) times daily as needed for heartburn.     Historical Provider, MD  spironolactone-hydrochlorothiazide (ALDACTAZIDE) 25-25 MG tablet Take 1 tablet by mouth daily. Reported on 06/10/2015, takes 1/2 tablet    Historical Provider, MD    Family History Family History  Problem Relation Age of Onset  . Colon cancer Neg Hx   . Esophageal cancer Neg Hx   . Rectal cancer Neg Hx   . Stomach cancer Neg Hx     Social History Social History  Substance Use Topics  . Smoking status: Never Smoker  . Smokeless tobacco: Never Used  . Alcohol use No     Allergies   Azithromycin; Chocolate; Mobic [meloxicam]; Neomycin; Neosporin [neomycin-bacitracin zn-polymyx]; Niacin and related; Strawberry extract; and Erythromycin   Review of Systems Review of Systems  Constitutional: Negative for fever.  HENT: Positive for congestion. Negative for dental problem and sore throat.   Respiratory: Negative for cough.      Physical Exam Updated Vital Signs BP 138/60 (BP Location: Right Arm)   Pulse (!) 56   Temp 97.7 F (36.5 C) (Oral)   Resp 18   Ht 5\' 6"  (1.676 m)   Wt 236 lb (107 kg)   SpO2 99%   BMI 38.09 kg/m   Physical Exam  Constitutional: She is oriented to person, place, and time. She appears well-developed and well-nourished.  HENT:  Head: Normocephalic.  Right Ear: A middle ear effusion is present.  Left Ear: Tympanic membrane and ear canal normal.  Eyes: EOM are normal.  Neck: Normal range of motion.  Pulmonary/Chest: Effort normal.  Abdominal: She exhibits no distension.  Musculoskeletal: Normal range of motion.  Neurological: She is alert and oriented to person, place, and time.  Psychiatric: She has a normal mood and affect.  Nursing note and vitals  reviewed.    ED Treatments / Results  DIAGNOSTIC STUDIES: Oxygen Saturation is 99% on RA, normal by my interpretation.   COORDINATION OF CARE: 11:17 PM-Discussed next steps with pt. Pt verbalized understanding and is agreeable with the plan.   Labs (all labs ordered are listed, but only abnormal results are displayed) Labs Reviewed - No data to display  EKG  EKG Interpretation None       Radiology No results found.  Procedures Procedures (including critical care time)  Medications Ordered in ED Medications - No data to display   Initial Impression / Assessment and Plan / ED Course  I have reviewed the triage vital signs and the nursing notes.  Pertinent labs & imaging results that were available during my care of the patient  were reviewed by me and considered in my medical decision making (see chart for details).       Final Clinical Impressions(s) / ED Diagnoses   Final diagnoses:  None   Patient presents with otalgia and exam consistent with Eustachian tube dysfunction. No concern for acute mastoiditis, meningitis.  Patient discharged home with Atrovent nasal spray.  Advised patient to follow-up with PCP.  I have also discussed reasons to return immediately to the ER.  Patient expresses understanding and agrees with plan. Pt appears safe for discharge.  I personally performed the services described in this documentation, which was scribed in my presence. The recorded information has been reviewed and is accurate.    New Prescriptions New Prescriptions   No medications on file      Arthor Captain, PA-C 07/14/16 0042    Loren Racer, MD 07/14/16 [phone]

## 2016-07-15 ENCOUNTER — Ambulatory Visit: Payer: 59 | Admitting: Neurology

## 2016-08-28 ENCOUNTER — Other Ambulatory Visit: Payer: Self-pay | Admitting: Neurology

## 2016-09-01 ENCOUNTER — Encounter: Payer: Self-pay | Admitting: Podiatry

## 2016-09-01 ENCOUNTER — Ambulatory Visit (INDEPENDENT_AMBULATORY_CARE_PROVIDER_SITE_OTHER): Payer: 59 | Admitting: Podiatry

## 2016-09-01 VITALS — BP 131/72 | HR 60 | Ht 66.75 in | Wt 236.0 lb

## 2016-09-01 DIAGNOSIS — M2042 Other hammer toe(s) (acquired), left foot: Secondary | ICD-10-CM | POA: Diagnosis not present

## 2016-09-01 DIAGNOSIS — M79672 Pain in left foot: Secondary | ICD-10-CM | POA: Diagnosis not present

## 2016-09-01 DIAGNOSIS — M79671 Pain in right foot: Secondary | ICD-10-CM | POA: Diagnosis not present

## 2016-09-01 DIAGNOSIS — M2041 Other hammer toe(s) (acquired), right foot: Secondary | ICD-10-CM

## 2016-09-01 NOTE — Progress Notes (Signed)
SUBJECTIVE: 56 y.o. year old female presents complaining of painful hammer toe 2nd on both feet for a few years.   Left thumb basal joint surgery 07/21/16. Will be returning to work after another 6 more weeks. Gastric slip surgery 2 years ago, left heel spur surgery 2008.  REVIEW OF SYSTEMS: Pertinent items noted in HPI and remainder of comprehensive ROS otherwise negative.  OBJECTIVE: DERMATOLOGIC EXAMINATION: Normal findings.  VASCULAR EXAMINATION OF LOWER LIMBS: All pedal pulses are palpable with normal pulsation.  Capillary Filling times within 3 seconds in all digits.  No edema or erythema noted. Temperature gradient from tibial crest to dorsum of foot is within normal bilateral.  NEUROLOGIC EXAMINATION OF THE LOWER LIMBS: Achilles DTR is present and within normal. Monofilament (Semmes-Weinstein 10-gm) sensory testing positive 6 out of 6, bilateral. Vibratory sensations(128Hz  turning fork) intact at medial and lateral forefoot bilateral.  Sharp and Dull discriminatory sensations at the plantar ball of hallux is intact bilateral.   MUSCULOSKELETAL EXAMINATION: Positive for multiple contracted lesser digits bilateral. Symptomatic hammer toe on 2nd bilateral.  RADIOGRAPHIC FINDINGS: AP View:  Rectus foot with multiple contracted lesser digits bilateral. Lateral view:  Contracted lesser digits bilateral.  ASSESSMENT: Symptomatic hammer toe 2nd bilateral.  PLAN: Reviewed findings and available treatment options. As per request surgery consent form reviewed for Hammer toe repair 2nd bilateral.

## 2016-09-01 NOTE — Patient Instructions (Addendum)
Seen for painful toe 2nd bilateral. Surgery consent form reviewed for Hammer toe repair 2nd bilateral.

## 2016-09-09 ENCOUNTER — Ambulatory Visit (INDEPENDENT_AMBULATORY_CARE_PROVIDER_SITE_OTHER): Payer: 59 | Admitting: Neurology

## 2016-09-09 ENCOUNTER — Encounter: Payer: Self-pay | Admitting: Neurology

## 2016-09-09 VITALS — BP 122/64 | HR 62 | Ht 66.75 in | Wt 231.4 lb

## 2016-09-09 DIAGNOSIS — G250 Essential tremor: Secondary | ICD-10-CM | POA: Diagnosis not present

## 2016-09-09 DIAGNOSIS — R251 Tremor, unspecified: Secondary | ICD-10-CM | POA: Diagnosis not present

## 2016-09-09 MED ORDER — PRIMIDONE 50 MG PO TABS: 50.0000 mg | ORAL_TABLET | Freq: Every day | ORAL | 6 refills | Status: DC

## 2016-09-09 NOTE — Progress Notes (Signed)
[facility]    Provider:  Dr Lucia Gaskins Referring Provider: Marguerita Merles Primary Care Physician:  Terressa Koyanagi  CC: Essential Tremor  Interval history 09/09/2016: Here for follow up of essential tremor. The tremor is about the same. She has nonrestorative sleep, dry mouth in the morning, wakes up with headaches.   Interval history 07/15/2015: The essential tremor has progressed into the other hand. The medication is still helping, doesn't want to increase. No progression into the mouth or voice. Not affecting life significantly, doing well. Discussed essential tremor, is considered benign because it does not progress to any serious disease but it can be disturbing and can affect activities of daily living. 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 She follows with a hand surgeon at [facility] and just had injections in the right hand. She is worried about parkinson's disease, reassured her that this is not PD and will not progress to PD. Discussed association with family history of which she has no family history. No side effects from the propranolol. She was also started on labetalol for blood pressure, by a kidney doctor and he is not aware she is also taking propranolol. Her blood pressure and pulse is good today, advised her to ensure he knows she is on both. Mcneil Sober, MD.  What I would prefer is that this physician stop the labetalol and increase the propranol instead of having her on 2 different beta-blockers. When she stopped the propranolol the tremors worsened even while on the labetalol. [phone] will call patient after speaking with Dr. I, left my cell phone and name with his office. She had a gastric sleeze. She has lost 42 pounds. Tremor worse with stress. Numbness and tingling in the feet resolved.   HPI: Ashley [name] is a 56 y.o. female here as a follow up for essential tremor. She is feeling better. Tremor improved on the inderal . Not  bothering her, not interfering with any daily functions. She says her feet have been numb on occassion. She was sitting eating lunch and her right foot got numb to the ankles. Got better with stretching. Has happened 3-4x in the right leg and at one point was persistent for several months. Not worse at night. No low back pain. Some foot cramps at night. She has pain in digit 2 in the hands. Previous CTS.  Visit with Dr. Hosie Poisson 03/23/2013: Tremor started years ago, saw neurologist in [facility] and then moved to this area. Was told it was essential tremor at that time, suggested she try a medication which patient reports with a seizure medication, patient refused at that time. Started in left hand, now in both but worse in left. Notes it the most when holding things, more of a postural/action tremor. Worse with stress. Has minimal tremor at rest. More of a nuisance. Not affecting daily life the No bradykinesias, no gait instability, no muscle cramping. No difficulty sleeping, some talking in her sleep. Has limited sense of smell. No change in handwriting. No known thyroid abnormalities, has been checked at PCP. Started Inderal  ER, this week. Tolerating well, has not noted any benefit. No noted effect with EtOH but drinks infrequently.   No family history of tremor a neurodegenerative process Review of Systems: Patient complains of symptoms per HPI as well as the following symptoms: No CP, no SOB. Pertinent negatives per HPI. All others negative.    Social History   Social History  . Marital status: Married  Spouse name: Ashley [name]   . Number of children: 3  . Years of education: 12+   Occupational History  . Not currently working post-op [employer]   Social History Main Topics  . Smoking status: Never Smoker  . Smokeless tobacco: Never Used  . Alcohol use No  . Drug use: No  . Sexual activity: Not on file   Other Topics Concern  . Not on file   Social History Narrative    Patient is Married to Ashley [name].    Patient works full-time.   Patient has 3 children.    Patient has some college.    Right-handed          Family History  Problem Relation Age of Onset  . Ashley cancer Neg Hx   . Esophageal cancer Neg Hx   . Rectal cancer Neg Hx   . Stomach cancer Neg Hx     Past Medical History:  Diagnosis Date  . Essential tremor   . GERD (gastroesophageal reflux disease)   . History of cholecystectomy   . Hypertension   . Neuropathy   . Palpitations   . Seasonal allergies   . Sinus infection     Past Surgical History:  Procedure Laterality Date  . CARPAL TUNNEL RELEASE Right 2005   left 2011  . CESAREAN SECTION  1989  . CHOLECYSTECTOMY  2008  . HEEL SPUR SURGERY Left   . history of neuroplasty decompression median nerve at carpal tunnel    . LAPAROSCOPIC GASTRIC SLEEVE RESECTION WITH HIATAL HERNIA REPAIR  04/09/2015   Procedure: LAPAROSCOPIC GASTRIC SLEEVE RESECTION WITH HIATAL HERNIA REPAIR;  Surgeon: Glenna Fellows, MD;  Location: [facility];  Service: General;;  . UPPER GI ENDOSCOPY  04/09/2015   Procedure: UPPER GI ENDOSCOPY;  Surgeon: Glenna Fellows, MD;  Location: [facility];  Service: General;;    Current Outpatient Prescriptions  Medication Sig Dispense Refill  . CALCIUM CITRATE PO Take 1 tablet by mouth 2 (two) times daily.    . Cyanocobalamin 500 MCG SUBL Place 500 mcg under the tongue daily.    . cyclobenzaprine (FLEXERIL) 10 MG tablet Take 10 mg by mouth 3 (three) times daily as needed for muscle spasms.    . flintstones complete (FLINTSTONES) 60 MG chewable tablet Chew 1 tablet by mouth daily.    Marland Kitchen gabapentin (NEURONTIN) 300 MG capsule TAKE 1 CAPSULE (300 MG TOTAL) BY MOUTH AT BEDTIME AS NEEDED. 90 capsule 0  . hydrOXYzine (ATARAX/VISTARIL) 50 MG tablet Take 50 mg by mouth every 8 (eight) hours as needed for itching.     . montelukast (SINGULAIR) 10 MG tablet Take 10 mg by mouth at bedtime.    . propranolol ER (INDERAL LA) 80 MG 24 hr  capsule TAKE 1 CAPSULE (80 MG TOTAL) BY MOUTH DAILY. 90 capsule 3  . ranitidine (ZANTAC) 150 MG tablet Take 150 mg by mouth 2 (two) times daily as needed for heartburn.     . fluticasone (FLONASE) 50 MCG/ACT nasal spray Place 1 spray into the nose daily.      No current facility-administered medications for this visit.     Allergies as of 09/09/2016 - Review Complete 09/09/2016  Allergen Reaction Noted  . Azithromycin Itching 03/29/2015  . Chocolate Hives 03/22/2013  . Mobic [meloxicam]  12/28/2012  . Neomycin Hives 03/22/2013  . Neosporin [neomycin-bacitracin zn-polymyx] Hives 03/22/2013  . Niacin and related  12/28/2012  . Strawberry extract Hives 03/22/2013  . Erythromycin Hives 01/27/2013    Vitals: BP  122/64   Pulse 62   Ht 5' 6.75" (1.695 m)   Wt 231 lb 6.4 oz (105 kg)   BMI 36.51 kg/m  Last Weight:  Wt Readings from Last 1 Encounters:  09/09/16 231 lb 6.4 oz (105 kg)   Last Height:   Ht Readings from Last 1 Encounters:  09/09/16 5' 6.75" (1.695 m)    Speech:  Speech is normal; fluent and spontaneous with normal comprehension.  Cognition:  The patient is oriented to person, place, and time;   recent and remote memory intact;   language fluent;   normal attention, concentration,   fund of knowledge Cranial Nerves:  The pupils are equal, round, and reactive to light. Visual fields are full to finger confrontation. Extraocular movements are intact. Trigeminal sensation is intact and the muscles of mastication are normal. The face is symmetric. The palate elevates in the midline. Hearing intact. Voice is normal. Shoulder shrug is normal. The tongue has normal motion without fasciculations.   Coordination:  No dysmetria   Motor Observation:  Postural action and mild intention tremor right > left  Tone:  Normal muscle tone.   Posture:  Posture is normal.   Strength:  Strength is V/V in the upper and lower limbs.     Sensation: intact to pin prick, vibration and proprioception distally in the feet.   Reflex Exam:  DTR's:  Deep tendon reflexes achilles 2+ bilat  Toes:  The toes are downgoing bilaterally.  Clonus:  Clonus is absent.   Assessment/Plan: 56 year old woman presenting for follow up evaluation of tremor, characterized by bilateral hand postural and action tremor. Physical exam otherwise unremarkable. Based on history and clinical symptoms is most consistent with a diagnosis of essential tremor. Patient currently on Inderal extended release 80 mg daily, will continue.   - Continue Inderal, pulse is low cannot increase - Start Primidone in the evening - Numbness and tingling in the feet resolved. Neuropathy labs unremarkable.  - Snoring but ESS 6, will clinically monitor and discussed OSA with patient  Orders Placed This Encounter  Procedures  . Comprehensive metabolic panel  . CBC  . TSH    Naomie Dean, MD  [facility] [address] [place], [place] [address]  Phone [phone] Fax [phone]  A total of 25 minutes was spent face-to-face with this patient. Over half this time was spent on counseling patient on the essential tremor diagnosis and different diagnostic and therapeutic options available.

## 2016-09-09 NOTE — Patient Instructions (Addendum)
Remember to drink plenty of fluid, eat healthy meals and do not skip any meals. Try to eat protein with a every meal and eat a healthy snack such as fruit or nuts in between meals. Try to keep a regular sleep-wake schedule and try to exercise daily, particularly in the form of walking, 20-30 minutes a day, if you can.   As far as your medications are concerned, I would like to suggest: Primidone (mysoline) a few hours before bedtime  I would like to see you back in 1 year, sooner if we need to. Please call us with any interim questions, concerns, problems, updates or refill requests.   Our phone number is [phone]. We also have an after hours call service for urgent matters and there is a physician on-call for urgent questions. For any emergencies you know to call 911 or go to the nearest emergency room  Primidone tablets What is this medicine? PRIMIDONE (PRI mi done) is a barbiturate. This medicine is used to control seizures in certain types of epilepsy. It is not for use in absence (petit mal) seizures. This medicine may be used for other purposes; ask your health care provider or pharmacist if you have questions. COMMON BRAND NAME(S): Mysoline What should I tell my health care provider before I take this medicine? They need to know if you have any of these conditions: -kidney disease -liver disease -porphyria -suicidal thoughts, plans, or attempt; a previous suicide attempt by you or a family member -an unusual or allergic reaction to primidone, phenobarbital, other barbiturates or seizure medications, other medicines, foods, dyes, or preservatives -pregnant or trying to get pregnant -breast-feeding How should I use this medicine? Take this medicine by mouth with a glass of water. Follow the directions on the prescription label. Take your doses at regular intervals. Do not take your medicine more often than directed. Do not stop taking except on the advice of your doctor or health care  professional. A special MedGuide will be given to you by the pharmacist with each prescription and refill. Be sure to read this information carefully each time. Contact your pediatrician or health care professional regarding the use of this medication in children. Special care may be needed. While this drug may be prescribed for children for selected conditions, precautions do apply. Overdosage: If you think you have taken too much of this medicine contact a poison control center or emergency room at once. NOTE: This medicine is only for you. Do not share this medicine with others. What if I miss a dose? If you miss a dose, take it as soon as you can. If it is almost time for your next dose, take only that dose. Do not take double or extra doses. What may interact with this medicine? Do not take this medicine with any of the following medications: -voriconazole This medicine may also interact with the following medications: -cancer-treating medications -cyclosporine -disopyramide -doxycycline -female hormones, including contraceptive or birth control pills -medicines for mental depression, anxiety or other mood problems -medicines for treating HIV infection or AIDS -modafinil -prescription pain medications -quinidine -warfarin This list may not describe all possible interactions. Give your health care provider a list of all the medicines, herbs, non-prescription drugs, or dietary supplements you use. Also tell them if you smoke, drink alcohol, or use illegal drugs. Some items may interact with your medicine. What should I watch for while using this medicine? Visit your doctor or health care professional for regular checks on your progress. It  may be 2 to 3 weeks before you see the full effects of this medicine. Do not suddenly stop taking this medicine, you may increase the risk of seizures. Your doctor or health care professional may want to gradually reduce the dose. Wear a medical  identification bracelet or chain to say you have epilepsy, and carry a card that lists all your medications. You may get drowsy or dizzy. Do not drive, use machinery, or do anything that needs mental alertness until you know how this medicine affects you. Do not stand or sit up quickly, especially if you are an older patient. This reduces the risk of dizzy or fainting spells. Alcohol may interfere with the effect of this medicine. Avoid alcoholic drinks. Birth control pills may not work properly while you are taking this medicine. Talk to your doctor about using an extra method of birth control. The use of this medicine may increase the chance of suicidal thoughts or actions. Pay special attention to how you are responding while on this medicine. Any worsening of mood, or thoughts of suicide or dying should be reported to your health care professional right away. Women who become pregnant while using this medicine may enroll in the [place] [employer] Antiepileptic Drug Pregnancy Registry by calling [phone]. This registry collects information about the safety of antiepileptic drug use during pregnancy. What side effects may I notice from receiving this medicine? Side effects that you should report to your doctor or health care professional as soon as possible: -allergic reactions like skin rash, itching or hives, swelling of the face, lips, or tongue -blurred, double vision, or uncontrollable rolling or movements of the eyes -redness, blistering, peeling or loosening of the skin, including inside the mouth -shortness of breath or difficulty breathing -unusual excitement or restlessness, more likely in children and the elderly -unusually weak or tired -worsening of mood, thoughts or actions of suicide or dying Side effects that usually do not require medical attention (report to your doctor or health care professional if they continue or are bothersome): -clumsiness, unsteadiness, or a hang-over  effect -decreased sexual ability -dizziness, drowsiness -loss of appetite -nausea or vomiting This list may not describe all possible side effects. Call your doctor for medical advice about side effects. You may report side effects to FDA at 1-800-FDA-1088. Where should I keep my medicine? Keep out of the reach of children. This medicine may cause accidental overdose and death if it taken by other adults, children, or pets. Mix any unused medicine with a substance like cat litter or coffee grounds. Then throw the medicine away in a sealed container like a sealed bag or a coffee can with a lid. Do not use the medicine after the expiration date. Store at room temperature between 15 and 30 degrees C (59 and 86 degrees F). NOTE: This sheet is a summary. It may not cover all possible information. If you have questions about this medicine, talk to your doctor, pharmacist, or health care provider.  2018 Elsevier/Gold Standard (2013-07-07 15:40:08)

## 2016-09-10 ENCOUNTER — Other Ambulatory Visit: Payer: Self-pay | Admitting: Podiatry

## 2016-09-10 ENCOUNTER — Telehealth: Payer: Self-pay | Admitting: *Deleted

## 2016-09-10 LAB — COMPREHENSIVE METABOLIC PANEL
A/G RATIO: 2.1 (ref 1.2–2.2)
ALBUMIN: 4.1 g/dL (ref 3.5–5.5)
ALT: 15 IU/L (ref 0–32)
AST: 20 IU/L (ref 0–40)
Alkaline Phosphatase: 87 IU/L (ref 39–117)
BUN/Creatinine Ratio: 13 (ref 9–23)
BUN: 13 mg/dL (ref 6–24)
Bilirubin Total: 0.4 mg/dL (ref 0.0–1.2)
CALCIUM: 9.4 mg/dL (ref 8.7–10.2)
CO2: 29 mmol/L (ref 18–29)
CREATININE: 1.02 mg/dL — AB (ref 0.57–1.00)
Chloride: 102 mmol/L (ref 96–106)
GFR calc Af Amer: 72 mL/min/{1.73_m2} (ref >59)
GFR, EST NON AFRICAN AMERICAN: 63 mL/min/{1.73_m2} (ref >59)
GLOBULIN, TOTAL: 2 g/dL (ref 1.5–4.5)
Glucose: 74 mg/dL (ref 65–99)
POTASSIUM: 4.6 mmol/L (ref 3.5–5.2)
SODIUM: 142 mmol/L (ref 134–144)
Total Protein: 6.1 g/dL (ref 6.0–8.5)

## 2016-09-10 LAB — CBC
Hematocrit: 41.1 % (ref 34.0–46.6)
Hemoglobin: 13.7 g/dL (ref 11.1–15.9)
MCH: 31.7 pg (ref 26.6–33.0)
MCHC: 33.3 g/dL (ref 31.5–35.7)
MCV: 95 fL (ref 79–97)
PLATELETS: 263 10*3/uL (ref 150–379)
RBC: 4.32 x10E6/uL (ref 3.77–5.28)
RDW: 14.1 % (ref 12.3–15.4)
WBC: 6.4 10*3/uL (ref 3.4–10.8)

## 2016-09-10 LAB — TSH: TSH: 2.25 u[IU]/mL (ref 0.450–4.500)

## 2016-09-10 MED ORDER — NABUMETONE 500 MG PO TABS: 500.0000 mg | ORAL_TABLET | Freq: Two times a day (BID) | ORAL | 1 refills | Status: DC

## 2016-09-10 MED ORDER — OXYCODONE-ACETAMINOPHEN 7.5-325 MG PO TABS: 1.0000 | ORAL_TABLET | Freq: Four times a day (QID) | ORAL | 0 refills | Status: DC | PRN

## 2016-09-10 NOTE — Telephone Encounter (Signed)
Per Dr Lucia Gaskins, spoke with patient and informed her that her lab results are unremarkable. She verbalized understanding, appreciation.

## 2016-09-11 DIAGNOSIS — M2041 Other hammer toe(s) (acquired), right foot: Secondary | ICD-10-CM | POA: Diagnosis not present

## 2016-09-11 DIAGNOSIS — M2042 Other hammer toe(s) (acquired), left foot: Secondary | ICD-10-CM | POA: Diagnosis not present

## 2016-09-11 HISTORY — PX: OTHER SURGICAL HISTORY: SHX169

## 2016-09-14 ENCOUNTER — Encounter: Payer: Self-pay | Admitting: *Deleted

## 2016-09-17 ENCOUNTER — Encounter: Payer: Self-pay | Admitting: Podiatry

## 2016-09-17 ENCOUNTER — Ambulatory Visit (INDEPENDENT_AMBULATORY_CARE_PROVIDER_SITE_OTHER): Payer: 59 | Admitting: Podiatry

## 2016-09-17 DIAGNOSIS — Z9889 Other specified postprocedural states: Secondary | ICD-10-CM

## 2016-09-17 MED ORDER — AMOXICILLIN-POT CLAVULANATE 875-125 MG PO TABS: 1.0000 | ORAL_TABLET | Freq: Two times a day (BID) | ORAL | 0 refills | Status: DC

## 2016-09-17 MED ORDER — OXYCODONE-ACETAMINOPHEN 7.5-325 MG PO TABS: 1.0000 | ORAL_TABLET | Freq: Four times a day (QID) | ORAL | 0 refills | Status: DC | PRN

## 2016-09-17 NOTE — Progress Notes (Signed)
6 days post op following Hammer toe repair 2nd bilateral (09/11/16). Stated that she had been having pain on right 2nd toe since the surgery, then the left 2nd toe started to hurt bad last night.  Noted of excess blood on surgical dressing that are firm and hardened on both dressings. Left 2nd digit is purple with erythema that is extending to the 3rd digit base. Incision line is dry and in good approximation.  Right 2nd digit has mild localized erythema without edema. Incision site is clean dry and in good approximation. She is currently on Augmentin 875 mg due to her Sinus infection.  Assessment: Excess post op bleeding through dressing that had been dried and prevented normal blood flow. Possible excess pain from ischemic tissue due to compression from dry blood in dressing.  Plan: Reviewed findings and wound cleansed with H2O2 and Iodine. Loose dressing placed with Amerigel ointment to surgical incision. Advised not to use Ace wrap unless it feels necessary. Upon change of dressing patient noted the left toe pain is down to tolerable level. Augmentin re ordered. Advised to move the toes to promote circulation.  Will monitor closely. Return in 5 days.

## 2016-09-17 NOTE — Patient Instructions (Signed)
6 days post op follow up. Been having pain due to tightness form dry blood. Noted of excess redness and purple colored distal end left 2nd toe possible due to tight dry blood. Will continue with Augmentin. Loose dressing placed with antibiotic ointment. Return next Monday.

## 2016-09-21 ENCOUNTER — Ambulatory Visit (INDEPENDENT_AMBULATORY_CARE_PROVIDER_SITE_OTHER): Payer: 59 | Admitting: Podiatry

## 2016-09-21 DIAGNOSIS — Z9889 Other specified postprocedural states: Secondary | ICD-10-CM

## 2016-09-21 NOTE — Patient Instructions (Signed)
10 days post op wound show improvement since last visit. Dressing change done.  Return this Friday for dressing change.

## 2016-09-22 ENCOUNTER — Encounter: Payer: Self-pay | Admitting: Podiatry

## 2016-09-22 NOTE — Progress Notes (Signed)
10 day post op wound. Erythema subsided and toes are close to normal color. Small skin blister under the sulcus of 2nd toe left drained clear fluid. Still has residual erythema at the base of 3rd left and at the base of 2nd right. Skin edges are clean and dry. Wound cleansed with Iodine and wound redressed. Return this Friday.

## 2016-09-23 ENCOUNTER — Ambulatory Visit: Payer: 59 | Admitting: Podiatry

## 2016-09-24 ENCOUNTER — Ambulatory Visit (INDEPENDENT_AMBULATORY_CARE_PROVIDER_SITE_OTHER): Payer: 59 | Admitting: Podiatry

## 2016-09-24 ENCOUNTER — Encounter: Payer: Self-pay | Admitting: Podiatry

## 2016-09-24 DIAGNOSIS — Z9889 Other specified postprocedural states: Secondary | ICD-10-CM

## 2016-09-24 NOTE — Progress Notes (Signed)
Having pain at surgical site possible due to tight sutures on swollen toes. Surgical wound is clean and dry without any active drainage. Noted of decreasing redness and incision sites are more dry with good coaptation since her last visit, 09/22/16. Tight sutures loosened by clipping a few on each side. Pain was lessened. Wound cleansed with Iodine and Amerigel dressing applied. Continue with antibiotics. Return in one week.

## 2016-09-24 NOTE — Patient Instructions (Signed)
Having pain at surgical site possible due to tight sutures on swollen toes. Surgical wound is clean and dry without any active drainage. Noted of decreasing redness and incision sites are more dry with good coaptation. Tight sutures loosened.  Wound cleansed with Iodine and Amerigel dressing applied. Return in one week.

## 2016-09-25 ENCOUNTER — Encounter: Payer: 59 | Admitting: Podiatry

## 2016-09-28 ENCOUNTER — Telehealth: Payer: Self-pay | Admitting: *Deleted

## 2016-09-28 NOTE — Telephone Encounter (Signed)
09/28/16 Patient called this afternoon. She took her last antibiotic yesterday and ask does she need another refill, she still has some drainage. She is back at work today.

## 2016-09-30 ENCOUNTER — Ambulatory Visit (INDEPENDENT_AMBULATORY_CARE_PROVIDER_SITE_OTHER): Payer: 59 | Admitting: Podiatry

## 2016-09-30 ENCOUNTER — Encounter: Payer: Self-pay | Admitting: Podiatry

## 2016-09-30 DIAGNOSIS — Z9889 Other specified postprocedural states: Secondary | ICD-10-CM

## 2016-09-30 DIAGNOSIS — M2042 Other hammer toe(s) (acquired), left foot: Secondary | ICD-10-CM

## 2016-09-30 DIAGNOSIS — M2041 Other hammer toe(s) (acquired), right foot: Secondary | ICD-10-CM

## 2016-09-30 MED ORDER — AMOXICILLIN-POT CLAVULANATE 875-125 MG PO TABS: 1.0000 | ORAL_TABLET | Freq: Two times a day (BID) | ORAL | 0 refills | Status: DC

## 2016-09-30 MED ORDER — OXYCODONE-ACETAMINOPHEN 7.5-325 MG PO TABS: 1.0000 | ORAL_TABLET | Freq: Four times a day (QID) | ORAL | 0 refills | Status: DC | PRN

## 2016-09-30 NOTE — Progress Notes (Signed)
S/P 3 weeks post op following Hammer toe surgery 09/11/16. Has had swollen and excess redness with excess post op bleeding over the 2nd toe left since the surgery. Patient was able to return to work. Wound cleansed with Iodine and Amerigel dressing applied. Resume antibiotics. Return in one week.

## 2016-09-30 NOTE — Patient Instructions (Signed)
3 weeks post op wound. Noted of new redness at the distal end 2nd toe left. All sutures removed and antibiotic dressing applied. Resume Augmentin antibiotics. Will do dressing change next week.  Call if any abnormal occurrence with the toe or with concern.

## 2016-10-05 ENCOUNTER — Encounter: Payer: 59 | Admitting: Podiatry

## 2016-10-06 ENCOUNTER — Encounter: Payer: 59 | Admitting: Podiatry

## 2016-10-07 ENCOUNTER — Ambulatory Visit (INDEPENDENT_AMBULATORY_CARE_PROVIDER_SITE_OTHER): Payer: 59 | Admitting: Podiatry

## 2016-10-07 ENCOUNTER — Encounter: Payer: Self-pay | Admitting: Podiatry

## 2016-10-07 DIAGNOSIS — M79671 Pain in right foot: Secondary | ICD-10-CM | POA: Diagnosis not present

## 2016-10-07 DIAGNOSIS — M79672 Pain in left foot: Secondary | ICD-10-CM

## 2016-10-07 DIAGNOSIS — M2041 Other hammer toe(s) (acquired), right foot: Secondary | ICD-10-CM

## 2016-10-07 DIAGNOSIS — M2042 Other hammer toe(s) (acquired), left foot: Secondary | ICD-10-CM

## 2016-10-07 NOTE — Patient Instructions (Signed)
Post op wound is healing well now. Redness and swelling has subsided significantly. Incision area has healed and closed off. Noted of tight scar contracture. Will use Coban and Mefix tape to keep the toes straight and prevent further contracture.  Finish antibiotics. Did post op x-ray for the record.  Follow home care instruction and return in one week.

## 2016-10-07 NOTE — Progress Notes (Signed)
S/P 4 weeks post op following Hammer toe surgery 2nd bilateral 09/11/16. Patient was able to return to work and doing fine. Redness and swelling is subsiding. May use moisturizing cream to scarred area and wrap with Coban and Mefix tape. Post op X-ray done. Normal findings with hammer toe repair 2nd bilateral. Finish antibiotics. Return in one week.

## 2016-10-13 ENCOUNTER — Encounter: Payer: 59 | Admitting: Podiatry

## 2016-10-14 ENCOUNTER — Encounter: Payer: Self-pay | Admitting: Neurology

## 2016-10-14 MED ORDER — PRIMIDONE 50 MG PO TABS: 50.0000 mg | ORAL_TABLET | Freq: Every day | ORAL | 3 refills | Status: DC

## 2016-10-15 ENCOUNTER — Encounter: Payer: Self-pay | Admitting: Podiatry

## 2016-10-15 ENCOUNTER — Ambulatory Visit (INDEPENDENT_AMBULATORY_CARE_PROVIDER_SITE_OTHER): Payer: 59 | Admitting: Podiatry

## 2016-10-15 DIAGNOSIS — Z9889 Other specified postprocedural states: Secondary | ICD-10-CM

## 2016-10-15 NOTE — Patient Instructions (Signed)
Doing better post operatively. Able to wrap the toe in place with Coban. Continue wrapping daily. Return in 2 weeks.

## 2016-10-15 NOTE — Progress Notes (Signed)
S/P 5 weeks post op following Hammer toe surgery 2nd bilateral 09/11/16. Patient was able to return to work and doing fine. Redness and swelling is subsiding. Continue with moisturizing cream to scarred area and wrap with Coban and Mefix tape. Reviewed wrapping again and supply dispensed. Return in two weeks.

## 2016-10-28 ENCOUNTER — Encounter: Payer: Self-pay | Admitting: Podiatry

## 2016-10-28 ENCOUNTER — Ambulatory Visit (INDEPENDENT_AMBULATORY_CARE_PROVIDER_SITE_OTHER): Payer: 59 | Admitting: Podiatry

## 2016-10-28 DIAGNOSIS — Z9889 Other specified postprocedural states: Secondary | ICD-10-CM

## 2016-10-28 NOTE — Progress Notes (Signed)
S/P 7weeks post op following Hammer toe surgery 2nd bilateral4/20/18. Patient was able to return to work and doing fine. Residual redness and some swelling still present.  Stiffness at the dorsal MPJ area with dorsal contraction on right 2nd toe. Lateral contracture at IPJ on 2nd toe left. Reviewed benefit of Cortisone injection. Both 2nd toes anesthetized with 2 ml of 2 % Xylocaine. Both 2nd toe contracted are injected with 2 gm Dexamethasone and 4 mg of Triamcinolone. Both 2nd toes massaged and dressing applied. Continue with moisturizing cream to scarred area and wrap with Coban and Mefix tape. Return in 3 weeks. May repeat injection if indicated.

## 2016-10-28 NOTE — Patient Instructions (Signed)
Cortisone injection given to painful contracted scarred area of both 2nd toes post op. Return in 3 weeks.

## 2016-10-29 ENCOUNTER — Encounter: Payer: 59 | Admitting: Podiatry

## 2016-11-02 ENCOUNTER — Other Ambulatory Visit: Payer: Self-pay | Admitting: Neurology

## 2016-11-18 ENCOUNTER — Encounter: Payer: Self-pay | Admitting: Podiatry

## 2016-11-18 ENCOUNTER — Ambulatory Visit (INDEPENDENT_AMBULATORY_CARE_PROVIDER_SITE_OTHER): Payer: 59 | Admitting: Podiatry

## 2016-11-18 DIAGNOSIS — Z9889 Other specified postprocedural states: Secondary | ICD-10-CM

## 2016-11-18 MED ORDER — OXYCODONE-ACETAMINOPHEN 7.5-325 MG PO TABS: 1.0000 | ORAL_TABLET | Freq: Four times a day (QID) | ORAL | 0 refills | Status: DC | PRN

## 2016-11-18 NOTE — Patient Instructions (Signed)
Follow up on hammer toe surgery 2nd bilateral. Still painful.  Discussed possible future options, different approach to correct deviated position. Pain medication re ordered. Coban dispensed. Return as needed.

## 2016-11-19 NOTE — Progress Notes (Signed)
S/P 10weeks post op following Hammer toe surgery 2nd bilateral4/20/18. Patient came in with her husband and her mother in law.  Patient stated that all scabs over surgical sites came off.  Stated that she got sun burn on top of both feet and have some swelling on lateral column of left foot. She is still working but having pain at the end of the day with swollen toes.  She is still having the toes wrapped onto the great toe using Coban to keep the 2nd toes straight. Stated that she has gotten 2nd opinion on her toes. The doctor advised her it may take another surgery using pin or implant.   Objective: S/P Hammer toe surgery with tendon transfer 2nd digit bilateral. Residual redness with tenderness still present on post surgical toes.  Stiffness at the dorsal MPJ area with dorsal contraction on right 2nd toe right. Lateral contracture at IPJ on 2nd toe left.  Plan: Reviewed findings and possible options. Informed the outcome is not as anticipated but will do whatever is available to improve the situation.  Continue withmoisturizing cream to scarred area and wrap with Coban and Mefix tape. Patient understands necessary buddy wrapping to keep the toes straighter, time may further improve her experience with pain and swelling on the toes, also have possible future surgical options if conditions are met.   Pain medication prescribed. Instructed to come or call as needed.

## 2016-12-25 DIAGNOSIS — K582 Mixed irritable bowel syndrome: Secondary | ICD-10-CM | POA: Insufficient documentation

## 2016-12-25 DIAGNOSIS — E559 Vitamin D deficiency, unspecified: Secondary | ICD-10-CM | POA: Insufficient documentation

## 2017-01-28 ENCOUNTER — Other Ambulatory Visit: Payer: Self-pay | Admitting: Neurology

## 2017-03-01 DIAGNOSIS — B379 Candidiasis, unspecified: Secondary | ICD-10-CM | POA: Insufficient documentation

## 2017-03-01 DIAGNOSIS — T3695XA Adverse effect of unspecified systemic antibiotic, initial encounter: Secondary | ICD-10-CM | POA: Insufficient documentation

## 2017-04-01 IMAGING — DX DG ANKLE COMPLETE 3+V*L*
3 series · 3 of 3 positions shown · non-contrast
Comparison: None.

CLINICAL DATA: Twisting ankle injury after fall at home.
Complaining of lateral ankle pain and painful range of motion.

EXAM:
LEFT ANKLE COMPLETE - 3+ VIEW

[ankle ap]
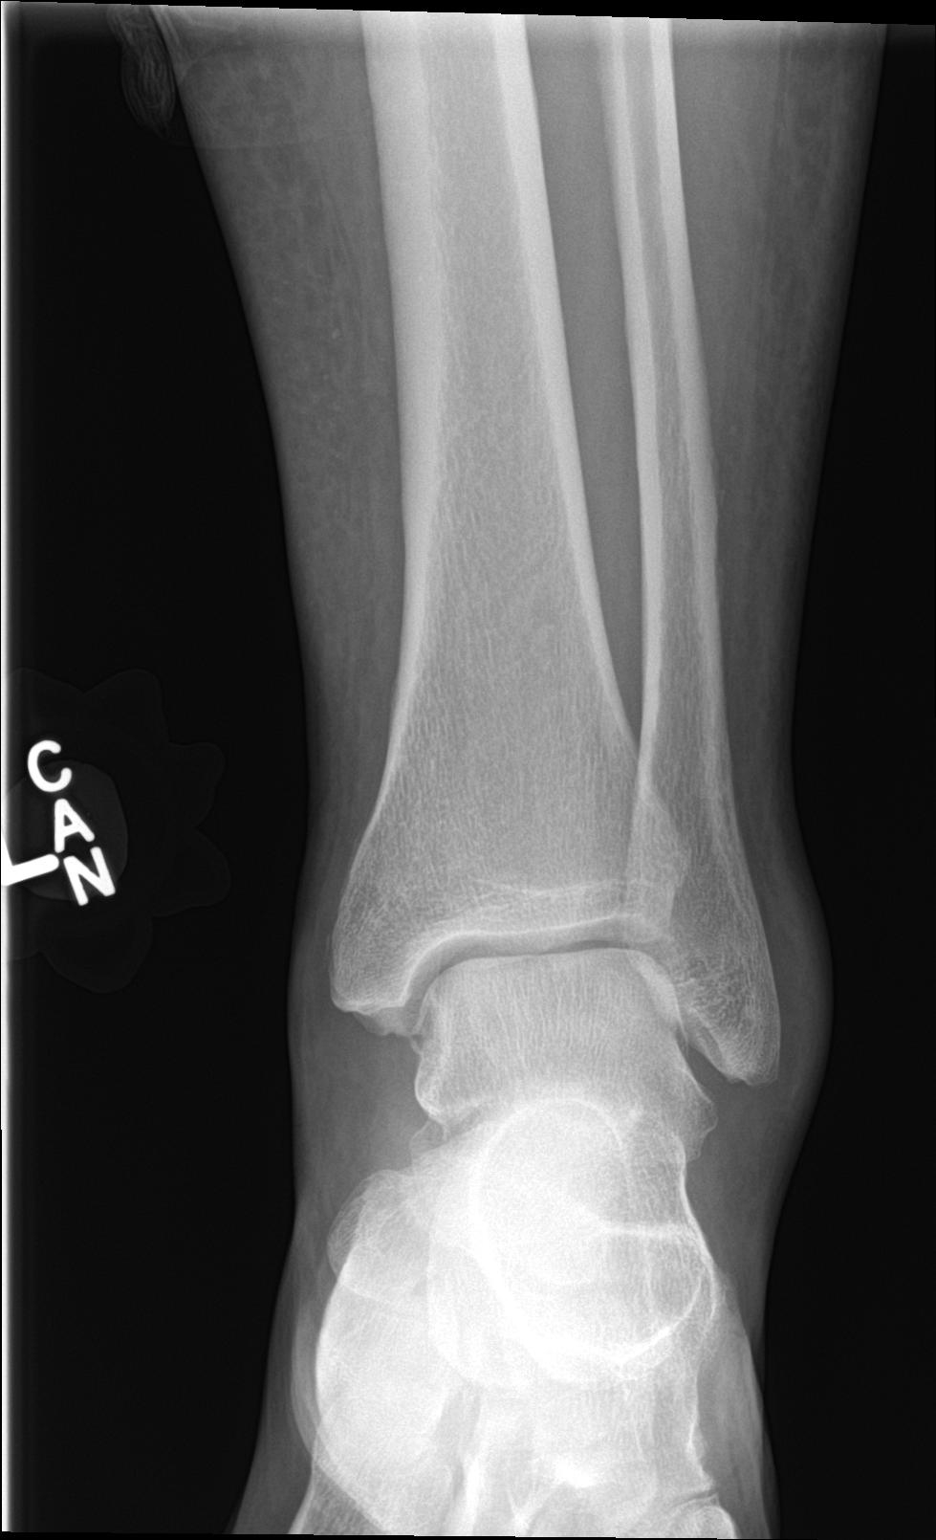

[ankle obl]
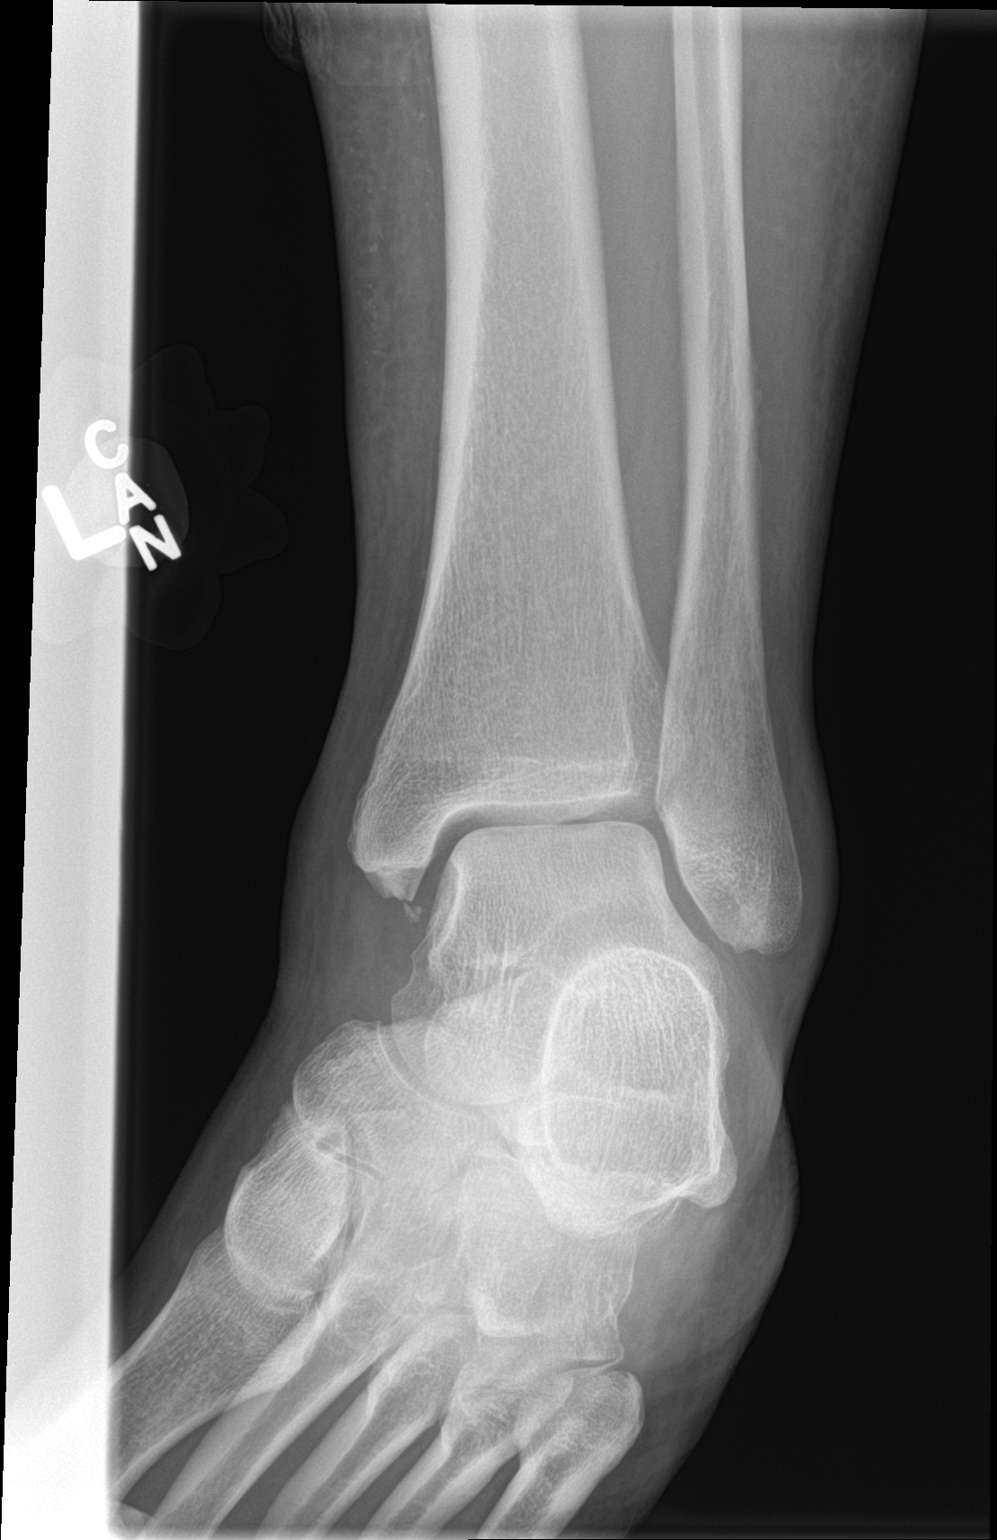

[ankle lat]
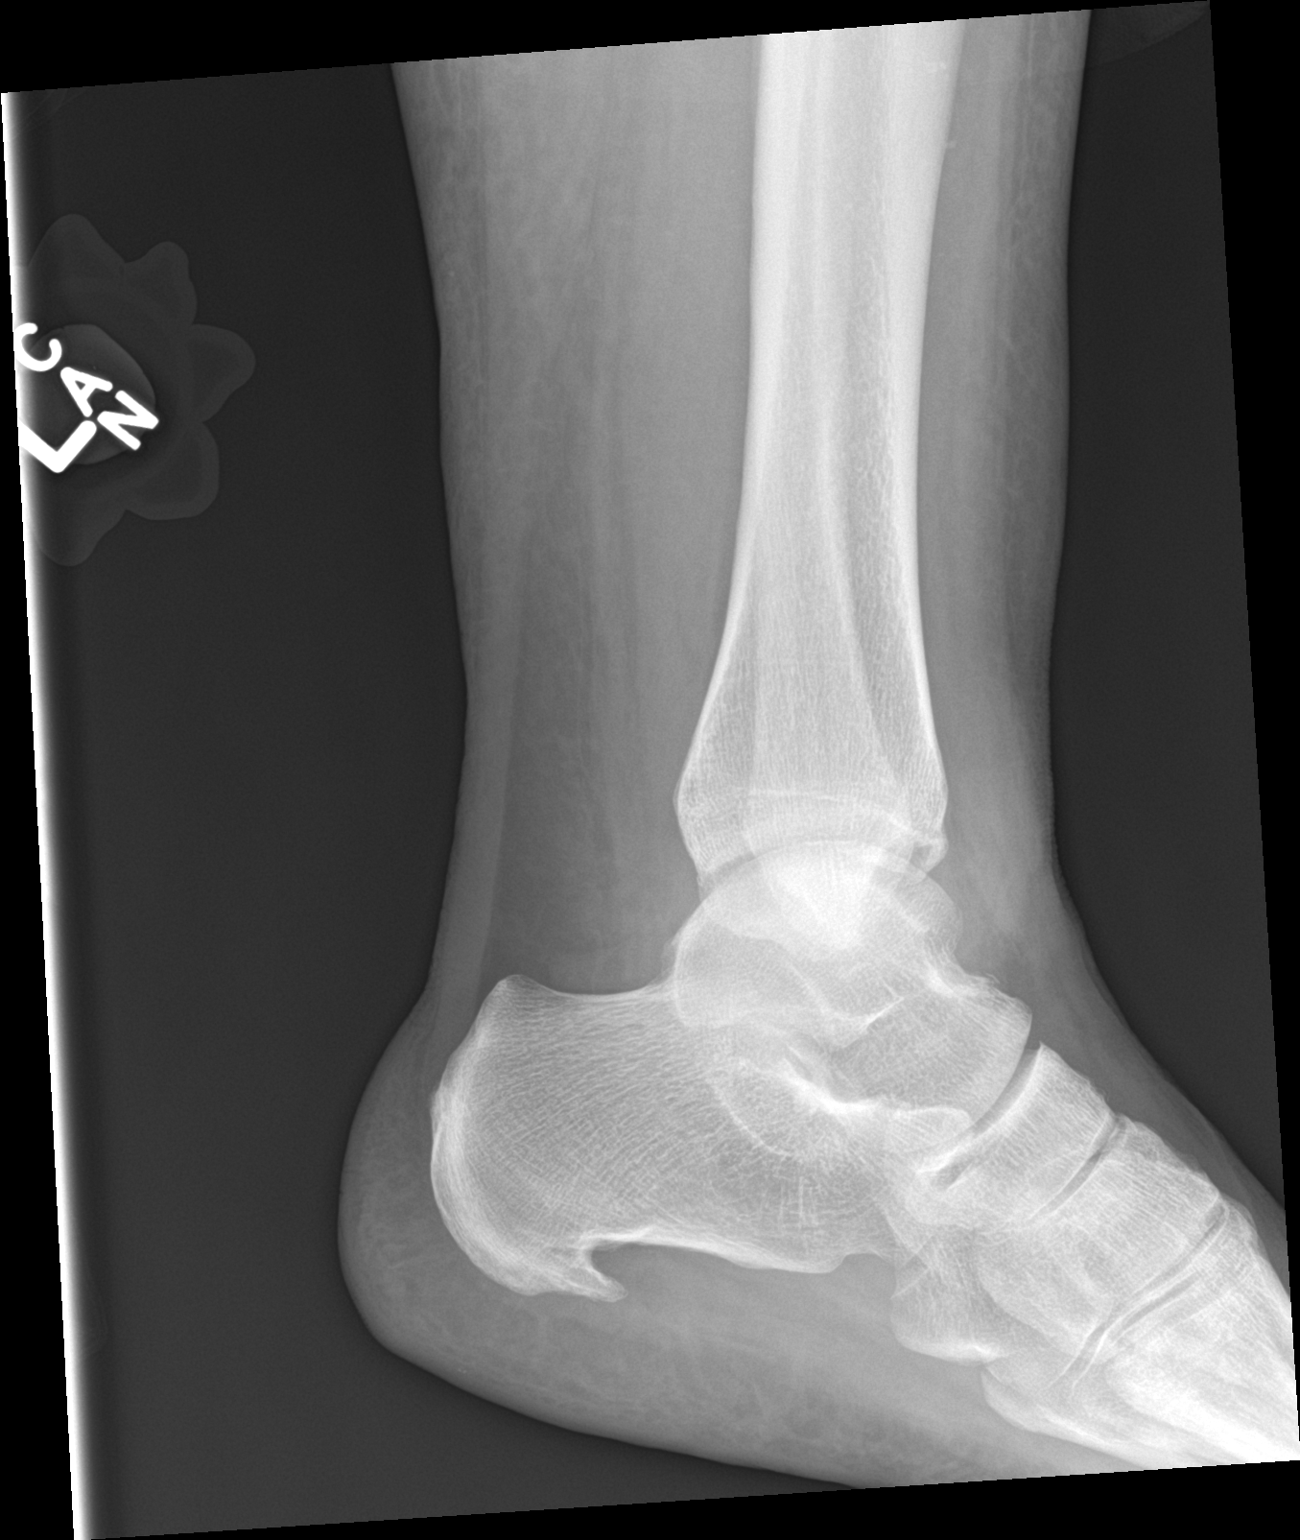

[3 of 3 positions shown; findings below may reference images not displayed]

FINDINGS: Small osseous density just distal to the medial malleolus. This may
reflect a small avulsion fracture, age indeterminate. Suspect tiny
avulsion from the dorsal talus. The ankle mortise is preserved.
There is a plantar calcaneal spur. There is soft tissue edema about
the ankle. Suspect small tibiotalar joint effusion.
IMPRESSION: 1. Suspect minimal avulsion from the dorsal talus, this may be
acute. Tiny osseous density just distal to the medial malleolus, age
indeterminate avulsion injury.
2. Soft tissue edema and suspected small tibial talar joint
effusion.

## 2017-04-21 DIAGNOSIS — M79675 Pain in left toe(s): Secondary | ICD-10-CM | POA: Insufficient documentation

## 2017-04-21 DIAGNOSIS — M2010 Hallux valgus (acquired), unspecified foot: Secondary | ICD-10-CM | POA: Insufficient documentation

## 2017-04-28 ENCOUNTER — Other Ambulatory Visit: Payer: Self-pay | Admitting: Neurology

## 2017-09-13 ENCOUNTER — Encounter: Payer: Self-pay | Admitting: Neurology

## 2017-09-13 ENCOUNTER — Ambulatory Visit: Payer: 59 | Admitting: Neurology

## 2017-09-13 VITALS — BP 113/65 | HR 47 | Ht 66.75 in | Wt 217.0 lb

## 2017-09-13 DIAGNOSIS — G250 Essential tremor: Secondary | ICD-10-CM | POA: Diagnosis not present

## 2017-09-13 MED ORDER — PROPRANOLOL HCL ER 80 MG PO CP24: ORAL_CAPSULE | ORAL | 4 refills | Status: AC

## 2017-09-13 MED ORDER — GABAPENTIN 300 MG PO CAPS: 300.0000 mg | ORAL_CAPSULE | Freq: Every evening | ORAL | 4 refills | Status: DC | PRN

## 2017-09-13 MED ORDER — PRIMIDONE 50 MG PO TABS: 50.0000 mg | ORAL_TABLET | Freq: Every day | ORAL | 4 refills | Status: DC

## 2017-09-13 NOTE — Patient Instructions (Signed)
Essential Tremor A tremor is trembling or shaking that you cannot control. Most tremors affect the hands or arms. Tremors can also affect the head, vocal cords, face, and other parts of the body. Essential tremor is a tremor without a known cause. What are the causes? Essential tremor has no known cause. What increases the risk? You may be at greater risk of essential tremor if:  You have a family member with essential tremor.  You are age 55 or older.  You take certain medicines.  What are the signs or symptoms? The main sign of a tremor is uncontrolled and unintentional rhythmic shaking of a body part.  You may have difficulty eating with a spoon or fork.  You may have difficulty writing.  You may nod your head up and down or side to side.  You may have a quivering voice.  Your tremors:  May get worse over time.  May come and go.  May be more noticeable on one side of your body.  May get worse due to stress, fatigue, caffeine, and extreme heat or cold.  How is this diagnosed? Your health care provider can diagnose essential tremor based on your symptoms, medical history, and a physical examination. There is no single test to diagnose an essential tremor. However, your health care provider may perform a variety of tests to rule out other conditions. Tests may include:  Blood and urine tests.  Imaging studies of your brain, such as: ? CT scan. ? MRI.  A test that measures involuntary muscle movement (electromyogram).  How is this treated? Your tremors may go away without treatment. Mild tremors may not need treatment if they do not affect your day-to-day life. Severe tremors may need to be treated using one or a combination of the following options:  Medicines. This may include medicine that is injected.  Lifestyle changes.  Physical therapy.  Follow these instructions at home:  Take medicines only as directed by your health care provider.  Limit alcohol  intake to no more than 1 drink per day for nonpregnant women and 2 drinks per day for men. One drink equals 12 oz of beer, 5 oz of wine, or 1 oz of hard liquor.  Do not use any tobacco products, including cigarettes, chewing tobacco, or electronic cigarettes. If you need help quitting, ask your health care provider.  Take medicines only as directed by your health care provider.  Avoid extreme heat or cold.  Limit the amount of caffeine you consumeas directed by your health care provider.  Try to get eight hours of sleep each night.  Find ways to manage your stress, such as meditation or yoga.  Keep all follow-up visits as directed by your health care provider. This is important. This includes any physical therapy visits. Contact a health care provider if:  You experience any changes in the location or intensity of your tremors.  You start having a tremor after starting a new medicine.  You have tremor with other symptoms such as: ? Numbness. ? Tingling. ? Pain. ? Weakness.  Your tremor gets worse.  Your tremor interferes with your daily life. This information is not intended to replace advice given to you by your health care provider. Make sure you discuss any questions you have with your health care provider. Document Released: 06/01/2014 Document Revised: 10/17/2015 Document Reviewed: 11/06/2013 Elsevier Interactive Patient Education  2018 [employer]  

## 2017-09-13 NOTE — Progress Notes (Signed)
[facility]    Provider:  Dr Lucia Gaskins Referring Provider: Marguerita Merles Primary Care Physician:  Lindley Magnus, PA-C  CC: Essential Tremor  Interval history 09/09/2016: Here for follow up of essential tremor. The tremor is about the same. She has nonrestorative sleep, dry mouth in the morning, wakes up with headaches. No side effects to the propranolol or the primidone. The tremor is manageable. Neuropathy is fine, she is not needing the gabapentin.   Interval history 07/15/2015: The essential tremor has progressed into the other hand. The medication is still helping, doesn't want to increase. No progression into the mouth or voice. Not affecting life significantly, doing well. Discussed essential tremor, is considered benign because it does not progress to any serious disease but it can be disturbing and can affect activities of daily living. 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 She follows with a hand surgeon at [facility] and just had injections in the right hand. She is worried about parkinson's disease, reassured her that this is not PD and will not progress to PD. Discussed association with family history of which she has no family history. No side effects from the propranolol. She was also started on labetalol for blood pressure, by a kidney doctor and he is not aware she is also taking propranolol. Her blood pressure and pulse is good today, advised her to ensure he knows she is on both. Mcneil Sober, MD.  What I would prefer is that this physician stop the labetalol and increase the propranol instead of having her on 2 different beta-blockers. When she stopped the propranolol the tremors worsened even while on the labetalol. [phone] will call patient after speaking with Dr. I, left my cell phone and name with his office. She had a gastric sleeze. She has lost 42 pounds. Tremor worse with stress. Numbness and tingling in the feet resolved.   HPI: Ashley [name] is a 57 y.o. female here as a follow up for essential tremor. She is feeling better. Tremor improved on the inderal 80mg . Not bothering her, not interfering with any daily functions. She says her feet have been numb on occassion. She was sitting eating lunch and her right foot got numb to the ankles. Got better with stretching. Has happened 3-4x in the right leg and at one point was persistent for several months. Not worse at night. No low back pain. Some foot cramps at night. She has pain in digit 2 in the hands. Previous CTS.  Visit with Dr. Hosie Poisson 03/23/2013: Tremor started years ago, saw neurologist in [facility] and then moved to this area. Was told it was essential tremor at that time, suggested she try a medication which patient reports with a seizure medication, patient refused at that time. Started in left hand, now in both but worse in left. Notes it the most when holding things, more of a postural/action tremor. Worse with stress. Has minimal tremor at rest. More of a nuisance. Not affecting daily life the No bradykinesias, no gait instability, no muscle cramping. No difficulty sleeping, some talking in her sleep. Has limited sense of smell. No change in handwriting. No known thyroid abnormalities, has been checked at PCP. Started Inderal 60mg  ER, this week. Tolerating well, has not noted any benefit. No noted effect with EtOH but drinks infrequently.   No family history of tremor a neurodegenerative process Review of Systems: Patient complains of symptoms per HPI as well as the following symptoms: No CP, no SOB. Pertinent  negatives per HPI. All others negative.    Social History   Socioeconomic History  . Marital status: Legally Separated    Spouse name: Colon Branch   . Number of children: 3  . Years of education: 12+  . Highest education level: Not on file  Occupational History  . Occupation: Not currently working post-op    Employer: [employer]  Social Needs  .  Financial resource strain: Not on file  . Food insecurity:    Worry: Not on file    Inability: Not on file  . Transportation needs:    Medical: Not on file    Non-medical: Not on file  Tobacco Use  . Smoking status: Never Smoker  . Smokeless tobacco: Never Used  Substance and Sexual Activity  . Alcohol use: No  . Drug use: No  . Sexual activity: Not on file  Lifestyle  . Physical activity:    Days per week: Not on file    Minutes per session: Not on file  . Stress: Not on file  Relationships  . Social connections:    Talks on phone: Not on file    Gets together: Not on file    Attends religious service: Not on file    Active member of club or organization: Not on file    Attends meetings of clubs or organizations: Not on file    Relationship status: Not on file  . Intimate partner violence:    Fear of current or ex partner: Not on file    Emotionally abused: Not on file    Physically abused: Not on file    Forced sexual activity: Not on file  Other Topics Concern  . Not on file  Social History Narrative   Patient is Married to Dexter, currently separated    Patient works full-time.   Patient has 3 children.    Patient has some college.    Right-handed       Family History  Problem Relation Age of Onset  . Colon cancer Neg Hx   . Esophageal cancer Neg Hx   . Rectal cancer Neg Hx   . Stomach cancer Neg Hx     Past Medical History:  Diagnosis Date  . Essential tremor   . GERD (gastroesophageal reflux disease)   . History of cholecystectomy   . Hypertension   . Neuropathy   . Palpitations   . Seasonal allergies   . Sinus infection     Past Surgical History:  Procedure Laterality Date  . CARPAL TUNNEL RELEASE Right 2005   left 2011  . CESAREAN SECTION  1989  . CHOLECYSTECTOMY  2008  . CORRECTION OF HAMMER TOE REPAIR AND BUNION CORRECTION  2018   both feet  . Hammer Toe Repair Bilateral 09/11/2016   #2 Toe Bilateral   . HEEL SPUR SURGERY Left   .  history of neuroplasty decompression median nerve at carpal tunnel    . LAPAROSCOPIC GASTRIC SLEEVE RESECTION WITH HIATAL HERNIA REPAIR  04/09/2015   Procedure: LAPAROSCOPIC GASTRIC SLEEVE RESECTION WITH HIATAL HERNIA REPAIR;  Surgeon: Glenna Fellows, MD;  Location: [facility];  Service: General;;  . thumb joint replacement Bilateral    R- 03/02/17 & L- 07/21/16  . UPPER GI ENDOSCOPY  04/09/2015   Procedure: UPPER GI ENDOSCOPY;  Surgeon: Glenna Fellows, MD;  Location: [facility];  Service: General;;    Current Outpatient Medications  Medication Sig Dispense Refill  . CALCIUM CITRATE PO Take 1 tablet by mouth 2 (  two) times daily.    . Cyanocobalamin 500 MCG SUBL Place 500 mcg under the tongue daily.    Marland Kitchen. dicyclomine (BENTYL) 10 MG capsule Take 10 mg by mouth 4 (four) times daily as needed for spasms.    . flintstones complete (FLINTSTONES) 60 MG chewable tablet Chew 1 tablet by mouth daily.    . fluticasone (FLONASE) 50 MCG/ACT nasal spray Place 1 spray into the nose 2 (two) times daily.     . montelukast (SINGULAIR) 10 MG tablet Take 10 mg by mouth at bedtime.    . primidone (MYSOLINE) 50 MG tablet Take 1 tablet (50 mg total) by mouth at bedtime. 90 tablet 3  . propranolol ER (INDERAL LA) 80 MG 24 hr capsule TAKE 1 CAPSULE (80 MG TOTAL) BY MOUTH DAILY. 90 capsule 3  . gabapentin (NEURONTIN) 300 MG capsule TAKE ONE CAPSULE BY MOUTH AT BEDTIME AS NEEDED (Patient not taking: Reported on 09/13/2017) 90 capsule 0  . hydrOXYzine (ATARAX/VISTARIL) 50 MG tablet Take 50 mg by mouth every 8 (eight) hours as needed for itching.     . ranitidine (ZANTAC) 150 MG tablet Take 150 mg by mouth 2 (two) times daily as needed for heartburn.      No current facility-administered medications for this visit.     Allergies as of 09/13/2017 - Review Complete 09/13/2017  Allergen Reaction Noted  . Azithromycin Itching 03/29/2015  . Chocolate Hives 03/22/2013  . Mobic [meloxicam]  12/28/2012  . Neomycin Hives  03/22/2013  . Neosporin [neomycin-bacitracin zn-polymyx] Hives 03/22/2013  . Strawberry extract Hives 03/22/2013  . Erythromycin Hives 01/27/2013    Vitals: BP 113/65 (BP Location: Right Arm, Patient Position: Sitting)   Pulse (!) 47   Ht 5' 6.75" (1.695 m)   Wt 217 lb (98.4 kg)   BMI 34.24 kg/m  Last Weight:  Wt Readings from Last 1 Encounters:  09/13/17 217 lb (98.4 kg)   Last Height:   Ht Readings from Last 1 Encounters:  09/13/17 5' 6.75" (1.695 m)    Speech:  Speech is normal; fluent and spontaneous with normal comprehension.  Cognition:  The patient is oriented to person, place, and time;   recent and remote memory intact;   language fluent;   normal attention, concentration,   fund of knowledge Cranial Nerves:  The pupils are equal, round, and reactive to light. Visual fields are full to finger confrontation. Extraocular movements are intact. Trigeminal sensation is intact and the muscles of mastication are normal. The face is symmetric. The palate elevates in the midline. Hearing intact. Voice is normal. Shoulder shrug is normal. The tongue has normal motion without fasciculations.   Coordination:  No dysmetria   Motor Observation:  Postural action and mild intention tremor right > left  Tone:  Normal muscle tone.   Posture:  Posture is normal.   Strength:  Strength is V/V in the upper and lower limbs.    Sensation: intact to pin prick, vibration and proprioception distally in the feet.   Reflex Exam:  DTR's:  Deep tendon reflexes achilles 2+ bilat  Toes:  The toes are downgoing bilaterally.  Clonus:  Clonus is absent.   Assessment/Plan: 57 year old woman presenting for follow up evaluation of tremor, characterized by bilateral hand postural and action tremor. Physical exam otherwise unremarkable. Based on history and clinical symptoms is most consistent with a diagnosis of essential tremor.  Patient currently on Inderal extended release 80 mg daily, will continue.   - Continue Inderal, pulse is low  cannot increase - Continue Primidone in the evening - Numbness and tingling in the feet resolved. Neuropathy labs unremarkable.  - Snoring but ESS 6, will clinically monitor and discussed OSA with patient  No orders of the defined types were placed in this encounter.   Naomie Dean, MD  [facility] [address] [place], [place] [address]  Phone [phone] Fax [phone]  A total of 15 minutes was spent face-to-face with this patient. Over half this time was spent on counseling patient on the essential tremor diagnosis and different diagnostic and therapeutic options available.

## 2017-09-24 DIAGNOSIS — J3089 Other allergic rhinitis: Secondary | ICD-10-CM | POA: Insufficient documentation

## 2017-09-24 DIAGNOSIS — K219 Gastro-esophageal reflux disease without esophagitis: Secondary | ICD-10-CM | POA: Insufficient documentation

## 2017-10-23 ENCOUNTER — Other Ambulatory Visit: Payer: Self-pay | Admitting: Neurology

## 2017-12-29 DIAGNOSIS — Z9884 Bariatric surgery status: Secondary | ICD-10-CM | POA: Insufficient documentation

## 2017-12-29 DIAGNOSIS — M8949 Other hypertrophic osteoarthropathy, multiple sites: Secondary | ICD-10-CM | POA: Insufficient documentation

## 2017-12-29 DIAGNOSIS — M159 Polyosteoarthritis, unspecified: Secondary | ICD-10-CM | POA: Insufficient documentation

## 2017-12-30 ENCOUNTER — Emergency Department (HOSPITAL_BASED_OUTPATIENT_CLINIC_OR_DEPARTMENT_OTHER): Payer: 59

## 2017-12-30 ENCOUNTER — Emergency Department (HOSPITAL_BASED_OUTPATIENT_CLINIC_OR_DEPARTMENT_OTHER)
Admission: EM | Admit: 2017-12-30 | Discharge: 2017-12-30 | Disposition: A | Discharge status: Home / Self Care | Payer: 59 | Attending: Emergency Medicine | Admitting: Emergency Medicine

## 2017-12-30 ENCOUNTER — Other Ambulatory Visit: Payer: Self-pay

## 2017-12-30 ENCOUNTER — Encounter (HOSPITAL_BASED_OUTPATIENT_CLINIC_OR_DEPARTMENT_OTHER): Payer: Self-pay | Admitting: Emergency Medicine

## 2017-12-30 DIAGNOSIS — M62838 Other muscle spasm: Secondary | ICD-10-CM

## 2017-12-30 DIAGNOSIS — Z79899 Other long term (current) drug therapy: Secondary | ICD-10-CM | POA: Insufficient documentation

## 2017-12-30 DIAGNOSIS — M7918 Myalgia, other site: Secondary | ICD-10-CM

## 2017-12-30 DIAGNOSIS — M791 Myalgia, unspecified site: Secondary | ICD-10-CM | POA: Insufficient documentation

## 2017-12-30 DIAGNOSIS — I10 Essential (primary) hypertension: Secondary | ICD-10-CM | POA: Insufficient documentation

## 2017-12-30 DIAGNOSIS — M549 Dorsalgia, unspecified: Secondary | ICD-10-CM | POA: Diagnosis present

## 2017-12-30 MED ORDER — CYCLOBENZAPRINE HCL 10 MG PO TABS: 10.0000 mg | ORAL_TABLET | Freq: Two times a day (BID) | ORAL | 0 refills | Status: DC | PRN

## 2017-12-30 MED ORDER — ACETAMINOPHEN 325 MG PO TABS
650.0000 mg | ORAL_TABLET | Freq: Once | ORAL | Status: AC
  Administered 2017-12-30: 650 mg via ORAL
  Filled 2017-12-30: qty 2

## 2017-12-30 MED ORDER — CYCLOBENZAPRINE HCL 10 MG PO TABS
10.0000 mg | ORAL_TABLET | Freq: Once | ORAL | Status: AC
  Administered 2017-12-30: 10 mg via ORAL
  Filled 2017-12-30: qty 1

## 2017-12-30 MED FILL — CYCLOBENZAPRINE HCL 10 MG T: 10 | 10 days supply | Qty: 20 | Fill #0
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
## 2017-12-30 NOTE — Discharge Instructions (Signed)
Your x-rays today showed arthritis in your back but otherwise no traumatic injuries.  Your exam was reassuring and we felt muscle spasms and muscle tenderness.  We suspect primarily soft tissue injury.  Please use the muscle relaxant to help with your symptoms and stay hydrated.  Please follow-up with your primary doctor for further management.  If any symptoms change or worsen, please return to the nearest emergency department.
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
## 2018-11-17 DIAGNOSIS — A6000 Herpesviral infection of urogenital system, unspecified: Secondary | ICD-10-CM | POA: Insufficient documentation

## 2018-11-21 DIAGNOSIS — R609 Edema, unspecified: Secondary | ICD-10-CM | POA: Insufficient documentation

## 2018-11-21 DIAGNOSIS — R251 Tremor, unspecified: Secondary | ICD-10-CM | POA: Insufficient documentation

## 2019-04-24 ENCOUNTER — Ambulatory Visit: Payer: 59 | Admitting: Orthopedic Surgery

## 2019-08-03 ENCOUNTER — Telehealth: Payer: Self-pay | Admitting: Orthopedic Surgery

## 2019-08-03 NOTE — Telephone Encounter (Signed)
Patient came to office to sign authorization/release form, requesting that notes from her provider at [facility] be faxed to this office for Dr Romeo Apple to review and advise. Patient would like to transfer care to our clinic if approved. Request faxed to # [phone] (on 08/03/19). Patient aware.

## 2019-08-07 ENCOUNTER — Telehealth: Payer: Self-pay | Admitting: Orthopedic Surgery

## 2019-08-07 NOTE — Telephone Encounter (Signed)
I called patient, discussed.  She will call [facility] office and see if she can get an appointment there.  I suggested Dr Ophelia Charter.  She will have Claris Gower let me know if I need to get these records over there.

## 2019-08-07 NOTE — Telephone Encounter (Signed)
I had contacted patient Friday, 08/04/19, upon receipt of records from [facility] for which patient signed a release. Dr Romeo Apple has reviewed the notes. Per Dr Mort Sawyers response, he is no doing this kind of surgery; therefore, cannot do transfer of care.  I relayed this information to patient; she said she just wants to be seen in [place] - said 'just trying to be a patient'. Please advise.

## 2019-08-17 ENCOUNTER — Ambulatory Visit: Payer: 59 | Admitting: Orthopaedic Surgery

## 2019-08-31 ENCOUNTER — Encounter: Payer: Self-pay | Admitting: Orthopaedic Surgery

## 2019-08-31 ENCOUNTER — Ambulatory Visit (INDEPENDENT_AMBULATORY_CARE_PROVIDER_SITE_OTHER): Payer: 59 | Admitting: Orthopaedic Surgery

## 2019-08-31 ENCOUNTER — Other Ambulatory Visit: Payer: Self-pay

## 2019-08-31 VITALS — BP 100/63 | HR 59 | Ht 66.0 in | Wt 226.0 lb

## 2019-08-31 DIAGNOSIS — M67442 Ganglion, left hand: Secondary | ICD-10-CM

## 2019-08-31 MED ORDER — LIDOCAINE HCL 1 % IJ SOLN
0.3000 mL | INTRAMUSCULAR | Status: AC | PRN
  Administered 2019-08-31: 16:00:00 .3 mL

## 2019-08-31 NOTE — Progress Notes (Signed)
Office Visit Note   Patient: Ashley [name]           Date of Birth: Sep 15, 1960           MRN: [id number] Visit Date: 08/31/2019              Requested by: Lindley Magnus, PA-C [address] [place],  [place] [address] PCP: Lindley Magnus, PA-C   Assessment & Plan: Visit Diagnoses:  1. Ganglion cyst of finger of left hand     Plan: After Xylocaine with insulin needle in the skin make small wheal passes were made from distal to proximal to the cyst with no fluid obtained.  Is unable to squeeze any fluid out.  We discussed she can have this removed as an outpatient.  IV sedation with local could be utilized.  Procedure discussed she has a phone number and she can call about scheduling.  Follow-Up Instructions: patient to call about scheduling removal of mass  Orders:  Orders Placed This Encounter  Procedures  . Small Joint Inj: L index PIP   No orders of the defined types were placed in this encounter.     Procedures: Small Joint Inj: L index PIP on 08/31/2019 4:11 PM Details: dorsal approach Medications: 0.3 mL lidocaine 1 % Aspirate: 0 mL      Clinical Data: No additional findings.   Subjective: Chief Complaint  Patient presents with  . Left Index Finger - Pain    HPI 59 year old female here with a nondominant left hand index finger PIP cyst has been present since the beginning of the year.  She states of the last month it is turned dark and now is blue where previously was not that color.  She thinks is gradually enlarged.  She has had previous carpal tunnel syndrome treatment and also [facility]MC arthroplasty in the past.  Patient works at the [employer] and uses a [employer].  She states currently she is working from home.  Review of Systems positive for essential tremor carpal tunnel syndrome morbid obesity previously and now BMI she is reduced to 36.   Objective: Vital Signs: BP 100/63   Pulse (!) 59   Ht 5\' 6"  (1.676 m)   Wt 226 lb (102.5 kg)   BMI 36.48  kg/m   Physical Exam Constitutional:      Appearance: She is well-developed.  HENT:     Head: Normocephalic.     Right Ear: External ear normal.     Left Ear: External ear normal.  Eyes:     Pupils: Pupils are equal, round, and reactive to light.  Neck:     Thyroid: No thyromegaly.     Trachea: No tracheal deviation.  Cardiovascular:     Rate and Rhythm: Normal rate.  Pulmonary:     Effort: Pulmonary effort is normal.  Abdominal:     Palpations: Abdomen is soft.  Skin:    General: Skin is warm and dry.  Neurological:     Mental Status: She is alert and oriented to person, place, and time.  Psychiatric:        Behavior: Behavior normal.     Ortho Exam patient has cyst at the PIP joint dorsal ulnar aspect adjacent extensor tendon it is mobile soft.  Appears to be either jelly filled or fluid-filled.  Collateral ligaments are stable extensor tendon function is intact central slip is intact.  Specialty Comments:  No specialty comments available.  Imaging: No results found.  PMFS History: Patient Active Problem List   Diagnosis Date Noted  . Morbid obesity with BMI of 45.0-49.9, adult ([place]) 04/09/2015  . CTS (carpal tunnel syndrome) 07/02/2014  . Essential tremor 03/23/2013   Past Medical History:  Diagnosis Date  . Essential tremor   . GERD (gastroesophageal reflux disease)   . History of cholecystectomy   . Hypertension   . Neuropathy   . Palpitations   . Seasonal allergies   . Sinus infection     Family History  Problem Relation Age of Onset  . Colon cancer Neg Hx   . Esophageal cancer Neg Hx   . Rectal cancer Neg Hx   . Stomach cancer Neg Hx     Past Surgical History:  Procedure Laterality Date  . CARPAL TUNNEL RELEASE Right 2005   left 2011  . CESAREAN SECTION  1989  . CHOLECYSTECTOMY  2008  . CORRECTION OF HAMMER TOE REPAIR AND BUNION CORRECTION  2018   both feet  . Hammer Toe Repair Bilateral 09/11/2016   #2 Toe Bilateral   . HEEL SPUR  SURGERY Left   . history of neuroplasty decompression median nerve at carpal tunnel    . LAPAROSCOPIC GASTRIC SLEEVE RESECTION WITH HIATAL HERNIA REPAIR  04/09/2015   Procedure: LAPAROSCOPIC GASTRIC SLEEVE RESECTION WITH HIATAL HERNIA REPAIR;  Surgeon: Excell Seltzer, MD;  Location: [facility];  Service: General;;  . thumb joint replacement Bilateral    R- 03/02/17 & L- 07/21/16  . UPPER GI ENDOSCOPY  04/09/2015   Procedure: UPPER GI ENDOSCOPY;  Surgeon: Excell Seltzer, MD;  Location: [facility];  Service: General;;   Social History   Occupational History  . Occupation: Not currently working post-op    Employer: [employer]  Tobacco Use  . Smoking status: Never Smoker  . Smokeless tobacco: Never Used  Substance and Sexual Activity  . Alcohol use: No  . Drug use: No  . Sexual activity: Not on file

## 2019-09-06 ENCOUNTER — Telehealth: Payer: Self-pay | Admitting: Orthopaedic Surgery

## 2019-09-06 NOTE — Telephone Encounter (Signed)
Please advise 

## 2019-09-06 NOTE — Telephone Encounter (Signed)
Patient is scheduled April 19th at [facility] for EXCISION LEFT INDEX FINGER MASS.  She has physical therapy sessions on Monday, Wednesday, and Fridays (7-8am) for left shoulder bursitis.  She would like to know when she will be able to resume therapy after surgery.  She would like to let the therapist know since she is scheduled there this coming Friday.  Pt's cb [phone]

## 2019-09-06 NOTE — Telephone Encounter (Signed)
Patient called requesting up dates about returning about to physical therapy after surgery next week. Patient phone number is [phone].

## 2019-09-06 NOTE — Telephone Encounter (Signed)
I called patient and advised. 

## 2019-09-06 NOTE — Telephone Encounter (Signed)
She can just skip therapy for a week should be fine thank you

## 2019-09-11 ENCOUNTER — Other Ambulatory Visit: Payer: Self-pay | Admitting: Orthopaedic Surgery

## 2019-09-11 ENCOUNTER — Other Ambulatory Visit: Payer: Self-pay

## 2019-09-11 DIAGNOSIS — R2232 Localized swelling, mass and lump, left upper limb: Secondary | ICD-10-CM

## 2019-09-11 MED ORDER — TRAMADOL HCL 50 MG PO TABS: 50.0000 mg | ORAL_TABLET | Freq: Four times a day (QID) | ORAL | 0 refills | Status: DC | PRN

## 2019-09-11 NOTE — Progress Notes (Unsigned)
ultram

## 2019-09-21 ENCOUNTER — Encounter: Payer: Self-pay | Admitting: Orthopaedic Surgery

## 2019-09-21 ENCOUNTER — Ambulatory Visit (INDEPENDENT_AMBULATORY_CARE_PROVIDER_SITE_OTHER): Payer: 59 | Admitting: Orthopaedic Surgery

## 2019-09-21 ENCOUNTER — Other Ambulatory Visit: Payer: Self-pay

## 2019-09-21 DIAGNOSIS — M67449 Ganglion, unspecified hand: Secondary | ICD-10-CM | POA: Insufficient documentation

## 2019-09-21 NOTE — Progress Notes (Signed)
   Post-Op Visit Note   Patient: Ashley [name]           Date of Birth: 1960/09/30           MRN: [id number] Visit Date: 09/21/2019 PCP: Lindley Magnus, PA-C   Assessment & Plan: Follow-up left index finger PIP ganglion excision.  Path was consistent with ganglion.  Chief Complaint:  Chief Complaint  Patient presents with  . Left Hand - Routine Post Op    09/11/2019 Left Index Finger Ganglion Cyst Excision   Visit Diagnoses:  1. Ganglion cyst of finger     Plan: Return in 1 week for suture removal.  Should keep a Band-Aid over the sutures.  She can remove the Band-Aid for showering or washing her hand.  Follow-Up Instructions: No follow-ups on file.   Orders:  No orders of the defined types were placed in this encounter.  No orders of the defined types were placed in this encounter.   Imaging: No results found.  PMFS History: Patient Active Problem List   Diagnosis Date Noted  . Ganglion cyst of finger 09/21/2019  . Morbid obesity with BMI of 45.0-49.9, adult ([facility]) 04/09/2015  . CTS (carpal tunnel syndrome) 07/02/2014  . Essential tremor 03/23/2013   Past Medical History:  Diagnosis Date  . Essential tremor   . GERD (gastroesophageal reflux disease)   . History of cholecystectomy   . Hypertension   . Neuropathy   . Palpitations   . Seasonal allergies   . Sinus infection     Family History  Problem Relation Age of Onset  . Colon cancer Neg Hx   . Esophageal cancer Neg Hx   . Rectal cancer Neg Hx   . Stomach cancer Neg Hx     Past Surgical History:  Procedure Laterality Date  . CARPAL TUNNEL RELEASE Right 2005   left 2011  . CESAREAN SECTION  1989  . CHOLECYSTECTOMY  2008  . CORRECTION OF HAMMER TOE REPAIR AND BUNION CORRECTION  2018   both feet  . Hammer Toe Repair Bilateral 09/11/2016   #2 Toe Bilateral   . HEEL SPUR SURGERY Left   . history of neuroplasty decompression median nerve at carpal tunnel    . LAPAROSCOPIC GASTRIC SLEEVE RESECTION WITH  HIATAL HERNIA REPAIR  04/09/2015   Procedure: LAPAROSCOPIC GASTRIC SLEEVE RESECTION WITH HIATAL HERNIA REPAIR;  Surgeon: Glenna Fellows, MD;  Location: [facility];  Service: General;;  . thumb joint replacement Bilateral    R- 03/02/17 & L- 07/21/16  . UPPER GI ENDOSCOPY  04/09/2015   Procedure: UPPER GI ENDOSCOPY;  Surgeon: Glenna Fellows, MD;  Location: [facility];  Service: General;;   Social History   Occupational History  . Occupation: Not currently working post-op    Employer: [employer]  Tobacco Use  . Smoking status: Never Smoker  . Smokeless tobacco: Never Used  Substance and Sexual Activity  . Alcohol use: No  . Drug use: No  . Sexual activity: Not on file

## 2019-09-28 ENCOUNTER — Ambulatory Visit (INDEPENDENT_AMBULATORY_CARE_PROVIDER_SITE_OTHER): Payer: 59 | Admitting: Orthopaedic Surgery

## 2019-09-28 ENCOUNTER — Encounter: Payer: Self-pay | Admitting: Orthopaedic Surgery

## 2019-09-28 ENCOUNTER — Other Ambulatory Visit: Payer: Self-pay

## 2019-09-28 VITALS — Ht 66.0 in | Wt 226.0 lb

## 2019-09-28 DIAGNOSIS — M67449 Ganglion, unspecified hand: Secondary | ICD-10-CM

## 2019-09-28 NOTE — Progress Notes (Signed)
   Post-Op Visit Note   Patient: Ashley [name]           Date of Birth: [birth year]           MRN: [id number] Visit Date: 09/28/2019 PCP: Lindley Magnus, PA-C   Assessment & Plan:post op ganglion excision left index finger. Sutures removed. Pathology reviewed , ganglion.   Chief Complaint:  Chief Complaint  Patient presents with  . Left Index Finger - Follow-up    09/11/2019 Left Index Finger Ganglion Cyst Excision     Visit Diagnoses:  1. Ganglion cyst of finger     Plan: return PRN  Follow-Up Instructions: No follow-ups on file.   Orders:  No orders of the defined types were placed in this encounter.  No orders of the defined types were placed in this encounter.   Imaging: No results found.  PMFS History: Patient Active Problem List   Diagnosis Date Noted  . Ganglion cyst of finger 09/21/2019  . Morbid obesity with BMI of 45.0-49.9, adult ([facility]) 04/09/2015  . CTS (carpal tunnel syndrome) 07/02/2014  . Essential tremor 03/23/2013   Past Medical History:  Diagnosis Date  . Essential tremor   . GERD (gastroesophageal reflux disease)   . History of cholecystectomy   . Hypertension   . Neuropathy   . Palpitations   . Seasonal allergies   . Sinus infection     Family History  Problem Relation Age of Onset  . Colon cancer Neg Hx   . Esophageal cancer Neg Hx   . Rectal cancer Neg Hx   . Stomach cancer Neg Hx     Past Surgical History:  Procedure Laterality Date  . CARPAL TUNNEL RELEASE Right 2005   left 2011  . CESAREAN SECTION  1989  . CHOLECYSTECTOMY  2008  . CORRECTION OF HAMMER TOE REPAIR AND BUNION CORRECTION  2018   both feet  . Hammer Toe Repair Bilateral 09/11/2016   #2 Toe Bilateral   . HEEL SPUR SURGERY Left   . history of neuroplasty decompression median nerve at carpal tunnel    . LAPAROSCOPIC GASTRIC SLEEVE RESECTION WITH HIATAL HERNIA REPAIR  04/09/2015   Procedure: LAPAROSCOPIC GASTRIC SLEEVE RESECTION WITH HIATAL HERNIA REPAIR;  Surgeon:  Glenna Fellows, MD;  Location: [facility];  Service: General;;  . thumb joint replacement Bilateral    R- 03/02/17 & L- 07/21/16  . UPPER GI ENDOSCOPY  04/09/2015   Procedure: UPPER GI ENDOSCOPY;  Surgeon: Glenna Fellows, MD;  Location: [facility];  Service: General;;   Social History   Occupational History  . Occupation: Not currently working post-op    Employer: [employer]  Tobacco Use  . Smoking status: Never Smoker  . Smokeless tobacco: Never Used  Substance and Sexual Activity  . Alcohol use: No  . Drug use: No  . Sexual activity: Not on file

## 2020-05-20 ENCOUNTER — Emergency Department (HOSPITAL_COMMUNITY)
Admission: EM | Admit: 2020-05-20 | Discharge: 2020-05-20 | Disposition: A | Discharge status: Home / Self Care | Payer: 59 | Attending: Emergency Medicine | Admitting: Emergency Medicine

## 2020-05-20 ENCOUNTER — Other Ambulatory Visit: Payer: Self-pay

## 2020-05-20 DIAGNOSIS — Z5321 Procedure and treatment not carried out due to patient leaving prior to being seen by health care provider: Secondary | ICD-10-CM | POA: Diagnosis not present

## 2020-05-20 DIAGNOSIS — S61012A Laceration without foreign body of left thumb without damage to nail, initial encounter: Secondary | ICD-10-CM | POA: Diagnosis not present

## 2020-05-20 DIAGNOSIS — X58XXXA Exposure to other specified factors, initial encounter: Secondary | ICD-10-CM | POA: Diagnosis not present

## 2020-05-20 NOTE — ED Triage Notes (Signed)
Pt has a laceration on her Left thumb. Bleeding controlled.

## 2020-05-21 ENCOUNTER — Telehealth: Payer: Self-pay | Admitting: Orthopaedic Surgery

## 2020-05-21 NOTE — Telephone Encounter (Signed)
Called pt left vm for pt to call and set appt to be seen by Dr. Ophelia Charter for right leg/thigh pains and back pains. Will try again later

## 2020-05-22 ENCOUNTER — Encounter: Payer: Self-pay | Admitting: Orthopaedic Surgery

## 2020-05-22 ENCOUNTER — Ambulatory Visit: Payer: 59 | Admitting: Orthopaedic Surgery

## 2020-05-22 ENCOUNTER — Telehealth: Payer: Self-pay | Admitting: Orthopaedic Surgery

## 2020-05-22 NOTE — Telephone Encounter (Signed)
Called 2X time left vm for pt to call and set appt with Dr. Ophelia Charter for right leg/thigh and back pains. Will try again later

## 2020-05-23 ENCOUNTER — Ambulatory Visit: Payer: 59 | Admitting: Surgical

## 2020-05-23 ENCOUNTER — Ambulatory Visit: Payer: Self-pay

## 2020-05-23 DIAGNOSIS — M79604 Pain in right leg: Secondary | ICD-10-CM

## 2020-05-23 DIAGNOSIS — M5410 Radiculopathy, site unspecified: Secondary | ICD-10-CM | POA: Diagnosis not present

## 2020-05-23 MED ORDER — METHOCARBAMOL 500 MG PO TABS: 500.0000 mg | ORAL_TABLET | Freq: Three times a day (TID) | ORAL | 0 refills | Status: DC | PRN

## 2020-05-23 MED ORDER — PREDNISONE 5 MG (21) PO TBPK: ORAL_TABLET | ORAL | 0 refills | Status: DC

## 2020-05-25 HISTORY — PX: BACK SURGERY: SHX140

## 2020-05-26 ENCOUNTER — Encounter: Payer: Self-pay | Admitting: Surgical

## 2020-05-26 NOTE — Progress Notes (Signed)
Office Visit Note   Patient: Ashley [name]           Date of Birth: 06-May-1961           MRN: [id number] Visit Date: 05/23/2020 Requested by: Lindley Magnus, PA-C [address] [place],  [place] [address] PCP: Lindley Magnus, PA-C  Subjective: Chief Complaint  Patient presents with  . Right Leg - Pain    HPI: Ashley [name] is a 60 y.o. female who presents to the office complaining of back pain with right leg radicular pain.  Patient notes she has had increasing right buttocks pain that radiates down the posterior leg into her knee.  She denies any injury leading to onset of pain.  Pain seems to be getting worse and becoming more constant.  She denies any pain with sleeping or pain that wakes her up at night.  Denies any numbness/tingling.  She has difficulty with ambulation at times.  She notes subjective weakness of her right leg at times.  Denies any history of lumbar spine surgery.  She cannot take NSAIDs due to history of gastric bypass.  She does have to occasionally position herself so that she does not sit with pressure on her right buttocks.  She is taking occasional Tylenol for pain control..                ROS: All systems reviewed are negative as they relate to the chief complaint within the history of present illness.  Patient denies fevers or chills.  Assessment & Plan: Visit Diagnoses:  1. Pain in right leg   2. Radicular syndrome of right leg     Plan: Patient is a 60 year old female presents complaining of low back pain with right buttocks pain that travels down to the posterior knee.  Symptoms are worsening over the last month.  Denies any history of injury.  No history of lumbar spine surgery.  No lumbar spine MRI recently.  By her history and exam, impression is radicular pain from the lumbar spine.  She cannot take NSAIDs due to history of gastric bypass.  Prescribe steroid Dosepak with Robaxin.  She also try 1 session of lumbar spine physical therapy with transition  to home exercise program.  Follow-up in 6 weeks for recheck with Dr. Ophelia Charter.  If no improvement at that time, could consider lumbar spine MRI.  Discussed this with the patient and she understands.  Radiographs today were negative for any significant lumbar spine degeneration or any acute osseous injury.  Follow-Up Instructions: No follow-ups on file.   Orders:  Orders Placed This Encounter  Procedures  . XR Lumbar Spine 2-3 Views  . Ambulatory referral to Physical Therapy   Meds ordered this encounter  Medications  . predniSONE (STERAPRED UNI-PAK 21 TAB) 5 MG (21) TBPK tablet    Sig: Take dosepak as directed    Dispense:  21 tablet    Refill:  0  . methocarbamol (ROBAXIN) 500 MG tablet    Sig: Take 1 tablet (500 mg total) by mouth every 8 (eight) hours as needed for muscle spasms.    Dispense:  30 tablet    Refill:  0      Procedures: No procedures performed   Clinical Data: No additional findings.  Objective: Vital Signs: There were no vitals taken for this visit.  Physical Exam:  Constitutional: Patient appears well-developed HEENT:  Head: Normocephalic Eyes:EOM are normal Neck: Normal range of motion Cardiovascular: Normal rate Pulmonary/chest: Effort normal  Neurologic: Patient is alert Skin: Skin is warm Psychiatric: Patient has normal mood and affect  Ortho Exam: Ortho exam demonstrates tenderness throughout the axial lumbar spine and right-sided paraspinal musculature.  Tenderness in the right buttock.  +5 motor strength of the bilateral hip flexors, quadricep, hamstring, dorsiflexion, plantarflexion.  Sensation intact through all dermatomes of bilateral lower extremities.  No clonus noted on exam.  Specialty Comments:  No specialty comments available.  Imaging: No results found.   PMFS History: Patient Active Problem List   Diagnosis Date Noted  . Ganglion cyst of finger 09/21/2019  . Morbid obesity with BMI of 45.0-49.9, adult ([facility]) 04/09/2015  . CTS  (carpal tunnel syndrome) 07/02/2014  . Essential tremor 03/23/2013   Past Medical History:  Diagnosis Date  . Essential tremor   . GERD (gastroesophageal reflux disease)   . History of cholecystectomy   . Hypertension   . Neuropathy   . Palpitations   . Seasonal allergies   . Sinus infection     Family History  Problem Relation Age of Onset  . Colon cancer Neg Hx   . Esophageal cancer Neg Hx   . Rectal cancer Neg Hx   . Stomach cancer Neg Hx     Past Surgical History:  Procedure Laterality Date  . CARPAL TUNNEL RELEASE Right 2005   left 2011  . CESAREAN SECTION  1989  . CHOLECYSTECTOMY  2008  . CORRECTION OF HAMMER TOE REPAIR AND BUNION CORRECTION  2018   both feet  . Hammer Toe Repair Bilateral 09/11/2016   #2 Toe Bilateral   . HEEL SPUR SURGERY Left   . history of neuroplasty decompression median nerve at carpal tunnel    . LAPAROSCOPIC GASTRIC SLEEVE RESECTION WITH HIATAL HERNIA REPAIR  04/09/2015   Procedure: LAPAROSCOPIC GASTRIC SLEEVE RESECTION WITH HIATAL HERNIA REPAIR;  Surgeon: Glenna Fellows, MD;  Location: [facility];  Service: General;;  . thumb joint replacement Bilateral    R- 03/02/17 & L- 07/21/16  . UPPER GI ENDOSCOPY  04/09/2015   Procedure: UPPER GI ENDOSCOPY;  Surgeon: Glenna Fellows, MD;  Location: [facility];  Service: General;;   Social History   Occupational History  . Occupation: Not currently working post-op    Employer: [employer]  Tobacco Use  . Smoking status: Never Smoker  . Smokeless tobacco: Never Used  Vaping Use  . Vaping Use: Never used  Substance and Sexual Activity  . Alcohol use: No  . Drug use: No  . Sexual activity: Not on file

## 2020-06-04 ENCOUNTER — Ambulatory Visit: Payer: 59 | Admitting: Physical Therapy

## 2020-06-10 ENCOUNTER — Ambulatory Visit: Payer: 59 | Admitting: Physical Therapy

## 2020-06-12 ENCOUNTER — Ambulatory Visit: Payer: 59 | Admitting: Physical Therapy

## 2020-06-26 ENCOUNTER — Ambulatory Visit: Payer: 59 | Admitting: Physical Therapy

## 2020-06-26 ENCOUNTER — Other Ambulatory Visit: Payer: Self-pay

## 2020-06-26 ENCOUNTER — Encounter: Payer: Self-pay | Admitting: Physical Therapy

## 2020-06-26 DIAGNOSIS — M25651 Stiffness of right hip, not elsewhere classified: Secondary | ICD-10-CM | POA: Diagnosis not present

## 2020-06-26 DIAGNOSIS — M6281 Muscle weakness (generalized): Secondary | ICD-10-CM | POA: Diagnosis not present

## 2020-06-26 DIAGNOSIS — M545 Low back pain, unspecified: Secondary | ICD-10-CM

## 2020-06-26 DIAGNOSIS — M79604 Pain in right leg: Secondary | ICD-10-CM

## 2020-06-26 DIAGNOSIS — G8929 Other chronic pain: Secondary | ICD-10-CM

## 2020-06-26 NOTE — Therapy (Addendum)
[facility] [address] [place], [place], [address] Phone: [phone]   Fax:  [phone]  Physical Therapy Evaluation  Patient Details  Name: Ashley [name] MRN: [id number] Date of Birth: September 21, 1960 Referring Provider (PT): Harriette Bouillon, [place]   Encounter Date: 06/26/2020   PT End of Session - 06/26/20 0858    Visit Number 1    Number of Visits 6    Date for PT Re-Evaluation 08/07/20    PT Start Time 0807    PT Stop Time 0846    PT Time Calculation (min) 39 min    Activity Tolerance Patient tolerated treatment well;No increased pain    Behavior During Therapy WFL for tasks assessed/performed           Past Medical History:  Diagnosis Date  . Essential tremor   . GERD (gastroesophageal reflux disease)   . History of cholecystectomy   . Hypertension   . Neuropathy   . Palpitations   . Seasonal allergies   . Sinus infection     Past Surgical History:  Procedure Laterality Date  . CARPAL TUNNEL RELEASE Right 2005   left 2011  . CESAREAN SECTION  1989  . CHOLECYSTECTOMY  2008  . CORRECTION OF HAMMER TOE REPAIR AND BUNION CORRECTION  2018   both feet  . Hammer Toe Repair Bilateral 09/11/2016   #2 Toe Bilateral   . HEEL SPUR SURGERY Left   . history of neuroplasty decompression median nerve at carpal tunnel    . LAPAROSCOPIC GASTRIC SLEEVE RESECTION WITH HIATAL HERNIA REPAIR  04/09/2015   Procedure: LAPAROSCOPIC GASTRIC SLEEVE RESECTION WITH HIATAL HERNIA REPAIR;  Surgeon: Glenna Fellows, MD;  Location: [facility];  Service: General;;  . thumb joint replacement Bilateral    R- 03/02/17 & L- 07/21/16  . UPPER GI ENDOSCOPY  04/09/2015   Procedure: UPPER GI ENDOSCOPY;  Surgeon: Glenna Fellows, MD;  Location: [facility];  Service: General;;    There were no vitals filed for this visit.    Subjective Assessment - 06/26/20 0811    Subjective Patient states she has been having Rt leg pain since November. She cannot pinpoint a  specific injury or anything that makes it worse besides sitting for extended periods of time. She works from home full time sitting at her desk, she sits on the edge of the seat to relieve this along with some heat. The steroid medication the Dr. prescribed relieved her pain she was having while she was taking it. She sleeps on her L side and has no troubles sleeping. She is not any pain currently.    Pertinent History HTN, neuropathy    Limitations Sitting    How long can you sit comfortably? she can sit up to 4 hours with constant adjustments but pain comes and goes    Diagnostic tests Xray 05/23/2020: AP and lateral views of lumbar spine reviewed.  No significant scoliosis   noted.  No significant degeneration of the disc spaces throughout the   lumbar spine.  No spondylolisthesis noted.  No compression fractures   noted.    Patient Stated Goals decrease pain, be bale to sit for longer periods without pain    Currently in Pain? No/denies              [facility] PT Assessment - 06/26/20 0001      Assessment   Medical Diagnosis Pain in R Leg    Referring Provider (PT) Harriette Bouillon, PA    Onset Date/Surgical Date  03/26/20    Next MD Visit 07/09/2020    Prior Therapy none      Precautions   Precautions None      Restrictions   Weight Bearing Restrictions No      Balance Screen   Has the patient fallen in the past 6 months No    Has the patient had a decrease in activity level because of a fear of falling?  No    Is the patient reluctant to leave their home because of a fear of falling?  No      Home [employer]ist information centre manager residence      Prior Function   Level of Independence Independent    Vocation Full time employment    Vocation Requirements sitting work from home      Cognition   Overall Cognitive Status Within Functional Limits for tasks assessed      Observation/Other Assessments   Focus on Therapeutic Outcomes (FOTO)  functional intake score 60%,  predicted 71%      Posture/Postural Control   Posture/Postural Control No significant limitations      ROM / Strength   AROM / PROM / Strength AROM;Strength      AROM   AROM Assessment Site Lumbar    Lumbar Flexion WNL    Lumbar Extension 50%    Lumbar - Right Side Bend 75%    Lumbar - Left Side Bend 75%    Lumbar - Right Rotation 80%    Lumbar - Left Rotation 80%      Strength   Strength Assessment Site Hip;Knee    Right/Left Hip Right;Left    Right Hip Flexion 3+/5    Right Hip ABduction 4/5    Right Hip ADduction 4-/5    Left Hip Flexion 3+/5    Left Hip ABduction 4/5    Left Hip ADduction 4-/5    Right/Left Knee Right;Left    Right Knee Flexion 4-/5    Right Knee Extension 4/5    Left Knee Flexion 4-/5    Left Knee Extension 4/5      Palpation   Spinal mobility hypomobility in lumbar region, no pain with CPAs    Palpation comment ischial tuberosity and proximal hamstring tenderness and trigger point, glute med TTP, Right side above illiac crest TTP.      Special Tests    Special Tests Lumbar    Lumbar Tests Straight Leg Raise      Straight Leg Raise   Findings Positive    Side  Right    Comment ~55 degrees                      Objective measurements completed on examination: See above findings.       [facility] Adult PT Treatment/Exercise - 06/26/20 0001      Modalities   Modalities Moist Heat      Moist Heat Therapy   Number Minutes Moist Heat 5 Minutes    Moist Heat Location Hip   posterior R Hip/glute seated     Manual Therapy   Manual Therapy Soft tissue mobilization    Soft tissue mobilization 5 mins to posterior proximal hasmtring and glute med for TP release                  PT Education - 06/26/20 0854    Education Details HEP, POC    Person(s) Educated Patient    Methods Explanation;Handout;Demonstration    Comprehension  Verbalized understanding;Returned demonstration            PT Short Term Goals - 06/26/20  0910      PT SHORT TERM GOAL #1   Title Patient will be I with HEP    Time 3    Period Weeks    Status New    Target Date 07/17/20             PT Long Term Goals - 06/26/20 0911      PT LONG TERM GOAL #1   Title Patient will demonstrate increased LE strength to 4+/5 for increased function    Baseline decreased strength for all motions    Time 6    Period Weeks    Status New    Target Date 08/07/20      PT LONG TERM GOAL #2   Title Patient will be able to tolerate sitting for up to 4 hours without pain increasing above 2/10 with minor adjustments every hour    Baseline pain with sitting every hour    Time 6    Period Weeks    Status New    Target Date 08/07/20      PT LONG TERM GOAL #3   Title Patient will show improved FOTO score    Baseline functional intake score 60%, predicted 71%    Time 6    Period Weeks    Status New    Target Date 08/07/20                  Plan - 06/26/20 0859    Clinical Impression Statement Patient presents as 60 y/o female with primary concern of R sided posterior leg pain with some occasional low back pain that started insidiously 3 months ago. Flares up with extended periods of sitting. She does work form home sitting most of the day. +SLR test on R side and several trigger points along proximal hamstring and glute medius. Patient Lumbar motion is slightly restricted in all planes except flexion. Her LE strength is also decreased in all motions. Symptoms are consistent with possible herniated disc in lumbar region or possible glute/hamstring tendinopathies.  Patient denies N/T or any B/B changes. Her main concern is decreasing pain that is flared up while she is working. She can benefit from Skilled PT to release painful trigger points, increase functional range of motion, and decrease pain through functional strengthening.    Personal Factors and Comorbidities Profession    Examination-Activity Limitations Sit     Examination-Participation Restrictions Occupation    Stability/Clinical Decision Making Stable/Uncomplicated    Clinical Decision Making Low    Rehab Potential Good    PT Frequency 1x / week    PT Duration 6 weeks    PT Treatment/Interventions ADLs/Self Care Home Management;Aquatic Therapy;Biofeedback;Cryotherapy;Electrical Stimulation;Ultrasound;Traction;Moist Heat;Iontophoresis 4mg /ml Dexamethasone;Stair training;Therapeutic exercise;Neuromuscular re-education;Therapeutic activities;Balance training;Functional mobility training;Patient/family education;Passive range of motion;Dry needling;Joint Manipulations;Taping    PT Next Visit Plan check in with HEP, check balance, traction, percussion gun/STM    PT Home Exercise Plan Access Code: [email or web address]    Consulted and Agree with Plan of Care Patient           Patient will benefit from skilled therapeutic intervention in order to improve the following deficits and impairments:  Decreased mobility,Decreased strength,Impaired flexibility,Hypomobility,Pain,Decreased endurance  Visit Diagnosis: Pain in right leg  Chronic midline low back pain without sciatica  Stiffness of right hip, not elsewhere classified  Muscle weakness (generalized)     Problem List Patient  Active Problem List   Diagnosis Date Noted  . Ganglion cyst of finger 09/21/2019  . Morbid obesity with BMI of 45.0-49.9, adult ([facility]CC) 04/09/2015  . CTS (carpal tunnel syndrome) 07/02/2014  . Essential tremor 03/23/2013    Ritta Slot, SPT 06/26/2020, 9:29 AM   During this treatment session, this physical therapist was present, participating in and directing the treatment.   This note has been reviewed and this clinician agrees with the information provided.  Ivery Quale, PT, DPT 06/26/20 10:11 AM   [facility] Therapy [address] [place], [place], [address] Phone: [phone]   Fax:  [phone]  Name: TWYLA DAIS MRN:  [id number] Date of Birth: 02/26/1961

## 2020-06-26 NOTE — Patient Instructions (Signed)
Access Code: [email or web address] URL: [email or web address] Date: 06/26/2020 Prepared by: Ivery Quale  Exercises Supine Hamstring Stretch with Strap - 2 x daily - 7 x weekly - 1 sets - 3 reps - 30 hold Supine Bridge - 2 x daily - 7 x weekly - 2 sets - 10 reps Supine Single Knee to Chest Stretch - 2 x daily - 7 x weekly - 2 sets - 2 reps - 20 sec hold Supine Sciatic Nerve Glide - 2 x daily - 7 x weekly - 2 sets - 10 reps Prone Press Up On Elbows - 2 x daily - 7 x weekly - 1-2 sets - 10 reps - 5 hold Seated March - 2 x daily - 7 x weekly - 2 sets - 10 reps Standing Knee Flexion - 2 x daily - 7 x weekly - 2 sets - 10 reps

## 2020-07-05 ENCOUNTER — Other Ambulatory Visit: Payer: Self-pay

## 2020-07-05 ENCOUNTER — Ambulatory Visit: Payer: 59 | Admitting: Rehabilitative and Restorative Service Providers"

## 2020-07-05 ENCOUNTER — Encounter: Payer: Self-pay | Admitting: Rehabilitative and Restorative Service Providers"

## 2020-07-05 DIAGNOSIS — M545 Low back pain, unspecified: Secondary | ICD-10-CM

## 2020-07-05 DIAGNOSIS — M6281 Muscle weakness (generalized): Secondary | ICD-10-CM | POA: Diagnosis not present

## 2020-07-05 DIAGNOSIS — M25651 Stiffness of right hip, not elsewhere classified: Secondary | ICD-10-CM

## 2020-07-05 DIAGNOSIS — M79604 Pain in right leg: Secondary | ICD-10-CM

## 2020-07-05 DIAGNOSIS — G8929 Other chronic pain: Secondary | ICD-10-CM

## 2020-07-05 NOTE — Patient Instructions (Signed)
Access Code: [email or web address] URL: [email or web address] Date: 07/05/2020 Prepared by: Chyrel Masson  Exercises Supine Hamstring Stretch with Strap - 2 x daily - 7 x weekly - 1 sets - 3 reps - 30 hold Supine Bridge - 2 x daily - 7 x weekly - 2 sets - 10 reps Supine Single Knee to Chest Stretch - 2 x daily - 7 x weekly - 2 sets - 2 reps - 20 sec hold Supine Sciatic Nerve Glide - 2 x daily - 7 x weekly - 2 sets - 10 reps Prone Press Up On Elbows - 2 x daily - 7 x weekly - 1-2 sets - 10 reps - 5 hold

## 2020-07-05 NOTE — Therapy (Signed)
[facility] [place], [place], [address] Phone: [phone]   Fax:  [phone]  Physical Therapy Treatment  Patient Details  Name: Ashley [name] MRN: [id number] Date of Birth: 31-Mar-1961 Referring Provider (PT): Gloriann Loan, [place]   Encounter Date: 07/05/2020   PT End of Session - 07/05/20 0848    Visit Number 2    Number of Visits 6    Date for PT Re-Evaluation 08/07/20    Progress Note Due on Visit 6    PT Start Time 0846    PT Stop Time 0926    PT Time Calculation (min) 40 min    Activity Tolerance Patient tolerated treatment well    Behavior During Therapy [facility] for tasks assessed/performed           Past Medical History:  Diagnosis Date  . Essential tremor   . GERD (gastroesophageal reflux disease)   . History of cholecystectomy   . Hypertension   . Neuropathy   . Palpitations   . Seasonal allergies   . Sinus infection     Past Surgical History:  Procedure Laterality Date  . CARPAL TUNNEL RELEASE Right 2005   left 2011  . CESAREAN SECTION  1989  . CHOLECYSTECTOMY  2008  . CORRECTION OF HAMMER TOE REPAIR AND BUNION CORRECTION  2018   both feet  . Hammer Toe Repair Bilateral 09/11/2016   #2 Toe Bilateral   . HEEL SPUR SURGERY Left   . history of neuroplasty decompression median nerve at carpal tunnel    . LAPAROSCOPIC GASTRIC SLEEVE RESECTION WITH HIATAL HERNIA REPAIR  04/09/2015   Procedure: LAPAROSCOPIC GASTRIC SLEEVE RESECTION WITH HIATAL HERNIA REPAIR;  Surgeon: Excell Seltzer, MD;  Location: [facility];  Service: General;;  . thumb joint replacement Bilateral    R- 03/02/17 & L- 07/21/16  . UPPER GI ENDOSCOPY  04/09/2015   Procedure: UPPER GI ENDOSCOPY;  Surgeon: Excell Seltzer, MD;  Location: [facility];  Service: General;;    There were no vitals filed for this visit.   Subjective Assessment - 07/05/20 0850    Subjective Pt. stated a little better since start.  Pt. stated a little sore today after  sitting prolonged.    Pertinent History HTN, neuropathy    Limitations Sitting    How long can you sit comfortably? she can sit up to 4 hours with constant adjustments but pain comes and goes    Diagnostic tests Xray 05/23/2020: AP and lateral views of lumbar spine reviewed.  No significant scoliosis   noted.  No significant degeneration of the disc spaces throughout the   lumbar spine.  No spondylolisthesis noted.  No compression fractures   noted.    Patient Stated Goals decrease pain, be bale to sit for longer periods without pain    Currently in Pain? Yes    Pain Score 3     Pain Location Leg    Pain Orientation Right   posterior   Pain Descriptors / Indicators Sore    Pain Frequency Intermittent                             [facility] Adult PT Treatment/Exercise - 07/05/20 0001      Exercises   Exercises Lumbar;Knee/Hip      Knee/Hip Exercises: Supine   Bridges 2 sets;10 reps    Other Supine Knee/Hip Exercises Lt LE SLR c contralateral hamstring stretch 2 x 10, Rt SLR normal  2 x 10    Other Supine Knee/Hip Exercises SKC 30 sec x 3 bilateral, supine sciatic nerve flossing c DF c knee flexion, PF c knee extension 2 x 10 Rt      Manual Therapy   Manual therapy comments passive Rt hamstring stretch, MET contract/relax techniques                  PT Education - 07/05/20 0911    Education Details Adjusted HEP c cues, handout for DN/education    Person(s) Educated Patient    Methods Explanation;Demonstration;Verbal cues;Handout    Comprehension Returned demonstration;Verbalized understanding            PT Short Term Goals - 07/05/20 0851      PT SHORT TERM GOAL #1   Title Patient will be I with HEP    Time 3    Period Weeks    Status On-going    Target Date 07/17/20             PT Long Term Goals - 06/26/20 0911      PT LONG TERM GOAL #1   Title Patient will demonstrate increased LE strength to 4+/5 for increased function    Baseline  decreased strength for all motions    Time 6    Period Weeks    Status New    Target Date 08/07/20      PT LONG TERM GOAL #2   Title Patient will be able to tolerate sitting for up to 4 hours without pain increasing above 2/10 with minor adjustments every hour    Baseline pain with sitting every hour    Time 6    Period Weeks    Status New    Target Date 08/07/20      PT LONG TERM GOAL #3   Title Patient will show improved FOTO score    Baseline functional intake score 60%, predicted 71%    Time 6    Period Weeks    Status New    Target Date 08/07/20                 Plan - 07/05/20 0910    Clinical Impression Statement Indications of hamstring tightness restriction as well as neurodynamic tension symptoms in Rt LE compared to Lt at this time as supported by concordant symptoms c passive SLR on Rt, + Slump and sciatic nerve tension testing in supine on Rt).  Pt. gained movement in passive SLR following application of MET.  Reviewed HEP to adjust techniques for progression. Discussion of DN today for possible future use if indicated.    Personal Factors and Comorbidities Profession    Examination-Activity Limitations Sit    Examination-Participation Restrictions Occupation    Stability/Clinical Decision Making Stable/Uncomplicated    Rehab Potential Good    PT Frequency 1x / week    PT Duration 6 weeks    PT Treatment/Interventions ADLs/Self Care Home Management;Aquatic Therapy;Biofeedback;Cryotherapy;Electrical Stimulation;Ultrasound;Traction;Moist Heat;Iontophoresis 68m/ml Dexamethasone;Stair training;Therapeutic exercise;Neuromuscular re-education;Therapeutic activities;Balance training;Functional mobility training;Patient/family education;Passive range of motion;Dry needling;Joint Manipulations;Taping    PT Next Visit Plan Continue to focus on hamstring and neurodynamic mobility of Rt LE at this time, progress strengthening.    PT Home Exercise Plan Access Code: [email or web address]     Consulted and Agree with Plan of Care Patient           Patient will benefit from skilled therapeutic intervention in order to improve the following deficits and impairments:  Decreased mobility,Decreased strength,Impaired flexibility,Hypomobility,Pain,Decreased endurance  Visit Diagnosis: Pain in right leg  Chronic midline low back pain without sciatica  Stiffness of right hip, not elsewhere classified  Muscle weakness (generalized)     Problem List Patient Active Problem List   Diagnosis Date Noted  . Ganglion cyst of finger 09/21/2019  . Morbid obesity with BMI of 45.0-49.9, adult ([place]) 04/09/2015  . CTS (carpal tunnel syndrome) 07/02/2014  . Essential tremor 03/23/2013    Scot Jun, PT, DPT, OCS, ATC 07/05/20  9:26 AM    [facility] Physical Therapy [address] [place], [place], [address] Phone: [phone]   Fax:  [phone]  Name: JAVEAH LOEZA MRN: [id number] Date of Birth: 03/25/1961

## 2020-07-09 ENCOUNTER — Ambulatory Visit (INDEPENDENT_AMBULATORY_CARE_PROVIDER_SITE_OTHER): Payer: 59 | Admitting: Rehabilitative and Restorative Service Providers"

## 2020-07-09 ENCOUNTER — Other Ambulatory Visit: Payer: Self-pay

## 2020-07-09 ENCOUNTER — Encounter: Payer: Self-pay | Admitting: Rehabilitative and Restorative Service Providers"

## 2020-07-09 ENCOUNTER — Ambulatory Visit: Payer: 59 | Admitting: Orthopaedic Surgery

## 2020-07-09 DIAGNOSIS — G8929 Other chronic pain: Secondary | ICD-10-CM

## 2020-07-09 DIAGNOSIS — M79604 Pain in right leg: Secondary | ICD-10-CM

## 2020-07-09 DIAGNOSIS — M545 Low back pain, unspecified: Secondary | ICD-10-CM | POA: Diagnosis not present

## 2020-07-09 DIAGNOSIS — M5416 Radiculopathy, lumbar region: Secondary | ICD-10-CM | POA: Diagnosis not present

## 2020-07-09 DIAGNOSIS — M6281 Muscle weakness (generalized): Secondary | ICD-10-CM | POA: Diagnosis not present

## 2020-07-09 DIAGNOSIS — M25651 Stiffness of right hip, not elsewhere classified: Secondary | ICD-10-CM

## 2020-07-09 NOTE — Progress Notes (Signed)
Office Visit Note   Patient: Ashley [name]           Date of Birth: Dec 04, 1960           MRN: [id number] Visit Date: 07/09/2020              Requested by: Rick Duff, PA-C [address] [place],  [place] [address] PCP: Rick Duff, PA-C   Assessment & Plan: Visit Diagnoses:  1. Lumbar radicular pain     Plan: Patient had CT scan abdomen which showed foraminal narrowing on the right at L5-S1 level not present on left. She cannot take anti-inflammatories due to gastric procedure. Muscle relaxants has helped. She is continuing therapy. I will recheck her in 2 months. She likely has some disc bulge with lateral recess/foraminal compression on the right side. Her pain is intermittent. Hopefully her symptoms will settle down otherwise will consider MRI imaging lumbar spine.  Follow-Up Instructions: Return in about 2 months (around 09/06/2020).   Orders:  No orders of the defined types were placed in this encounter.  No orders of the defined types were placed in this encounter.     Procedures: No procedures performed   Clinical Data: No additional findings.   Subjective: Chief Complaint  Patient presents with  . Lower Back - Pain, Follow-up    HPI 60 year old female returns with intermittent problems with back pain right leg pain without left leg pain. Pain seems to be worse with prolonged standing or walking. She is currently in therapy. She states her pain tends to come and go, variable intensity. If she does turning or twisting or stands or walks for longer period of time she has increased pain. She states she does better leaning over a grocery cart. Positive for morbid obesity previous hernia surgery also gastric bypass surgery. She cannot take anti-inflammatories. Gastric sleeve resection with hiatal hernia repair was in 2016. She is adverse to carpal metacarpal arthroplasty on the left hand and also right hand 2018.  Review of Systems all other systems  updated and noncontributory to HPI.   Objective: Vital Signs: BP (!) 122/57   Pulse 60   Physical Exam Constitutional:      Appearance: She is well-developed.  HENT:     Head: Normocephalic.     Right Ear: External ear normal.     Left Ear: External ear normal.  Eyes:     Pupils: Pupils are equal, round, and reactive to light.  Neck:     Thyroid: No thyromegaly.     Trachea: No tracheal deviation.  Cardiovascular:     Rate and Rhythm: Normal rate.  Pulmonary:     Effort: Pulmonary effort is normal.  Abdominal:     Palpations: Abdomen is soft.  Skin:    General: Skin is warm and dry.  Neurological:     Mental Status: She is alert and oriented to person, place, and time.  Psychiatric:        Mood and Affect: Mood and affect normal.        Behavior: Behavior normal.     Ortho Exam patient slow getting from sitting standing she has some trochanteric bursal tenderness on the right positive setting notch tenderness on the right positive straight leg raising on the right at 70 degrees negative on the left. Negative logroll the hips. Knees reach full extension. Reflexes are intact.  Specialty Comments:  No specialty comments available.  Imaging: No results found.   PMFS History:  Patient Active Problem List   Diagnosis Date Noted  . Lumbar radicular pain 07/09/2020  . Ganglion cyst of finger 09/21/2019  . Morbid obesity with BMI of 45.0-49.9, adult ([facility]CC) 04/09/2015  . CTS (carpal tunnel syndrome) 07/02/2014  . Essential tremor 03/23/2013   Past Medical History:  Diagnosis Date  . Essential tremor   . GERD (gastroesophageal reflux disease)   . History of cholecystectomy   . Hypertension   . Neuropathy   . Palpitations   . Seasonal allergies   . Sinus infection     Family History  Problem Relation Age of Onset  . Colon cancer Neg Hx   . Esophageal cancer Neg Hx   . Rectal cancer Neg Hx   . Stomach cancer Neg Hx     Past Surgical History:  Procedure  Laterality Date  . CARPAL TUNNEL RELEASE Right 2005   left 2011  . CESAREAN SECTION  1989  . CHOLECYSTECTOMY  2008  . CORRECTION OF HAMMER TOE REPAIR AND BUNION CORRECTION  2018   both feet  . Hammer Toe Repair Bilateral 09/11/2016   #2 Toe Bilateral   . HEEL SPUR SURGERY Left   . history of neuroplasty decompression median nerve at carpal tunnel    . LAPAROSCOPIC GASTRIC SLEEVE RESECTION WITH HIATAL HERNIA REPAIR  04/09/2015   Procedure: LAPAROSCOPIC GASTRIC SLEEVE RESECTION WITH HIATAL HERNIA REPAIR;  Surgeon: Glenna Fellows, MD;  Location: [facility];  Service: General;;  . thumb joint replacement Bilateral    R- 03/02/17 & L- 07/21/16  . UPPER GI ENDOSCOPY  04/09/2015   Procedure: UPPER GI ENDOSCOPY;  Surgeon: Glenna Fellows, MD;  Location: [facility];  Service: General;;   Social History   Occupational History  . Occupation: Not currently working post-op    Employer: [employer]  Tobacco Use  . Smoking status: Never Smoker  . Smokeless tobacco: Never Used  Vaping Use  . Vaping Use: Never used  Substance and Sexual Activity  . Alcohol use: No  . Drug use: No  . Sexual activity: Not on file

## 2020-07-09 NOTE — Therapy (Signed)
[place], [place], [address] Phone: [phone]   Fax:  [phone]  Physical Therapy Treatment  Patient Details  Name: Ashley [name] MRN: [id number] Date of Birth: 17-Jan-1961 Referring Provider (PT): Gloriann Loan, [place]   Encounter Date: 07/09/2020   PT End of Session - 07/09/20 0934    Visit Number 3    Number of Visits 6    Date for PT Re-Evaluation 08/07/20    Progress Note Due on Visit 6    PT Start Time 0923    PT Stop Time 1005    PT Time Calculation (min) 42 min    Activity Tolerance Patient tolerated treatment well    Behavior During Therapy [facility] for tasks assessed/performed           Past Medical History:  Diagnosis Date  . Essential tremor   . GERD (gastroesophageal reflux disease)   . History of cholecystectomy   . Hypertension   . Neuropathy   . Palpitations   . Seasonal allergies   . Sinus infection     Past Surgical History:  Procedure Laterality Date  . CARPAL TUNNEL RELEASE Right 2005   left 2011  . CESAREAN SECTION  1989  . CHOLECYSTECTOMY  2008  . CORRECTION OF HAMMER TOE REPAIR AND BUNION CORRECTION  2018   both feet  . Hammer Toe Repair Bilateral 09/11/2016   #2 Toe Bilateral   . HEEL SPUR SURGERY Left   . history of neuroplasty decompression median nerve at carpal tunnel    . LAPAROSCOPIC GASTRIC SLEEVE RESECTION WITH HIATAL HERNIA REPAIR  04/09/2015   Procedure: LAPAROSCOPIC GASTRIC SLEEVE RESECTION WITH HIATAL HERNIA REPAIR;  Surgeon: Excell Seltzer, MD;  Location: [facility];  Service: General;;  . thumb joint replacement Bilateral    R- 03/02/17 & L- 07/21/16  . UPPER GI ENDOSCOPY  04/09/2015   Procedure: UPPER GI ENDOSCOPY;  Surgeon: Excell Seltzer, MD;  Location: [facility];  Service: General;;    There were no vitals filed for this visit.   Subjective Assessment - 07/09/20 0932    Subjective Pt. stated feeling soreness in front of legs after last visit.  Pt. stated back  of leg was 6/10 pain today following MD visit and "poking around."    Pertinent History HTN, neuropathy    Limitations Sitting    How long can you sit comfortably? she can sit up to 4 hours with constant adjustments but pain comes and goes    Diagnostic tests Xray 05/23/2020: AP and lateral views of lumbar spine reviewed.  No significant scoliosis   noted.  No significant degeneration of the disc spaces throughout the   lumbar spine.  No spondylolisthesis noted.  No compression fractures   noted.    Patient Stated Goals decrease pain, be bale to sit for longer periods without pain    Currently in Pain? Yes    Pain Score 6     Pain Location Leg    Pain Orientation Right    Pain Descriptors / Indicators Sore;Aching    Pain Type Chronic pain    Pain Frequency Intermittent    Aggravating Factors  intermittent onset, getting up after sitting prolonged                             [facility] Adult PT Treatment/Exercise - 07/09/20 0001      Knee/Hip Exercises: Aerobic   Nustep Lvl 5 10 mins LE/UE  Knee/Hip Exercises: Seated   Hamstring Curl 3 sets;10 reps   gren tband     Knee/Hip Exercises: Supine   Bridges 2 sets;10 reps    Other Supine Knee/Hip Exercises Lt LE SLR c contralateral hamstring stretch 2 x 10      Manual Therapy   Manual therapy comments passive Rt hamstring stretch, MET contract/relax techniques            Trigger Point Dry Needling - 07/09/20 0001    Consent Given? Yes    Education Handout Provided Yes    Muscles Treated Lower Quadrant Hamstring   Rt   Hamstring Response Twitch response elicited                  PT Short Term Goals - 07/05/20 0851      PT SHORT TERM GOAL #1   Title Patient will be I with HEP    Time 3    Period Weeks    Status On-going    Target Date 07/17/20             PT Long Term Goals - 06/26/20 0911      PT LONG TERM GOAL #1   Title Patient will demonstrate increased LE strength to 4+/5 for increased  function    Baseline decreased strength for all motions    Time 6    Period Weeks    Status New    Target Date 08/07/20      PT LONG TERM GOAL #2   Title Patient will be able to tolerate sitting for up to 4 hours without pain increasing above 2/10 with minor adjustments every hour    Baseline pain with sitting every hour    Time 6    Period Weeks    Status New    Target Date 08/07/20      PT LONG TERM GOAL #3   Title Patient will show improved FOTO score    Baseline functional intake score 60%, predicted 71%    Time 6    Period Weeks    Status New    Target Date 08/07/20                 Plan - 07/09/20 0955    Clinical Impression Statement Pt. indicated localized familiar concordant symptoms from Trigger points in Rt mid to proximal 1/3 of central hamstring.  Good tolerace overall to manual intervention.  Continued focus on lower back and hip/thigh related movement restrictions and strength gains indicated for future viists.    Personal Factors and Comorbidities Profession    Examination-Activity Limitations Sit    Examination-Participation Restrictions Occupation    Stability/Clinical Decision Making Stable/Uncomplicated    Rehab Potential Good    PT Frequency 1x / week    PT Duration 6 weeks    PT Treatment/Interventions ADLs/Self Care Home Management;Aquatic Therapy;Biofeedback;Cryotherapy;Electrical Stimulation;Ultrasound;Traction;Moist Heat;Iontophoresis 83m/ml Dexamethasone;Stair training;Therapeutic exercise;Neuromuscular re-education;Therapeutic activities;Balance training;Functional mobility training;Patient/family education;Passive range of motion;Dry needling;Joint Manipulations;Taping    PT Next Visit Plan Continue to focus on hamstring and neurodynamic mobility of Rt LE at this time, progress strengthening. reassess dry needling response    PT Home Exercise Plan Access Code: [email or web address]    Consulted and Agree with Plan of Care Patient           Patient will  benefit from skilled therapeutic intervention in order to improve the following deficits and impairments:  Decreased mobility,Decreased strength,Impaired flexibility,Hypomobility,Pain,Decreased endurance  Visit Diagnosis: Pain in right leg  Chronic midline  low back pain without sciatica  Stiffness of right hip, not elsewhere classified  Muscle weakness (generalized)     Problem List Patient Active Problem List   Diagnosis Date Noted  . Lumbar radicular pain 07/09/2020  . Ganglion cyst of finger 09/21/2019  . Morbid obesity with BMI of 45.0-49.9, adult ([place]) 04/09/2015  . CTS (carpal tunnel syndrome) 07/02/2014  . Essential tremor 03/23/2013   Scot Jun, PT, DPT, OCS, ATC 07/09/20  9:59 AM    [facility] [address] [place], [place], [address] Phone: [phone]   Fax:  [phone]  Name: MONTANNA MCBAIN MRN: [id number] Date of Birth: 11/25/1960

## 2020-07-15 ENCOUNTER — Encounter: Payer: 59 | Admitting: Rehabilitative and Restorative Service Providers"

## 2020-07-22 ENCOUNTER — Encounter: Payer: Self-pay | Admitting: Rehabilitative and Restorative Service Providers"

## 2020-07-22 ENCOUNTER — Other Ambulatory Visit: Payer: Self-pay

## 2020-07-22 ENCOUNTER — Ambulatory Visit (INDEPENDENT_AMBULATORY_CARE_PROVIDER_SITE_OTHER): Payer: 59 | Admitting: Rehabilitative and Restorative Service Providers"

## 2020-07-22 DIAGNOSIS — M25651 Stiffness of right hip, not elsewhere classified: Secondary | ICD-10-CM | POA: Diagnosis not present

## 2020-07-22 DIAGNOSIS — M545 Low back pain, unspecified: Secondary | ICD-10-CM

## 2020-07-22 DIAGNOSIS — M6281 Muscle weakness (generalized): Secondary | ICD-10-CM | POA: Diagnosis not present

## 2020-07-22 DIAGNOSIS — M79604 Pain in right leg: Secondary | ICD-10-CM | POA: Diagnosis not present

## 2020-07-22 DIAGNOSIS — G8929 Other chronic pain: Secondary | ICD-10-CM

## 2020-07-22 NOTE — Therapy (Signed)
[facility] Physical Therapy [address] [place], [place], [address] Phone: [phone]   Fax:  [phone]  Physical Therapy Treatment  Patient Details  Name: Ashley [name] MRN: [id number] Date of Birth: 09-11-1960 Referring Provider (PT): Harriette Bouillon, [place]   Encounter Date: 07/22/2020   PT End of Session - 07/22/20 1275    Visit Number 4    Number of Visits 6    Date for PT Re-Evaluation 08/07/20    Progress Note Due on Visit 6    PT Start Time 0806    PT Stop Time 0845    PT Time Calculation (min) 39 min    Activity Tolerance Patient tolerated treatment well    Behavior During Therapy [facility] for tasks assessed/performed           Past Medical History:  Diagnosis Date  . Essential tremor   . GERD (gastroesophageal reflux disease)   . History of cholecystectomy   . Hypertension   . Neuropathy   . Palpitations   . Seasonal allergies   . Sinus infection     Past Surgical History:  Procedure Laterality Date  . CARPAL TUNNEL RELEASE Right 2005   left 2011  . CESAREAN SECTION  1989  . CHOLECYSTECTOMY  2008  . CORRECTION OF HAMMER TOE REPAIR AND BUNION CORRECTION  2018   both feet  . Hammer Toe Repair Bilateral 09/11/2016   #2 Toe Bilateral   . HEEL SPUR SURGERY Left   . history of neuroplasty decompression median nerve at carpal tunnel    . LAPAROSCOPIC GASTRIC SLEEVE RESECTION WITH HIATAL HERNIA REPAIR  04/09/2015   Procedure: LAPAROSCOPIC GASTRIC SLEEVE RESECTION WITH HIATAL HERNIA REPAIR;  Surgeon: Glenna Fellows, MD;  Location: [facility];  Service: General;;  . thumb joint replacement Bilateral    R- 03/02/17 & L- 07/21/16  . UPPER GI ENDOSCOPY  04/09/2015   Procedure: UPPER GI ENDOSCOPY;  Surgeon: Glenna Fellows, MD;  Location: [facility];  Service: General;;    There were no vitals filed for this visit.   Subjective Assessment - 07/22/20 0814    Subjective Pt. stated she felt like she was doing about 80% better overall.  Pt. stated  while her walking distance has improved, she felt complaints with 2 hours of walking in store, creating some shooting complaints at times.  Improved c some time and rest.  She stated she felt like last visit with treatment to hamstring was helpful.    Pertinent History HTN, neuropathy    Limitations Sitting    How long can you sit comfortably? she can sit up to 4 hours with constant adjustments but pain comes and goes    Diagnostic tests Xray 05/23/2020: AP and lateral views of lumbar spine reviewed.  No significant scoliosis   noted.  No significant degeneration of the disc spaces throughout the   lumbar spine.  No spondylolisthesis noted.  No compression fractures   noted.    Patient Stated Goals decrease pain, be bale to sit for longer periods without pain    Currently in Pain? No/denies    Pain Score 0-No pain    Pain Location Leg    Pain Orientation Right    Pain Descriptors / Indicators Shooting    Pain Frequency Intermittent    Aggravating Factors  walking prolonged              [facility] PT Assessment - 07/22/20 0001      Assessment   Medical Diagnosis Pain in R  Leg    Referring Provider (PT) Harriette Bouillon, PA    Onset Date/Surgical Date 03/26/20      Strength   Right Knee Flexion 5/5    Right Knee Extension 5/5    Left Knee Flexion 5/5    Left Knee Extension 5/5      Straight Leg Raise   Side  Right    Comment 65   tightness in middle hamstring reported                        [facility] Adult PT Treatment/Exercise - 07/22/20 0001      Knee/Hip Exercises: Stretches   Passive Hamstring Stretch 5 reps;30 seconds   supine c strap     Knee/Hip Exercises: Aerobic   Nustep Lvl 5 10 mins, rpm goal 60      Knee/Hip Exercises: Machines for Strengthening   Cybex Leg Press SL 62 lbs 3 x 10, performed bilateral      Knee/Hip Exercises: Seated   Hamstring Curl 3 sets;10 reps;Right   green band eccentric control focus     Knee/Hip Exercises: Supine   Bridges  Both;10 reps   5 sec hold, no UE on table                   PT Short Term Goals - 07/05/20 0851      PT SHORT TERM GOAL #1   Title Patient will be I with HEP    Time 3    Period Weeks    Status On-going    Target Date 07/17/20             PT Long Term Goals - 06/26/20 0911      PT LONG TERM GOAL #1   Title Patient will demonstrate increased LE strength to 4+/5 for increased function    Baseline decreased strength for all motions    Time 6    Period Weeks    Status New    Target Date 08/07/20      PT LONG TERM GOAL #2   Title Patient will be able to tolerate sitting for up to 4 hours without pain increasing above 2/10 with minor adjustments every hour    Baseline pain with sitting every hour    Time 6    Period Weeks    Status New    Target Date 08/07/20      PT LONG TERM GOAL #3   Title Patient will show improved FOTO score    Baseline functional intake score 60%, predicted 71%    Time 6    Period Weeks    Status New    Target Date 08/07/20                 Plan - 07/22/20 0842    Clinical Impression Statement Pt. stated she was doing about 80% better to this point.  Still having some complaints in walking but duration of walking prior has improved.  See objective data for updated information showing gains in strength and movement.  Continued skilled PT services indicated at this time to continue progression, dry needling prn.    Personal Factors and Comorbidities Profession    Examination-Activity Limitations Sit    Examination-Participation Restrictions Occupation    Stability/Clinical Decision Making Stable/Uncomplicated    Rehab Potential Good    PT Frequency 1x / week    PT Duration 6 weeks    PT Treatment/Interventions ADLs/Self Care Home Management;Aquatic Therapy;Biofeedback;Cryotherapy;Lobbyist  Stimulation;Ultrasound;Traction;Moist Heat;Iontophoresis 4mg /ml Dexamethasone;Stair training;Therapeutic exercise;Neuromuscular  re-education;Therapeutic activities;Balance training;Functional mobility training;Patient/family education;Passive range of motion;Dry needling;Joint Manipulations;Taping    PT Next Visit Plan Continue to focus on hamstring and neurodynamic mobility of Rt LE at this time, progress strengthening. dry needling prn    PT Home Exercise Plan Access Code: [email or web address]    Consulted and Agree with Plan of Care Patient           Patient will benefit from skilled therapeutic intervention in order to improve the following deficits and impairments:  Decreased mobility,Decreased strength,Impaired flexibility,Hypomobility,Pain,Decreased endurance  Visit Diagnosis: Pain in right leg  Chronic midline low back pain without sciatica  Stiffness of right hip, not elsewhere classified  Muscle weakness (generalized)     Problem List Patient Active Problem List   Diagnosis Date Noted  . Lumbar radicular pain 07/09/2020  . Ganglion cyst of finger 09/21/2019  . Morbid obesity with BMI of 45.0-49.9, adult ([facility]) 04/09/2015  . CTS (carpal tunnel syndrome) 07/02/2014  . Essential tremor 03/23/2013   03/25/2013, PT, DPT, OCS, ATC 07/22/20  8:44 AM    [facility] Physical Therapy [address] [place], [place], [place] Phone: [phone]   Fax:  [phone]  Name: CYBELE MAULE MRN: Filbert Berthold Date of Birth: February 26, 1961

## 2020-07-29 ENCOUNTER — Encounter: Payer: 59 | Admitting: Rehabilitative and Restorative Service Providers"

## 2020-08-01 ENCOUNTER — Encounter: Payer: 59 | Admitting: Rehabilitative and Restorative Service Providers"

## 2020-08-05 ENCOUNTER — Ambulatory Visit (INDEPENDENT_AMBULATORY_CARE_PROVIDER_SITE_OTHER): Payer: 59 | Admitting: Rehabilitative and Restorative Service Providers"

## 2020-08-05 ENCOUNTER — Other Ambulatory Visit: Payer: Self-pay

## 2020-08-05 ENCOUNTER — Encounter: Payer: Self-pay | Admitting: Rehabilitative and Restorative Service Providers"

## 2020-08-05 DIAGNOSIS — M545 Low back pain, unspecified: Secondary | ICD-10-CM

## 2020-08-05 DIAGNOSIS — M79604 Pain in right leg: Secondary | ICD-10-CM | POA: Diagnosis not present

## 2020-08-05 DIAGNOSIS — M6281 Muscle weakness (generalized): Secondary | ICD-10-CM | POA: Diagnosis not present

## 2020-08-05 DIAGNOSIS — M25651 Stiffness of right hip, not elsewhere classified: Secondary | ICD-10-CM

## 2020-08-05 DIAGNOSIS — G8929 Other chronic pain: Secondary | ICD-10-CM

## 2020-08-05 NOTE — Therapy (Signed)
[place], [place], [address] Phone: [phone]   Fax:  [phone]  Physical Therapy Treatment/Discharge  Patient Details  Name: Ashley [name] MRN: [id number] Date of Birth: [birth year] Referring Provider (PT): Gloriann Loan, [place]   Encounter Date: 08/05/2020   PHYSICAL THERAPY DISCHARGE SUMMARY  Visits from Start of Care: 5  Current functional level related to goals / functional outcomes: See note   Remaining deficits: See note   Education / Equipment: HEP Plan: Patient agrees to discharge.  Patient goals were met. Patient is being discharged due to meeting the stated rehab goals.  ?????        PT End of Session - 08/05/20 1528    Visit Number 5    Number of Visits 6    Date for PT Re-Evaluation 08/07/20    Progress Note Due on Visit 6    PT Start Time 1519    PT Stop Time 1545    PT Time Calculation (min) 26 min    Activity Tolerance Patient tolerated treatment well    Behavior During Therapy [facility] for tasks assessed/performed           Past Medical History:  Diagnosis Date  . Essential tremor   . GERD (gastroesophageal reflux disease)   . History of cholecystectomy   . Hypertension   . Neuropathy   . Palpitations   . Seasonal allergies   . Sinus infection     Past Surgical History:  Procedure Laterality Date  . CARPAL TUNNEL RELEASE Right 2005   left 2011  . CESAREAN SECTION  1989  . CHOLECYSTECTOMY  2008  . CORRECTION OF HAMMER TOE REPAIR AND BUNION CORRECTION  2018   both feet  . Hammer Toe Repair Bilateral 09/11/2016   #2 Toe Bilateral   . HEEL SPUR SURGERY Left   . history of neuroplasty decompression median nerve at carpal tunnel    . LAPAROSCOPIC GASTRIC SLEEVE RESECTION WITH HIATAL HERNIA REPAIR  04/09/2015   Procedure: LAPAROSCOPIC GASTRIC SLEEVE RESECTION WITH HIATAL HERNIA REPAIR;  Surgeon: Excell Seltzer, MD;  Location: [facility];  Service: General;;  . thumb joint  replacement Bilateral    R- 03/02/17 & L- 07/21/16  . UPPER GI ENDOSCOPY  04/09/2015   Procedure: UPPER GI ENDOSCOPY;  Surgeon: Excell Seltzer, MD;  Location: [facility];  Service: General;;    There were no vitals filed for this visit.   Subjective Assessment - 08/05/20 1523    Subjective Pt. indicated no specific pain complaints upon arrival today, no pain in last week.  Has returned to work.    Pertinent History HTN, neuropathy    Limitations Sitting    How long can you sit comfortably? she can sit up to 4 hours with constant adjustments but pain comes and goes    Diagnostic tests Xray 05/23/2020: AP and lateral views of lumbar spine reviewed.  No significant scoliosis   noted.  No significant degeneration of the disc spaces throughout the   lumbar spine.  No spondylolisthesis noted.  No compression fractures   noted.    Patient Stated Goals decrease pain, be bale to sit for longer periods without pain    Currently in Pain? No/denies    Pain Score 0-No pain    Pain Location Leg              [facility] PT Assessment - 08/05/20 0001      Assessment   Medical Diagnosis Pain in R Leg  Referring Provider (PT) Gloriann Loan, PA    Onset Date/Surgical Date 03/26/20      Observation/Other Assessments   Focus on Therapeutic Outcomes (FOTO)  72% update      Strength   Right Hip Flexion 5/5    Right Hip ABduction 4+/5    Right Hip ADduction 4+/5    Left Hip Flexion 5/5    Right Knee Flexion 5/5    Right Knee Extension 5/5    Left Knee Flexion 5/5    Left Knee Extension 5/5                         [facility] Adult PT Treatment/Exercise - 08/05/20 0001      Exercises   Exercises Other Exercises    Other Exercises  HEP review c time spent on cues, question/answer about techniques      Knee/Hip Exercises: Stretches   Passive Hamstring Stretch 1 rep;30 seconds   Rt LE     Knee/Hip Exercises: Aerobic   Nustep Lvl 5 10 mins      Knee/Hip Exercises: Supine   Bridges 15  reps    Other Supine Knee/Hip Exercises SLR bilateral x 10 each c review for SLR c contralateral hamstring stretch                  PT Education - 08/05/20 1526    Education Details HEP review for D/C.    Person(s) Educated Patient    Methods Explanation;Verbal cues    Comprehension Verbalized understanding;Returned demonstration            PT Short Term Goals - 08/05/20 1527      PT SHORT TERM GOAL #1   Title Patient will be I with HEP    Time 3    Period Weeks    Status Achieved    Target Date 07/17/20             PT Long Term Goals - 08/05/20 1527      PT LONG TERM GOAL #1   Title Patient will demonstrate increased LE strength to 4+/5 for increased function    Time 6    Period Weeks    Status Achieved      PT LONG TERM GOAL #2   Title Patient will be able to tolerate sitting for up to 4 hours without pain increasing above 2/10 with minor adjustments every hour    Time 6    Period Weeks    Status Achieved      PT LONG TERM GOAL #3   Title Patient will show improved FOTO score    Time 6    Period Weeks    Status Achieved                 Plan - 08/05/20 1527    Clinical Impression Statement Pt. has attended 5 visits overall during course of treatment, reporting 0/10 pain in last week.  See objective data for updated information regarding current presentation.  Pt. has reached established goals at this time.  Recommend d/c to HEP.    Personal Factors and Comorbidities Profession    Examination-Activity Limitations Sit    Examination-Participation Restrictions Occupation    Stability/Clinical Decision Making Stable/Uncomplicated    Rehab Potential Good    PT Frequency 1x / week    PT Duration 6 weeks    PT Treatment/Interventions ADLs/Self Care Home Management;Aquatic Therapy;Biofeedback;Cryotherapy;Electrical Stimulation;Ultrasound;Traction;Moist Heat;Iontophoresis 100m/ml Dexamethasone;Stair training;Therapeutic exercise;Neuromuscular  re-education;Therapeutic activities;Balance  training;Functional mobility training;Patient/family education;Passive range of motion;Dry needling;Joint Manipulations;Taping    PT Next Visit Plan D/C to HEP    PT Home Exercise Plan Access Code: [email or web address]    Consulted and Agree with Plan of Care Patient           Patient will benefit from skilled therapeutic intervention in order to improve the following deficits and impairments:  Decreased mobility,Decreased strength,Impaired flexibility,Hypomobility,Pain,Decreased endurance  Visit Diagnosis: Pain in right leg  Chronic midline low back pain without sciatica  Stiffness of right hip, not elsewhere classified  Muscle weakness (generalized)     Problem List Patient Active Problem List   Diagnosis Date Noted  . Lumbar radicular pain 07/09/2020  . Ganglion cyst of finger 09/21/2019  . Morbid obesity with BMI of 45.0-49.9, adult ([place]) 04/09/2015  . CTS (carpal tunnel syndrome) 07/02/2014  . Essential tremor 03/23/2013    Scot Jun, PT, DPT, OCS, ATC 08/05/20  3:45 PM    [facility] [address] [place], [place], [address] Phone: [phone]   Fax:  [phone]  Name: Ashley [name] MRN: [id number] Date of Birth: [birth year]

## 2020-09-03 ENCOUNTER — Encounter: Payer: Self-pay | Admitting: Podiatry

## 2020-09-03 ENCOUNTER — Ambulatory Visit: Payer: 59 | Admitting: Podiatry

## 2020-09-03 ENCOUNTER — Ambulatory Visit (INDEPENDENT_AMBULATORY_CARE_PROVIDER_SITE_OTHER): Payer: 59

## 2020-09-03 ENCOUNTER — Other Ambulatory Visit: Payer: Self-pay

## 2020-09-03 DIAGNOSIS — M2042 Other hammer toe(s) (acquired), left foot: Secondary | ICD-10-CM

## 2020-09-03 DIAGNOSIS — T8460XA Infection and inflammatory reaction due to internal fixation device of unspecified site, initial encounter: Secondary | ICD-10-CM | POA: Diagnosis not present

## 2020-09-03 DIAGNOSIS — M2032 Hallux varus (acquired), left foot: Secondary | ICD-10-CM

## 2020-09-03 DIAGNOSIS — M2031 Hallux varus (acquired), right foot: Secondary | ICD-10-CM

## 2020-09-03 DIAGNOSIS — M2030 Hallux varus (acquired), unspecified foot: Secondary | ICD-10-CM

## 2020-09-03 DIAGNOSIS — M2041 Other hammer toe(s) (acquired), right foot: Secondary | ICD-10-CM

## 2020-09-03 NOTE — Progress Notes (Signed)
Subjective:  Patient ID: Ashley [name], female    DOB: 1960-08-19,  MRN: [id number] HPI Chief Complaint  Patient presents with  . Toe Pain    Hallux bilateral - previous surgery 2018 - 1st surgery failed, then went to Dr. Zenda Alpers corrected - last year started hurting more frequent, feels numb, rubs shoes  . New Patient (Initial Visit)    60 y.o. female presents with the above complaint.   ROS: Denies fever chills nausea vomiting muscle aches pains calf pain back pain chest pain shortness of breath.  Past Medical History:  Diagnosis Date  . Essential tremor   . GERD (gastroesophageal reflux disease)   . History of cholecystectomy   . Hypertension   . Neuropathy   . Palpitations   . Seasonal allergies   . Sinus infection    Past Surgical History:  Procedure Laterality Date  . CARPAL TUNNEL RELEASE Right 2005   left 2011  . CESAREAN SECTION  1989  . CHOLECYSTECTOMY  2008  . CORRECTION OF HAMMER TOE REPAIR AND BUNION CORRECTION  2018   both feet  . Hammer Toe Repair Bilateral 09/11/2016   #2 Toe Bilateral   . HEEL SPUR SURGERY Left   . history of neuroplasty decompression median nerve at carpal tunnel    . LAPAROSCOPIC GASTRIC SLEEVE RESECTION WITH HIATAL HERNIA REPAIR  04/09/2015   Procedure: LAPAROSCOPIC GASTRIC SLEEVE RESECTION WITH HIATAL HERNIA REPAIR;  Surgeon: Glenna Fellows, MD;  Location: [facility];  Service: General;;  . thumb joint replacement Bilateral    R- 03/02/17 & L- 07/21/16  . UPPER GI ENDOSCOPY  04/09/2015   Procedure: UPPER GI ENDOSCOPY;  Surgeon: Glenna Fellows, MD;  Location: [facility];  Service: General;;    Current Outpatient Medications:  .  CALCIUM CITRATE PO, Take 1 tablet by mouth 2 (two) times daily., Disp: , Rfl:  .  Cyanocobalamin 500 MCG SUBL, Place 500 mcg under the tongue daily., Disp: , Rfl:  .  dicyclomine (BENTYL) 10 MG capsule, Take 10 mg by mouth 4 (four) times daily as needed for spasms., Disp: , Rfl:  .  flintstones complete  (FLINTSTONES) 60 MG chewable tablet, Chew 1 tablet by mouth daily., Disp: , Rfl:  .  fluticasone (FLONASE) 50 MCG/ACT nasal spray, Place 1 spray into the nose 2 (two) times daily. , Disp: , Rfl:  .  levocetirizine (XYZAL) 5 MG tablet, Take 5 mg by mouth daily., Disp: , Rfl:  .  montelukast (SINGULAIR) 10 MG tablet, Take 10 mg by mouth at bedtime., Disp: , Rfl:  .  propranolol ER (INDERAL LA) 80 MG 24 hr capsule, TAKE 1 CAPSULE (80 MG TOTAL) BY MOUTH DAILY., Disp: 90 capsule, Rfl: 4  Allergies  Allergen Reactions  . Azithromycin Itching  . Chocolate Hives  . Mobic [Meloxicam]     Emotional, crying.  Marland Kitchen Neomycin Hives  . Neosporin [Neomycin-Bacitracin Zn-Polymyx] Hives  . Strawberry Extract Hives  . Erythromycin Hives   Review of Systems Objective:  There were no vitals filed for this visit.  General: Well developed, nourished, in no acute distress, alert and oriented x3   Dermatological: Skin is warm, dry and supple bilateral. Nails x 10 are well maintained; remaining integument appears unremarkable at this time. There are no open sores, no preulcerative lesions, no rash or signs of infection present.  Vascular: Dorsalis Pedis artery and Posterior Tibial artery pedal pulses are 2/4 bilateral with immedate capillary fill time. Pedal hair growth present. No varicosities and no lower  extremity edema present bilateral.   Neruologic: Grossly intact via light touch bilateral. Vibratory intact via tuning fork bilateral. Protective threshold with Semmes Wienstein monofilament intact to all pedal sites bilateral. Patellar and Achilles deep tendon reflexes 2+ bilateral. No Babinski or clonus noted bilateral.   Musculoskeletal: No gross boney pedal deformities bilateral. No pain, crepitus, or limitation noted with foot and ankle range of motion bilateral. Muscular strength 5/5 in all groups tested bilateral.  Hallux malleus of the bilateral foot and limited plantar flexion to only rectus position at  the level of the metatarsophalangeal joint.  Gait: Unassisted, Nonantalgic.    Radiographs:  Radiographs taken today demonstrate a hallux malleus on lateral view.  There is a staple to the proximal phalanx of the hallux bilaterally where an Aiken osteotomy was performed.  There appears to have been a silver McBride bunion repair performed at some point as well as well as a hammertoe repair with an arthroplasty and probably a tendon transfer.  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  Plan: Discussed etiology pathology conservative versus surgical therapies at this point consented her today for removal of the staple from the proximal phalanx hallux left.  Also discussed hallux interphalangeal joint fusion with screw and release of the first metatarsophalangeal joint.  She understands this is amenable to it understands that she is will need to be have time off work she will be nonweightbearing for period time using crutches or knee scooter.  She also understands the risk involved which may include but not limited to postop pain bleeding swelling infection recurrence need for further surgery overcorrection under correction failure of the wounds to heal possible loss of digit limb or life.  I will follow-up with her in the near future for surgical intervention.     Ashley [name] T. [place], [place]

## 2020-09-04 ENCOUNTER — Ambulatory Visit: Payer: 59 | Admitting: Orthopaedic Surgery

## 2020-09-04 ENCOUNTER — Telehealth: Payer: Self-pay | Admitting: Urology

## 2020-09-04 ENCOUNTER — Other Ambulatory Visit: Payer: Self-pay | Admitting: Podiatry

## 2020-09-04 ENCOUNTER — Encounter: Payer: Self-pay | Admitting: Podiatry

## 2020-09-04 DIAGNOSIS — M2030 Hallux varus (acquired), unspecified foot: Secondary | ICD-10-CM

## 2020-09-04 NOTE — Telephone Encounter (Signed)
DOS - 09/20/20  REMOVAL OF FIXATION DEEP 1ST LEFT --- 20680 CAPSULOTOMY MPJ RELEASE 1ST LEFT --- 28270 HALLUX IPJ FUSION LEFT --- 25053  [facility] EFFECTIVE DATE - 11/23/19   SPOKE WITH RIZA N. WITH [employer] AND SHE STATED NO PRIOR AUTH IS REQUIRED FOR CPT CODES 97673, [id number] AND [id number]. REF # [id number] SPOKE WITH JOHN WITH [facility] AND HE STATED NO PRIOR AUTH IS REQUIRED FOR SPT CODES 90240, 28270 AND [phone], BUT DID STAT THAT THIS CASE IS SUBJECT TO REVIEW AND CLINICALS MAY HAVE TO BE SENT IF A CASE IS OPENED.  REF # [id number]

## 2020-09-10 ENCOUNTER — Encounter: Payer: Self-pay | Admitting: Podiatry

## 2020-09-11 ENCOUNTER — Encounter: Payer: Self-pay | Admitting: Podiatry

## 2020-09-12 ENCOUNTER — Encounter: Payer: Self-pay | Admitting: Podiatry

## 2020-09-13 ENCOUNTER — Ambulatory Visit: Payer: 59 | Admitting: Orthopaedic Surgery

## 2020-09-13 ENCOUNTER — Telehealth: Payer: Self-pay | Admitting: Podiatry

## 2020-09-13 VITALS — Ht 67.0 in | Wt 250.0 lb

## 2020-09-13 DIAGNOSIS — M5416 Radiculopathy, lumbar region: Secondary | ICD-10-CM | POA: Diagnosis not present

## 2020-09-13 NOTE — Telephone Encounter (Signed)
Pt received medrol dose pack for bug bites from her PCP, and would like to know if it's okay to take before her surgery. Please advise.

## 2020-09-13 NOTE — Progress Notes (Signed)
Office Visit Note   Patient: Ashley [name]           Date of Birth: [birth year]           MRN: [id number] Visit Date: 09/13/2020              Requested by: Rick Duff, PA-C No address on file PCP: Rick Duff, PA-C   Assessment & Plan: Visit Diagnoses: No diagnosis found.  Plan: We will defer spine lumbar MRI until after toe surgery.  We will make an appointment in 3 months.  She gets over toe surgery earlier would like to proceed with MRI she can call and notify us.  Follow-Up Instructions: Return in about 3 months (around 12/13/2020).   Orders:  No orders of the defined types were placed in this encounter.  No orders of the defined types were placed in this encounter.     Procedures: No procedures performed   Clinical Data: No additional findings.   Subjective: Chief Complaint  Patient presents with  . Left Leg - Pain, Follow-up    HPI 60 year old female returns with ongoing problems with back and right leg pain.  She had a CT scan of the abdomen for another problem which showed some right L5-S1 foraminal narrowing.  She has had gastric bypass procedure cannot take anti-inflammatories.  She states she has upcoming toe surgery for hammertoe which is coming up shortly.  Increased problems with prolonged standing or walking.  Review of Systems all of systems updated unchanged from 07/09/2020   Objective: Vital Signs: Ht 5\' 7"  (1.702 m)   Wt 250 lb (113.4 kg)   BMI 39.16 kg/m   Physical Exam Constitutional:      Appearance: She is well-developed.  HENT:     Head: Normocephalic.     Right Ear: External ear normal.     Left Ear: External ear normal.  Eyes:     Pupils: Pupils are equal, round, and reactive to light.  Neck:     Thyroid: No thyromegaly.     Trachea: No tracheal deviation.  Cardiovascular:     Rate and Rhythm: Normal rate.  Pulmonary:     Effort: Pulmonary effort is normal.  Abdominal:     Palpations: Abdomen is soft.   Skin:    General: Skin is warm and dry.  Neurological:     Mental Status: She is alert and oriented to person, place, and time.  Psychiatric:        Behavior: Behavior normal.     Ortho Exam patient is able to ambulate heel and toe walk.  Some pain with straight leg raising 70 degrees on the right negative on the left negative popliteal compression test.  Good internal and external rotation of both hips.  Good hip flexion strength. Specialty Comments:  No specialty comments available.  Imaging: No results found.   PMFS History: Patient Active Problem List   Diagnosis Date Noted  . Lumbar radicular pain 07/09/2020  . Ganglion cyst of finger 09/21/2019  . Edema 11/21/2018  . Tremor 11/21/2018  . Genital herpes simplex 11/17/2018  . Severe obesity (BMI 35.0-39.9) with comorbidity (HCC) 09/27/2018  . Primary osteoarthritis involving multiple joints 12/29/2017  . S/P gastric bypass 12/29/2017  . Gastroesophageal reflux disease without esophagitis 09/24/2017  . Non-seasonal allergic rhinitis 09/24/2017  . Acquired hallux interphalangeus 04/21/2017  . Pain in toe of left foot 04/21/2017  . Antibiotic-induced yeast infection 03/01/2017  . Irritable bowel syndrome with both constipation  and diarrhea 12/25/2016  . Vitamin D deficiency 12/25/2016  . Primary osteoarthritis of first carpometacarpal joint of right hand 06/15/2016  . Morbid obesity with BMI of 45.0-49.9, adult (HCC) 04/09/2015  . CTS (carpal tunnel syndrome) 07/02/2014  . Essential tremor 03/23/2013  . History of cholecystectomy 06/08/2011  . Neuropathy 06/08/2011   Past Medical History:  Diagnosis Date  . Essential tremor   . GERD (gastroesophageal reflux disease)   . History of cholecystectomy   . Hypertension   . Neuropathy   . Palpitations   . Seasonal allergies   . Sinus infection     Family History  Problem Relation Age of Onset  . Colon cancer Neg Hx   . Esophageal cancer Neg Hx   . Rectal cancer  Neg Hx   . Stomach cancer Neg Hx     Past Surgical History:  Procedure Laterality Date  . CARPAL TUNNEL RELEASE Right 2005   left 2011  . CESAREAN SECTION  1989  . CHOLECYSTECTOMY  2008  . CORRECTION OF HAMMER TOE REPAIR AND BUNION CORRECTION  2018   both feet  . Hammer Toe Repair Bilateral 09/11/2016   #2 Toe Bilateral   . HEEL SPUR SURGERY Left   . history of neuroplasty decompression median nerve at carpal tunnel    . LAPAROSCOPIC GASTRIC SLEEVE RESECTION WITH HIATAL HERNIA REPAIR  04/09/2015   Procedure: LAPAROSCOPIC GASTRIC SLEEVE RESECTION WITH HIATAL HERNIA REPAIR;  Surgeon: Glenna Fellows, MD;  Location: [facility];  Service: General;;  . thumb joint replacement Bilateral    R- 03/02/17 & L- 07/21/16  . UPPER GI ENDOSCOPY  04/09/2015   Procedure: UPPER GI ENDOSCOPY;  Surgeon: Glenna Fellows, MD;  Location: [facility];  Service: General;;   Social History   Occupational History  . Occupation: Not currently working post-op    Employer: [employer]  Tobacco Use  . Smoking status: Never Smoker  . Smokeless tobacco: Never Used  Vaping Use  . Vaping Use: Never used  Substance and Sexual Activity  . Alcohol use: No  . Drug use: No  . Sexual activity: Not on file

## 2020-09-18 ENCOUNTER — Encounter: Payer: Self-pay | Admitting: Podiatry

## 2020-09-18 ENCOUNTER — Other Ambulatory Visit: Payer: Self-pay | Admitting: Podiatry

## 2020-09-18 MED ORDER — ONDANSETRON HCL 4 MG PO TABS: 4.0000 mg | ORAL_TABLET | Freq: Three times a day (TID) | ORAL | 0 refills | Status: DC | PRN

## 2020-09-18 MED ORDER — CEPHALEXIN 500 MG PO CAPS: 500.0000 mg | ORAL_CAPSULE | Freq: Three times a day (TID) | ORAL | 0 refills | Status: DC

## 2020-09-18 MED ORDER — OXYCODONE-ACETAMINOPHEN 10-325 MG PO TABS: 1.0000 | ORAL_TABLET | Freq: Three times a day (TID) | ORAL | 0 refills | Status: AC | PRN

## 2020-09-20 DIAGNOSIS — M2032 Hallux varus (acquired), left foot: Secondary | ICD-10-CM

## 2020-09-23 ENCOUNTER — Telehealth: Payer: Self-pay | Admitting: *Deleted

## 2020-09-23 ENCOUNTER — Other Ambulatory Visit: Payer: Self-pay | Admitting: Sports Medicine

## 2020-09-23 MED ORDER — GABAPENTIN 300 MG PO CAPS: 300.0000 mg | ORAL_CAPSULE | Freq: Every day | ORAL | 0 refills | Status: DC

## 2020-09-23 MED ORDER — GABAPENTIN 300 MG PO CAPS
300.0000 mg | ORAL_CAPSULE | Freq: Every day | ORAL | 0 refills | Status: DC
Start: 2020-09-23 — End: 2020-09-23

## 2020-09-23 NOTE — Telephone Encounter (Signed)
This sounds like nerve pain which can happen after having foot surgery. Have her to check the dressing to make sure it is not too tight. She may loosen the ace wrap but do not take it off completely. I have also sent nerve medication, Gabapentin for her to take one tablet at bedtime to see if this will give her some additional relief. Thanks Dr. Kathie Rhodes

## 2020-09-23 NOTE — Telephone Encounter (Signed)
Called patient and informed per Dr Wynema Birch message,verbalized understanding her recommendations but would like  prescription  sent to [facility]. Changed and resent per patient's request.

## 2020-09-23 NOTE — Telephone Encounter (Signed)
Patient is having problems sleeping when lying flat in bed because stabbing foot pain(surgery 09/20/20. She has been elevating,icing, taking pain meds 8hr.window as instructed. Please advise.

## 2020-09-23 NOTE — Progress Notes (Signed)
Sent Gabapentin for sharp stabbing pain the foot with lying down s/p foot surgery -Dr. Marylene Land

## 2020-09-26 ENCOUNTER — Other Ambulatory Visit: Payer: Self-pay

## 2020-09-26 ENCOUNTER — Encounter: Payer: Self-pay | Admitting: Podiatry

## 2020-09-26 ENCOUNTER — Ambulatory Visit (INDEPENDENT_AMBULATORY_CARE_PROVIDER_SITE_OTHER): Payer: 59

## 2020-09-26 ENCOUNTER — Ambulatory Visit (INDEPENDENT_AMBULATORY_CARE_PROVIDER_SITE_OTHER): Payer: 59 | Admitting: Podiatry

## 2020-09-26 DIAGNOSIS — M2032 Hallux varus (acquired), left foot: Secondary | ICD-10-CM

## 2020-09-26 DIAGNOSIS — M205X2 Other deformities of toe(s) (acquired), left foot: Secondary | ICD-10-CM

## 2020-09-26 DIAGNOSIS — Z9889 Other specified postprocedural states: Secondary | ICD-10-CM

## 2020-09-26 DIAGNOSIS — T8460XA Infection and inflammatory reaction due to internal fixation device of unspecified site, initial encounter: Secondary | ICD-10-CM

## 2020-09-26 NOTE — Progress Notes (Signed)
She presents today for her first postop visit date of surgery 09/20/2020 hallux IPJ fusion left foot.  With wire.  She states that some of the pain that it has is like the pain before the surgery.  She goes on to tell Morrie Sheldon that she has been putting partial weight down on her foot.  She states that she has been up on her knee scooter getting around.  Without any complications.  States that she will be going back to work with her scooter.  Objective: Vital signs are stable she is alert and oriented x3.  Pulses are palpable.  Vascular dressing intact was removed demonstrates K wires in place to the end of the hallux left but does demonstrate edematous and erythematous no streaking no cellulitis but appears to be very inflamed the skin is very tight and a portion of the skin at the T intersection of the incision appears to be dying due to can stricture.  There is no purulence no malodor but there is some some serosanguineous drainage.  Radiographs taken today demonstrate nice fusion in good position K wires intact as well as a screw with good compression.  Assessment: First postop visit.  Plan: I redressed the foot today dressed a compressive dressing encouraged her to keep this elevated and to stay off of it.  She is not to ambulate on this at all and she is to keep it dry and clean and not to get it wet.  I will follow-up with her in 1 to 2 weeks to make sure she is healing well.

## 2020-09-30 ENCOUNTER — Encounter: Payer: Self-pay | Admitting: Podiatry

## 2020-09-30 MED ORDER — OXYCODONE-ACETAMINOPHEN 10-325 MG PO TABS: 1.0000 | ORAL_TABLET | Freq: Three times a day (TID) | ORAL | 0 refills | Status: AC | PRN

## 2020-10-02 NOTE — Telephone Encounter (Signed)
Error message

## 2020-10-03 ENCOUNTER — Ambulatory Visit (INDEPENDENT_AMBULATORY_CARE_PROVIDER_SITE_OTHER): Payer: 59 | Admitting: Podiatry

## 2020-10-03 ENCOUNTER — Other Ambulatory Visit: Payer: Self-pay | Admitting: Podiatry

## 2020-10-03 ENCOUNTER — Encounter: Payer: Self-pay | Admitting: Podiatry

## 2020-10-03 ENCOUNTER — Other Ambulatory Visit: Payer: Self-pay

## 2020-10-03 DIAGNOSIS — M2032 Hallux varus (acquired), left foot: Secondary | ICD-10-CM

## 2020-10-03 DIAGNOSIS — T8460XA Infection and inflammatory reaction due to internal fixation device of unspecified site, initial encounter: Secondary | ICD-10-CM

## 2020-10-03 DIAGNOSIS — Z9889 Other specified postprocedural states: Secondary | ICD-10-CM

## 2020-10-03 DIAGNOSIS — M205X2 Other deformities of toe(s) (acquired), left foot: Secondary | ICD-10-CM

## 2020-10-03 MED ORDER — DOXYCYCLINE HYCLATE 100 MG PO TABS: 100.0000 mg | ORAL_TABLET | Freq: Two times a day (BID) | ORAL | 0 refills | Status: DC

## 2020-10-04 ENCOUNTER — Encounter: Payer: Self-pay | Admitting: Podiatry

## 2020-10-04 NOTE — Progress Notes (Signed)
Subjective:  Patient ID: Ashley [name], female    DOB: 1960/07/02,  MRN: [id number]  Chief Complaint  Patient presents with  . Routine Post Op    PT stated that she has some pain around her big toe and a tingling sensation      60 y.o. female returns for post-op check.  Patient states that she is doing well.  She has some pain around the big toe and some tingling sensation as part of normal postop healing.  She would like to have the stitches already come out today.  She denies any other acute complaints.  She has been nonweightbearing to the left foot  Review of Systems: Negative except as noted in the HPI. Denies N/V/F/Ch.  Past Medical History:  Diagnosis Date  . Essential tremor   . GERD (gastroesophageal reflux disease)   . History of cholecystectomy   . Hypertension   . Neuropathy   . Palpitations   . Seasonal allergies   . Sinus infection     Current Outpatient Medications:  .  CALCIUM CITRATE PO, Take 1 tablet by mouth 2 (two) times daily., Disp: , Rfl:  .  cephALEXin (KEFLEX) 500 MG capsule, Take 1 capsule (500 mg total) by mouth 3 (three) times daily., Disp: 30 capsule, Rfl: 0 .  Cyanocobalamin 500 MCG SUBL, Place 500 mcg under the tongue daily., Disp: , Rfl:  .  dicyclomine (BENTYL) 10 MG capsule, Take 10 mg by mouth 4 (four) times daily as needed for spasms., Disp: , Rfl:  .  doxycycline (VIBRA-TABS) 100 MG tablet, Take 1 tablet (100 mg total) by mouth 2 (two) times daily., Disp: 20 tablet, Rfl: 0 .  flintstones complete (FLINTSTONES) 60 MG chewable tablet, Chew 1 tablet by mouth daily., Disp: , Rfl:  .  fluticasone (FLONASE) 50 MCG/ACT nasal spray, Place 1 spray into the nose 2 (two) times daily. , Disp: , Rfl:  .  gabapentin (NEURONTIN) 300 MG capsule, Take 1 capsule (300 mg total) by mouth at bedtime., Disp: 30 capsule, Rfl: 0 .  levocetirizine (XYZAL) 5 MG tablet, Take 5 mg by mouth daily., Disp: , Rfl:  .  montelukast (SINGULAIR) 10 MG tablet, Take 10 mg by  mouth at bedtime., Disp: , Rfl:  .  ondansetron (ZOFRAN) 4 MG tablet, Take 1 tablet (4 mg total) by mouth every 8 (eight) hours as needed for nausea or vomiting., Disp: 20 tablet, Rfl: 0 .  oxyCODONE-acetaminophen (PERCOCET) 10-325 MG tablet, Take 1 tablet by mouth every 8 (eight) hours as needed for up to 7 days for pain., Disp: 21 tablet, Rfl: 0 .  propranolol ER (INDERAL LA) 80 MG 24 hr capsule, TAKE 1 CAPSULE (80 MG TOTAL) BY MOUTH DAILY., Disp: 90 capsule, Rfl: 4  Social History   Tobacco Use  Smoking Status Never Smoker  Smokeless Tobacco Never Used    Allergies  Allergen Reactions  . Azithromycin Itching  . Chocolate Hives  . Mobic [Meloxicam]     Emotional, crying.  Marland Kitchen Neomycin Hives  . Neosporin [Neomycin-Bacitracin Zn-Polymyx] Hives  . Strawberry Extract Hives  . Erythromycin Hives   Objective:  There were no vitals filed for this visit. There is no height or weight on file to calculate BMI. Constitutional Well developed. Well nourished.  Vascular Foot warm and well perfused. Capillary refill normal to all digits.   Neurologic Normal speech. Oriented to person, place, and time. Epicritic sensation to light touch grossly present bilaterally.  Dermatologic Skin healing well without signs  of infection. Skin edges well coapted without signs of infection.  Orthopedic: Tenderness to palpation noted about the surgical site.   Radiographs: None Assessment:   1. Mallet toe of left foot   2. Hallux malleus of left foot   3. Status post left foot surgery   4. Internal fixation device (pin, rod, or screw) infection-inflammation (HCC)    Plan:  Patient was evaluated and treated and all questions answered.  S/p foot surgery left -Progressing as expected post-operatively. -XR: See above -WB Status: Nonweightbearing in left lower extremity -Sutures: Intact.  The stitches are not quite ready to come out yet.  No clinical signs of dehiscence noted no infection  noted -Medications: None -Foot redressed.  No follow-ups on file.

## 2020-10-10 ENCOUNTER — Ambulatory Visit (INDEPENDENT_AMBULATORY_CARE_PROVIDER_SITE_OTHER): Payer: 59 | Admitting: Podiatry

## 2020-10-10 ENCOUNTER — Encounter: Payer: Self-pay | Admitting: Podiatry

## 2020-10-10 ENCOUNTER — Ambulatory Visit (INDEPENDENT_AMBULATORY_CARE_PROVIDER_SITE_OTHER): Payer: 59

## 2020-10-10 ENCOUNTER — Other Ambulatory Visit: Payer: Self-pay

## 2020-10-10 DIAGNOSIS — M205X2 Other deformities of toe(s) (acquired), left foot: Secondary | ICD-10-CM

## 2020-10-10 DIAGNOSIS — M2032 Hallux varus (acquired), left foot: Secondary | ICD-10-CM

## 2020-10-10 DIAGNOSIS — Z9889 Other specified postprocedural states: Secondary | ICD-10-CM

## 2020-10-13 NOTE — Progress Notes (Signed)
She presents today for follow-up of her hallux interphalangeal joint fusion date of surgery 09/20/2020.  She denies fever chills nausea vomit muscle aches pains calf pain back pain chest pain shortness of breath.  States that she has considerable pain about the plantar aspect of the first metatarsophalangeal joint of the surgical foot.  Dry sterile dressing intact was removed demonstrates sutures appear to be mostly intact and well coapted I did remove some of the sutures today and will leave some of the remaining sutures in until next week.  The wound appears to be doing well does not appear to be clinically infected at this point time however radiographs taken today do demonstrate a band of the K wire at the level of the metatarsophalangeal joint due to excessive ambulation at this point I do not see a fracture of the K wire.  Assessment well-healing surgical foot bent K wire of status post hallux interphalangeal joint fusion.  Plan: Redressed today dressed compressive dressing after removing a portion of the sutures.  Follow-up with her in 1 week for removal of the remaining stitches.

## 2020-10-17 ENCOUNTER — Encounter: Payer: 59 | Admitting: Podiatry

## 2020-10-24 ENCOUNTER — Encounter: Payer: Self-pay | Admitting: Podiatry

## 2020-10-24 ENCOUNTER — Other Ambulatory Visit: Payer: Self-pay

## 2020-10-24 ENCOUNTER — Ambulatory Visit (INDEPENDENT_AMBULATORY_CARE_PROVIDER_SITE_OTHER): Payer: 59 | Admitting: Podiatry

## 2020-10-24 ENCOUNTER — Ambulatory Visit (INDEPENDENT_AMBULATORY_CARE_PROVIDER_SITE_OTHER): Payer: 59

## 2020-10-24 DIAGNOSIS — T8460XA Infection and inflammatory reaction due to internal fixation device of unspecified site, initial encounter: Secondary | ICD-10-CM

## 2020-10-24 DIAGNOSIS — Z9889 Other specified postprocedural states: Secondary | ICD-10-CM

## 2020-10-24 DIAGNOSIS — M205X2 Other deformities of toe(s) (acquired), left foot: Secondary | ICD-10-CM | POA: Diagnosis not present

## 2020-10-24 DIAGNOSIS — M2032 Hallux varus (acquired), left foot: Secondary | ICD-10-CM

## 2020-10-25 ENCOUNTER — Telehealth: Payer: Self-pay | Admitting: Podiatry

## 2020-10-25 ENCOUNTER — Encounter: Payer: Self-pay | Admitting: Podiatry

## 2020-10-25 NOTE — Telephone Encounter (Signed)
Called patient lvm to reschedule 6/9 appt also sent reschedule letter

## 2020-10-26 NOTE — Progress Notes (Signed)
She presents today for follow-up of her hallux interphalangeal joint fusion.  She says it feels all right but I did bump it as she refers to the pin on the car door the other day.  Objective: Vital signs are stable alert and oriented x3.  Foot is mildly edematous toe is mildly edematous incision appears to be healing very nicely there is still scab present.  All the sutures were removed.  Radiographs demonstrate healing but not yet healed fusion site.  The wire is bent is holding the toe in good position.  Assessment well-healing surgical foot.  Plan: I will have her back in a week or so at which time we will perform a Mayo block bend the toe down to bend the pin more straight and then try to pull a pin through the screw.  I will follow-up with her at that time.

## 2020-10-29 ENCOUNTER — Ambulatory Visit (INDEPENDENT_AMBULATORY_CARE_PROVIDER_SITE_OTHER): Payer: 59 | Admitting: Podiatry

## 2020-10-29 ENCOUNTER — Encounter: Payer: Self-pay | Admitting: Podiatry

## 2020-10-29 ENCOUNTER — Other Ambulatory Visit: Payer: Self-pay

## 2020-10-29 DIAGNOSIS — T8460XA Infection and inflammatory reaction due to internal fixation device of unspecified site, initial encounter: Secondary | ICD-10-CM

## 2020-10-29 DIAGNOSIS — Z9889 Other specified postprocedural states: Secondary | ICD-10-CM

## 2020-10-29 DIAGNOSIS — M205X2 Other deformities of toe(s) (acquired), left foot: Secondary | ICD-10-CM

## 2020-10-29 DIAGNOSIS — M2032 Hallux varus (acquired), left foot: Secondary | ICD-10-CM

## 2020-10-30 ENCOUNTER — Encounter: Payer: Self-pay | Admitting: Podiatry

## 2020-10-30 NOTE — Progress Notes (Signed)
She presents today date of surgery 09/20/2020 left foot with a hallux interphalangeal joint refusion and a release of the first metatarsal phalangeal joint and pinning.  States that it hurts a little on the side of my toe but not bad.  I want this pin out today because about to go to the beach.  Objective: Vital signs are stable alert oriented x3.  She still has some mild to moderate edema and scab along the incision sites.  The wire is rectus in the toe based on rate previous radiographs does have a bend at the level of the metatarsal phalangeal joint.  And the pin is not able to turn very easily when I try twisting the end of it.  Performing Mayo block with 8 cc of a 2% lidocaine plain I was able to plantar flex the first metatarsal phalangeal joint to bend the pin so that it would be more easily rotatable.  I was able to then hold the toe in position and removed remove the pin in total.  Assessment hallux interphalangeal joint fusion.  Plan: Removal of the K wire today after local anesthetic was administered.  She tolerated procedure well unable to follow-up with her in a couple of weeks for another set of x-rays.  Betadine was placed and a dry sterile compressive dressing was placed which she should be able to remove later today or tomorrow start washing the foot thoroughly and rehydrating it.

## 2020-10-31 ENCOUNTER — Encounter: Payer: 59 | Admitting: Podiatry

## 2020-11-14 ENCOUNTER — Ambulatory Visit (INDEPENDENT_AMBULATORY_CARE_PROVIDER_SITE_OTHER): Payer: 59

## 2020-11-14 ENCOUNTER — Ambulatory Visit (INDEPENDENT_AMBULATORY_CARE_PROVIDER_SITE_OTHER): Payer: 59 | Admitting: Podiatry

## 2020-11-14 ENCOUNTER — Other Ambulatory Visit: Payer: Self-pay

## 2020-11-14 DIAGNOSIS — Z9889 Other specified postprocedural states: Secondary | ICD-10-CM

## 2020-11-14 DIAGNOSIS — M205X2 Other deformities of toe(s) (acquired), left foot: Secondary | ICD-10-CM | POA: Diagnosis not present

## 2020-11-14 DIAGNOSIS — T8460XA Infection and inflammatory reaction due to internal fixation device of unspecified site, initial encounter: Secondary | ICD-10-CM

## 2020-11-14 DIAGNOSIS — M2032 Hallux varus (acquired), left foot: Secondary | ICD-10-CM

## 2020-11-15 ENCOUNTER — Encounter: Payer: Self-pay | Admitting: Podiatry

## 2020-11-17 NOTE — Progress Notes (Signed)
She presents today postop visit #5 for follow-up of her hallux interphalangeal joint fusion and release of the first metatarsal phalangeal joint left foot.  States that she still experiences some sharp shooting pain and the joint is stiff.  Objective: Vital signs are stable she alert and oriented x3.  Pulses are palpable.  There is some mild erythema some mild edema a little serosanguineous drainage at the distal aspect of the incision site.  The K wire site has gone on to heal uneventfully.  She has good range of motion of the first metatarsophalangeal joint I think it would benefit from physical therapy.  Assessment well-healing surgical foot good alignment of the hallux interphalangeal joint.  Plan: I am going to follow-up with her in 1 month for an x-ray dispensed a Darco shoe today.  And I am going to send her to physical therapy at [facility]

## 2020-11-18 ENCOUNTER — Encounter: Payer: Self-pay | Admitting: Podiatry

## 2020-11-18 ENCOUNTER — Other Ambulatory Visit: Payer: Self-pay

## 2020-11-18 ENCOUNTER — Ambulatory Visit (HOSPITAL_COMMUNITY): Payer: 59 | Attending: Podiatry | Admitting: Physical Therapy

## 2020-11-18 ENCOUNTER — Encounter (HOSPITAL_COMMUNITY): Payer: Self-pay | Admitting: Physical Therapy

## 2020-11-18 DIAGNOSIS — R29898 Other symptoms and signs involving the musculoskeletal system: Secondary | ICD-10-CM | POA: Diagnosis present

## 2020-11-18 DIAGNOSIS — R2689 Other abnormalities of gait and mobility: Secondary | ICD-10-CM | POA: Diagnosis present

## 2020-11-18 NOTE — Therapy (Signed)
[facility] [address] [place], [place], [address] Phone: [phone]   Fax:  [phone]  Physical Therapy Evaluation  Patient Details  Name: Ashley [name] MRN: [id number] Date of Birth: 21-Jun-1960 Referring Provider (PT): Ernestene Kiel DPM   Encounter Date: 11/18/2020   PT End of Session - 11/18/20 0947     Visit Number 1    Number of Visits 1    Date for PT Re-Evaluation 11/18/20    Authorization Type [employer] (no VL, no auth)    PT Start Time 0915    PT Stop Time 0945    PT Time Calculation (min) 30 min    Activity Tolerance Patient tolerated treatment well    Behavior During Therapy [facility] for tasks assessed/performed             Past Medical History:  Diagnosis Date   Essential tremor    GERD (gastroesophageal reflux disease)    History of cholecystectomy    Hypertension    Neuropathy    Palpitations    Seasonal allergies    Sinus infection     Past Surgical History:  Procedure Laterality Date   CARPAL TUNNEL RELEASE Right 2005   left 2011   CESAREAN SECTION  1989   CHOLECYSTECTOMY  2008   CORRECTION OF HAMMER TOE REPAIR AND BUNION CORRECTION  2018   both feet   Hammer Toe Repair Bilateral 09/11/2016   #2 Toe Bilateral    HEEL SPUR SURGERY Left    history of neuroplasty decompression median nerve at carpal tunnel     LAPAROSCOPIC GASTRIC SLEEVE RESECTION WITH HIATAL HERNIA REPAIR  04/09/2015   Procedure: LAPAROSCOPIC GASTRIC SLEEVE RESECTION WITH HIATAL HERNIA REPAIR;  Surgeon: Glenna Fellows, MD;  Location: [facility];  Service: General;;   thumb joint replacement Bilateral    R- 03/02/17 & L- 07/21/16   UPPER GI ENDOSCOPY  04/09/2015   Procedure: UPPER GI ENDOSCOPY;  Surgeon: Glenna Fellows, MD;  Location: [facility];  Service: General;;    There were no vitals filed for this visit.    Subjective Assessment - 11/18/20 0917     Subjective Patient is a 60 y.o. female who presents to physical therapy  s/p L foot surgery for a mallet toe on April 29. She is having mostly stiffness still. She is having to wear a darco shoe. She hasn't been able to walk much. Her main goal is to get her toe more bendable. She is having swelling still. She did alot of walking recently and that made her foot hurt.    Patient Stated Goals to get her toe more bendable    Currently in Pain? No/denies                [facility] PT Assessment - 11/18/20 0001       Assessment   Medical Diagnosis s/p L foot surgery for mallet toe    Referring Provider (PT) Max Hyatt DPM    Onset Date/Surgical Date 09/20/20    Next MD Visit July      Precautions   Precautions None      Restrictions   Weight Bearing Restrictions No      Balance Screen   Has the patient fallen in the past 6 months No    Has the patient had a decrease in activity level because of a fear of falling?  No    Is the patient reluctant to leave their home because of a fear of  falling?  No      Prior Function   Level of Independence Independent    Vocation Full time employment    Leisure Walk      Cognition   Overall Cognitive Status Within Functional Limits for tasks assessed      Observation/Other Assessments   Observations Darco shoe on L foot, ambulates without AD      ROM / Strength   AROM / PROM / Strength Strength;AROM      AROM   Overall AROM Comments decreased throughout L foot/ toes; minimal AROM, PROM: 2 degrees flexion/ 5 degrees extension great toe      Strength   Strength Assessment Site Hip;Knee;Ankle    Right/Left Hip Right;Left    Right Hip Flexion 5/5    Left Hip Flexion 5/5    Right/Left Knee Right;Left    Right Knee Flexion 5/5    Right Knee Extension 5/5    Left Knee Flexion 5/5    Left Knee Extension 5/5    Right/Left Ankle Right;Left    Right Ankle Dorsiflexion 5/5    Left Ankle Dorsiflexion 5/5      Palpation   Palpation comment no tenderness in L foot, bilateral LE edema      Ambulation/Gait   Gait  Comments antalgic likely due to darco shoe                        Objective measurements completed on examination: See above findings.       [facility] Adult PT Treatment/Exercise - 11/18/20 0001       Exercises   Exercises Ankle      Ankle Exercises: Stretches   Other Stretch great toe flexion and extension 5x 10 second holds each      Ankle Exercises: Seated   Other Seated Ankle Exercises AROM flexion/extension x 10    Other Seated Ankle Exercises heel raises x 10                    PT Education - 11/18/20 0917     Education Details Patient educaed on exam findings, POC, Scope of PT, HEP, compression garments for bilateral LE edema    Person(s) Educated Patient    Methods Explanation;Demonstration;Handout    Comprehension Verbalized understanding;Returned demonstration              PT Short Term Goals - 11/18/20 0952       PT SHORT TERM GOAL #1   Title Patient will be educated on exam findings, POC, scope of PT, HEP    Time 1    Period Days    Status Achieved    Target Date 11/18/20                       Plan - 11/18/20 0948     Clinical Impression Statement Patient is a 60 y.o. female who presents to physical therapy s/p L foot surgery for a mallet toe on April 29. She presents with deficits in L great toe ROM, endurance, gait, balance and functional mobility with ADL. She is having to modify and restrict ADL as indicated by subjective information and objective measures which is affecting overall participation. Patient given HEP for AROM/PROM of L foot and she is able to perform with good mechanics. Patient educated on following up and getting new PT referral if needed. Patient does not require additional PT services at this time.    Personal  Factors and Comorbidities Fitness;Time since onset of injury/illness/exacerbation;Past/Current Experience;Comorbidity 2    Comorbidities obesity, hx of toe issues    Examination-Activity  Limitations Locomotion Level;Stand;Stairs    Examination-Participation Restrictions Occupation;Cleaning;Volunteer;Shop;Yard Work;Community Activity    Stability/Clinical Decision Making Stable/Uncomplicated    Clinical Decision Making Low    Rehab Potential Good    PT Frequency One time visit    PT Treatment/Interventions Gait training;Stair training;Functional mobility training;Therapeutic activities;Therapeutic exercise;Balance training;Neuromuscular re-education;DME Instruction;Patient/family education;ADLs/Self Care Home Management    PT Next Visit Plan n/a    PT Home Exercise Plan great toe PROM with stretch, AROM, heel raises    Consulted and Agree with Plan of Care Patient             Patient will benefit from skilled therapeutic intervention in order to improve the following deficits and impairments:  Abnormal gait, Difficulty walking, Decreased balance, Decreased mobility, Decreased strength, Hypomobility, Improper body mechanics, Decreased activity tolerance  Visit Diagnosis: Other abnormalities of gait and mobility  Other symptoms and signs involving the musculoskeletal system     Problem List Patient Active Problem List   Diagnosis Date Noted   Lumbar radicular pain 07/09/2020   Ganglion cyst of finger 09/21/2019   Edema 11/21/2018   Tremor 11/21/2018   Genital herpes simplex 11/17/2018   Severe obesity (BMI 35.0-39.9) with comorbidity (HCC) 09/27/2018   Primary osteoarthritis involving multiple joints 12/29/2017   S/P gastric bypass 12/29/2017   Gastroesophageal reflux disease without esophagitis 09/24/2017   Non-seasonal allergic rhinitis 09/24/2017   Acquired hallux interphalangeus 04/21/2017   Pain in toe of left foot 04/21/2017   Antibiotic-induced yeast infection 03/01/2017   Irritable bowel syndrome with both constipation and diarrhea 12/25/2016   Vitamin D deficiency 12/25/2016   Primary osteoarthritis of first carpometacarpal joint of right hand  06/15/2016   Morbid obesity with BMI of 45.0-49.9, adult (HCC) 04/09/2015   CTS (carpal tunnel syndrome) 07/02/2014   Essential tremor 03/23/2013   History of cholecystectomy 06/08/2011   Neuropathy 06/08/2011    9:54 AM, 11/18/20 Wyman Songster PT, DPT Physical Therapist at [facility]    [facility] [address] [place], [place], [address] Phone: [phone]   Fax:  [phone]  Name: Ashley [name] MRN: [id number] Date of Birth: [birth year]

## 2020-11-18 NOTE — Patient Instructions (Signed)
Access Code: [email or web address] URL: [email or web address] Date: 11/18/2020 Prepared by: Greig Castilla Kataryna Mcquilkin  Exercises Seated Toe Flexion Extension PROM - 3 x daily - 7 x weekly - 5 reps - 10 second hold Seated Toe Curl - 3 x daily - 7 x weekly - 3 sets - 10 reps Seated Heel Raise - 3 x daily - 7 x weekly - 3 sets - 10 reps

## 2020-12-13 ENCOUNTER — Ambulatory Visit: Payer: 59 | Admitting: Orthopaedic Surgery

## 2020-12-17 ENCOUNTER — Ambulatory Visit: Payer: 59 | Admitting: Orthopaedic Surgery

## 2020-12-17 ENCOUNTER — Ambulatory Visit (INDEPENDENT_AMBULATORY_CARE_PROVIDER_SITE_OTHER): Payer: 59

## 2020-12-17 ENCOUNTER — Other Ambulatory Visit: Payer: Self-pay

## 2020-12-17 ENCOUNTER — Encounter: Payer: Self-pay | Admitting: Podiatry

## 2020-12-17 ENCOUNTER — Ambulatory Visit (INDEPENDENT_AMBULATORY_CARE_PROVIDER_SITE_OTHER): Payer: 59 | Admitting: Podiatry

## 2020-12-17 DIAGNOSIS — M205X2 Other deformities of toe(s) (acquired), left foot: Secondary | ICD-10-CM

## 2020-12-17 DIAGNOSIS — M2032 Hallux varus (acquired), left foot: Secondary | ICD-10-CM

## 2020-12-17 DIAGNOSIS — T8460XA Infection and inflammatory reaction due to internal fixation device of unspecified site, initial encounter: Secondary | ICD-10-CM

## 2020-12-17 DIAGNOSIS — Z9889 Other specified postprocedural states: Secondary | ICD-10-CM

## 2020-12-17 NOTE — Progress Notes (Signed)
She presents today for follow-up of her hallux IPJ fusion with release of the first metatarsophalangeal joint she states that is feeling better she states that she can get her sneakers to feel comfortable and had to purchase a larger pair.  She states that she wears slides at home and that feels better to her foot.  She states that she is having more problems with her right foot and like to go ahead and consider having it surgically repaired as well.  Objective: Vital signs are stable she is alert and oriented x3 I reviewed her past medical history medications allergies surgeries social history.  She has good correction of her left hallux and radiographically appears to be fusing very nicely the interphalangeal joint and internal fixation is intact and does not appear to be moving.  Contralateral foot does demonstrate hallux malleus with contracted first metatarsophalangeal joint from previous surgeries.  Radiographic evaluation does demonstrate a staple to the proximal phalanx.  She also has flexible hammertoe deformities to toes #4 and #3 of the right foot.  Assessment: Well-healing surgical foot left.  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  Hammertoe deformity #3 and #4 of the right foot.  Plan: Discussed etiology pathology and surgical therapies today we consented her for the same surgery to her right foot that she had on her left consisting of a hallux interphalangeal joint fusion with screw as well as a release of the first metatarsophalangeal joint.  Were also going to attempt flexor tenotomy's to help straighten hammertoes #3 #4 of the right foot.

## 2020-12-19 ENCOUNTER — Encounter: Payer: Self-pay | Admitting: Podiatry

## 2020-12-20 ENCOUNTER — Encounter: Payer: Self-pay | Admitting: Podiatry

## 2020-12-24 ENCOUNTER — Encounter: Payer: Self-pay | Admitting: Orthopaedic Surgery

## 2020-12-24 ENCOUNTER — Other Ambulatory Visit: Payer: Self-pay

## 2020-12-24 ENCOUNTER — Ambulatory Visit (INDEPENDENT_AMBULATORY_CARE_PROVIDER_SITE_OTHER): Payer: 59 | Admitting: Orthopaedic Surgery

## 2020-12-24 VITALS — BP 132/76 | HR 69 | Ht 67.0 in | Wt 265.0 lb

## 2020-12-24 DIAGNOSIS — M5416 Radiculopathy, lumbar region: Secondary | ICD-10-CM | POA: Diagnosis not present

## 2020-12-24 NOTE — Progress Notes (Signed)
Office Visit Note   Patient: Ashley [name]           Date of Birth: [birth year]           MRN: [id number] Visit Date: 12/24/2020              Requested by: Rick Duff, PA-C No address on file PCP: April Manson, NP   Assessment & Plan: Visit Diagnoses: No diagnosis found.  Plan: Greater than 19-month history of persistent back and right leg pain.  Patient cannot take anti-inflammatories due to gastric bypass procedure.  She has been on muscle relaxants been through physical therapy without relief.  CT done for the abdomen showed foraminal stenosis L5-S1 on the right.  Patient states her symptoms are getting better would like to proceed with MRI scan.  Office follow-up after scan for review.  Follow-Up Instructions: No follow-ups on file.   Orders:  No orders of the defined types were placed in this encounter.  No orders of the defined types were placed in this encounter.     Procedures: No procedures performed   Clinical Data: No additional findings.   Subjective: Chief Complaint  Patient presents with   Right Leg - Pain, Follow-up    60 year old female returns with ongoing problems with back and right leg pain.  Previous CT of the abdomen for another problem showed right foraminal narrowing L5-S1.  She had gastric bypass surgery and recent hammertoe surgery.  She is getting ready to have the other toe taken care of and states the back continues to bother her with pain that shoots into the buttocks down toward the leg with numbness and tingling.  She has not fallen.  No associated bowel bladder symptoms no fever or chills.  Review of Systems All other systems update unchanged from 07/09/2020 office visit.  Objective: Vital Signs: BP 132/76   Pulse 69   Ht 5\' 7"  (1.702 m)   Wt 265 lb (120.2 kg)   BMI 41.50 kg/m   Physical Exam Constitutional:      Appearance: She is well-developed.  HENT:     Head: Normocephalic.     Right Ear: External ear normal.      Left Ear: External ear normal. There is no impacted cerumen.  Eyes:     Pupils: Pupils are equal, round, and reactive to light.  Neck:     Thyroid: No thyromegaly.     Trachea: No tracheal deviation.  Cardiovascular:     Rate and Rhythm: Normal rate.  Pulmonary:     Effort: Pulmonary effort is normal.  Abdominal:     Palpations: Abdomen is soft.  Musculoskeletal:     Cervical back: No rigidity.  Skin:    General: Skin is warm and dry.  Neurological:     Mental Status: She is alert and oriented to person, place, and time.  Psychiatric:        Behavior: Behavior normal.    Ortho Exam patient is ambulatory.  She has pain with thigh palpation on the right positive straight leg raising 70 degrees.  Knee and ankle jerk are intact distal pulses are intact.  No quad weakness negative logroll hips.  Knees reach full extension.  Specialty Comments:  No specialty comments available.  Imaging: No results found.   PMFS History: Patient Active Problem List   Diagnosis Date Noted   Lumbar radicular pain 07/09/2020   Ganglion cyst of finger 09/21/2019   Edema 11/21/2018   Tremor 11/21/2018  Genital herpes simplex 11/17/2018   Severe obesity (BMI 35.0-39.9) with comorbidity (HCC) 09/27/2018   Primary osteoarthritis involving multiple joints 12/29/2017   S/P gastric bypass 12/29/2017   Gastroesophageal reflux disease without esophagitis 09/24/2017   Non-seasonal allergic rhinitis 09/24/2017   Acquired hallux interphalangeus 04/21/2017   Pain in toe of left foot 04/21/2017   Antibiotic-induced yeast infection 03/01/2017   Irritable bowel syndrome with both constipation and diarrhea 12/25/2016   Vitamin D deficiency 12/25/2016   Primary osteoarthritis of first carpometacarpal joint of right hand 06/15/2016   Morbid obesity with BMI of 45.0-49.9, adult (HCC) 04/09/2015   CTS (carpal tunnel syndrome) 07/02/2014   Essential tremor 03/23/2013   History of cholecystectomy 06/08/2011    Neuropathy 06/08/2011   Past Medical History:  Diagnosis Date   Essential tremor    GERD (gastroesophageal reflux disease)    History of cholecystectomy    Hypertension    Neuropathy    Palpitations    Seasonal allergies    Sinus infection     Family History  Problem Relation Age of Onset   Colon cancer Neg Hx    Esophageal cancer Neg Hx    Rectal cancer Neg Hx    Stomach cancer Neg Hx     Past Surgical History:  Procedure Laterality Date   CARPAL TUNNEL RELEASE Right 2005   left 2011   CESAREAN SECTION  1989   CHOLECYSTECTOMY  2008   CORRECTION OF HAMMER TOE REPAIR AND BUNION CORRECTION  2018   both feet   Hammer Toe Repair Bilateral 09/11/2016   #2 Toe Bilateral    HEEL SPUR SURGERY Left    history of neuroplasty decompression median nerve at carpal tunnel     LAPAROSCOPIC GASTRIC SLEEVE RESECTION WITH HIATAL HERNIA REPAIR  04/09/2015   Procedure: LAPAROSCOPIC GASTRIC SLEEVE RESECTION WITH HIATAL HERNIA REPAIR;  Surgeon: Glenna Fellows, MD;  Location: [facility];  Service: General;;   thumb joint replacement Bilateral    R- 03/02/17 & L- 07/21/16   UPPER GI ENDOSCOPY  04/09/2015   Procedure: UPPER GI ENDOSCOPY;  Surgeon: Glenna Fellows, MD;  Location: [facility];  Service: General;;   Social History   Occupational History   Occupation: Not currently working post-op    Employer: [employer]  Tobacco Use   Smoking status: Never   Smokeless tobacco: Never  Vaping Use   Vaping Use: Never used  Substance and Sexual Activity   Alcohol use: No   Drug use: No   Sexual activity: Not on file

## 2020-12-29 ENCOUNTER — Encounter: Payer: Self-pay | Admitting: Orthopaedic Surgery

## 2021-01-06 ENCOUNTER — Encounter: Payer: Self-pay | Admitting: Orthopaedic Surgery

## 2021-01-07 ENCOUNTER — Telehealth: Payer: Self-pay | Admitting: Urology

## 2021-01-07 MED ORDER — GABAPENTIN 300 MG PO CAPS: 300.0000 mg | ORAL_CAPSULE | Freq: Every day | ORAL | 3 refills | Status: DC

## 2021-01-07 NOTE — Telephone Encounter (Signed)
DOS - 01/31/21  TENOTOMY 3,4 RIGHT --- 28010 CAPSULOTOMY MPJ RELEASE 1ST RIGHT --- 42683 HALLUX IPJ FUSION RIGHT --- 41962   [facility] EFFECTIVE DATE - 05/25/20   PLAN DEDUCTIBLE - $500.00 W/ $0.00 REMAINING OUT OF POCKET - $2,000.00 W/  $2.36 REMAINING COINSURANCE - 20% COPAY - $0.00   PER [employer] WEB SITE FOR CPT CODES 22979 X'S 2, 28270 AND [id number] Notification or Prior Authorization is not required for the requested services   Decision ID #:[id number]

## 2021-01-10 ENCOUNTER — Other Ambulatory Visit: Payer: Self-pay

## 2021-01-10 ENCOUNTER — Ambulatory Visit (HOSPITAL_COMMUNITY)
Admission: RE | Admit: 2021-01-10 | Discharge: 2021-01-10 | Disposition: A | Discharge status: Home / Self Care | Payer: 59 | Source: Ambulatory Visit | Attending: Orthopaedic Surgery | Admitting: Orthopaedic Surgery

## 2021-01-10 DIAGNOSIS — M5416 Radiculopathy, lumbar region: Secondary | ICD-10-CM | POA: Insufficient documentation

## 2021-01-21 ENCOUNTER — Other Ambulatory Visit: Payer: Self-pay

## 2021-01-21 ENCOUNTER — Encounter: Payer: Self-pay | Admitting: Orthopaedic Surgery

## 2021-01-21 ENCOUNTER — Ambulatory Visit (INDEPENDENT_AMBULATORY_CARE_PROVIDER_SITE_OTHER): Payer: 59 | Admitting: Orthopaedic Surgery

## 2021-01-21 DIAGNOSIS — M48062 Spinal stenosis, lumbar region with neurogenic claudication: Secondary | ICD-10-CM

## 2021-01-22 DIAGNOSIS — M48061 Spinal stenosis, lumbar region without neurogenic claudication: Secondary | ICD-10-CM | POA: Insufficient documentation

## 2021-01-22 NOTE — Progress Notes (Signed)
Office Visit Note   Patient: Ashley [name]           Date of Birth: April 11, 1961           MRN: [id number] Visit Date: 01/21/2021              Requested by: April Manson, NP [address]  [place] [address] PCP: April Manson, NP   Assessment & Plan: Visit Diagnoses:  1. Spinal stenosis of lumbar region with neurogenic claudication     Plan: We reviewed images of her MRI scan I gave her copy report and we discussed findings.  She has a large cyst at L4-5 which is causing high-grade stenosis.  Synovial cyst occupies about half the canal and she has degenerative anterolisthesis from the facets with 11 mm of shift.  At times she is able to walk a considerable distance of the time shorter.  She also finds that she can stand sometimes for an hour without problems.  The intraspinal extradural facet cyst may have come on very slowly.  We will check her back in a month.  If she develops progressive weakness lower extremity she understands that she needs contact us.  We discussed options including simple decompression versus single level fusion with decompression and removal of the intraspinal extradural facet cyst.  On return visit in 1 month we will obtain AP x-ray lumbar spine and a lateral flexion-extension stress view.  Follow-Up Instructions: Return in about 1 month (around 02/21/2021).   Orders:  No orders of the defined types were placed in this encounter.  No orders of the defined types were placed in this encounter.     Procedures: No procedures performed   Clinical Data: No additional findings.   Subjective: Chief Complaint  Patient presents with   Lower Back - Pain    HPI 60 year old female returns with persistent problems with back pain and right leg pain more than left.  Previous CT of the abdomen done showed some foraminal stenosis at L5-S1 on the right.  She has been through therapy without relief.  She has not fallen.  No associated bowel or bladder  symptoms.  She denies chills or fever.  Patient had 2 prescriptions for Percocet 10/325 number 21 tablets 1 prescription in April and 1 in May.  Pain is been present x7 months.  Patient states that at times she has problems standing.  At the time she can walk a longer distance.  She gets some relief with a forward flexed position.  She has increased pain on the right side if she does rotation of the lumbar spine.  Review of Systems all the systems noncontributory to HPI.  Of note is previous gastric bypass surgery she cannot take anti-inflammatories.   Objective: Vital Signs: BP 127/62   Pulse 60   Ht 5\' 7"  (1.702 m)   Wt 265 lb (120.2 kg)   BMI 41.50 kg/m   Physical Exam Constitutional:      Appearance: She is well-developed.  HENT:     Head: Normocephalic.     Right Ear: External ear normal.     Left Ear: External ear normal. There is no impacted cerumen.  Eyes:     Pupils: Pupils are equal, round, and reactive to light.  Neck:     Thyroid: No thyromegaly.     Trachea: No tracheal deviation.  Cardiovascular:     Rate and Rhythm: Normal rate.  Pulmonary:  Effort: Pulmonary effort is normal.  Abdominal:     Palpations: Abdomen is soft.  Musculoskeletal:     Cervical back: No rigidity.  Skin:    General: Skin is warm and dry.  Neurological:     Mental Status: She is alert and oriented to person, place, and time.  Psychiatric:        Behavior: Behavior normal.    Ortho Exam patient ambulating on her toes and heels.  She has some pain with thigh palpation on the right anteriorly as well as pain with straight leg raising on the right at 70 degrees.  Knee and ankle jerk are intact and symmetrical.  Abductor is strong negative logroll of the hips.  Knees flex and extend well.  Specialty Comments:  No specialty comments available.  Imaging: Narrative & Impression  CLINICAL DATA:  Low back pain extending into the right buttocks   EXAM: MRI LUMBAR SPINE WITHOUT CONTRAST    TECHNIQUE: Multiplanar, multisequence MR imaging of the lumbar spine was performed. No intravenous contrast was administered.   COMPARISON:  None.   FINDINGS: Segmentation:  5 lumbar type vertebrae   Alignment:  Grade 1 anterolisthesis at L4-5   Vertebrae:  No fracture, evidence of discitis, or bone lesion.   Conus medullaris and cauda equina: Conus extends to the L1 level. Conus and cauda equina appear normal.   Paraspinal and other soft tissues: No evidence of perispinal mass or inflammation.   Disc levels:   L2-L3: Mild left facet spurring   L3-L4: Facet osteoarthritis with spurring and ligamentum flavum thickening. The disc is narrowed and circumferentially bulging with left more than right subarticular recess narrowing. Spinal stenosis is mild-to-moderate   L4-L5: Disc narrowing and bulging. Facet osteoarthritis with spurring and ligamentum flavum thickening. Right-sided facet joint effusion with medial synovial cyst measuring 11 mm and causing asymmetric, severe right-sided subarticular recess impingement.   L5-S1:Disc narrowing and bulging with mild facet spurring. Moderate narrowing at the bilateral subarticular recess. Patent foramina   IMPRESSION: 1. Disc and facet degeneration most notable at L4-5 where there is anterolisthesis and 11 mm right synovial cyst causing high-grade spinal stenosis. Asymmetric right subarticular recess impingement. 2. Moderate subarticular recess narrowing on the left at L3-4 and bilaterally at L5-S1.     Electronically Signed   By: Marnee Spring M.D.   On: 01/11/2021 10:49     PMFS History: Patient Active Problem List   Diagnosis Date Noted   Spinal stenosis of lumbar region 01/22/2021   Lumbar radicular pain 07/09/2020   Ganglion cyst of finger 09/21/2019   Edema 11/21/2018   Tremor 11/21/2018   Genital herpes simplex 11/17/2018   Severe obesity (BMI 35.0-39.9) with comorbidity (HCC) 09/27/2018   Primary  osteoarthritis involving multiple joints 12/29/2017   S/P gastric bypass 12/29/2017   Gastroesophageal reflux disease without esophagitis 09/24/2017   Non-seasonal allergic rhinitis 09/24/2017   Acquired hallux interphalangeus 04/21/2017   Pain in toe of left foot 04/21/2017   Antibiotic-induced yeast infection 03/01/2017   Irritable bowel syndrome with both constipation and diarrhea 12/25/2016   Vitamin D deficiency 12/25/2016   Primary osteoarthritis of first carpometacarpal joint of right hand 06/15/2016   Morbid obesity with BMI of 45.0-49.9, adult (HCC) 04/09/2015   CTS (carpal tunnel syndrome) 07/02/2014   Essential tremor 03/23/2013   History of cholecystectomy 06/08/2011   Neuropathy 06/08/2011   Past Medical History:  Diagnosis Date   Essential tremor    GERD (gastroesophageal reflux disease)    History  of cholecystectomy    Hypertension    Neuropathy    Palpitations    Seasonal allergies    Sinus infection     Family History  Problem Relation Age of Onset   Colon cancer Neg Hx    Esophageal cancer Neg Hx    Rectal cancer Neg Hx    Stomach cancer Neg Hx     Past Surgical History:  Procedure Laterality Date   CARPAL TUNNEL RELEASE Right 2005   left 2011   CESAREAN SECTION  1989   CHOLECYSTECTOMY  2008   CORRECTION OF HAMMER TOE REPAIR AND BUNION CORRECTION  2018   both feet   Hammer Toe Repair Bilateral 09/11/2016   #2 Toe Bilateral    HEEL SPUR SURGERY Left    history of neuroplasty decompression median nerve at carpal tunnel     LAPAROSCOPIC GASTRIC SLEEVE RESECTION WITH HIATAL HERNIA REPAIR  04/09/2015   Procedure: LAPAROSCOPIC GASTRIC SLEEVE RESECTION WITH HIATAL HERNIA REPAIR;  Surgeon: Glenna Fellows, MD;  Location: [facility];  Service: General;;   thumb joint replacement Bilateral    R- 03/02/17 & L- 07/21/16   UPPER GI ENDOSCOPY  04/09/2015   Procedure: UPPER GI ENDOSCOPY;  Surgeon: Glenna Fellows, MD;  Location: [facility];  Service: General;;    Social History   Occupational History   Occupation: Not currently working post-op    Employer: [employer]  Tobacco Use   Smoking status: Never   Smokeless tobacco: Never  Vaping Use   Vaping Use: Never used  Substance and Sexual Activity   Alcohol use: No   Drug use: No   Sexual activity: Not on file

## 2021-01-29 ENCOUNTER — Other Ambulatory Visit: Payer: Self-pay | Admitting: Podiatry

## 2021-01-29 MED ORDER — CEPHALEXIN 500 MG PO CAPS: 500.0000 mg | ORAL_CAPSULE | Freq: Three times a day (TID) | ORAL | 0 refills | Status: DC

## 2021-01-29 MED ORDER — ONDANSETRON HCL 4 MG PO TABS: 4.0000 mg | ORAL_TABLET | Freq: Three times a day (TID) | ORAL | 0 refills | Status: DC | PRN

## 2021-01-29 MED ORDER — OXYCODONE-ACETAMINOPHEN 10-325 MG PO TABS: 1.0000 | ORAL_TABLET | Freq: Three times a day (TID) | ORAL | 0 refills | Status: AC | PRN

## 2021-01-31 ENCOUNTER — Encounter: Payer: Self-pay | Admitting: Podiatry

## 2021-01-31 DIAGNOSIS — M205X1 Other deformities of toe(s) (acquired), right foot: Secondary | ICD-10-CM

## 2021-01-31 DIAGNOSIS — M2031 Hallux varus (acquired), right foot: Secondary | ICD-10-CM

## 2021-02-05 ENCOUNTER — Telehealth: Payer: Self-pay | Admitting: Radiology

## 2021-02-05 MED ORDER — GABAPENTIN 300 MG PO CAPS: 300.0000 mg | ORAL_CAPSULE | Freq: Every day | ORAL | 3 refills | Status: DC

## 2021-02-05 NOTE — Telephone Encounter (Signed)
Received fax from [facility]. Patient is requesting Gabapentin 300mg  be refilled through this pharmacy. OK for refills?

## 2021-02-05 NOTE — Addendum Note (Signed)
Addended by: Rogers Seeds on: 02/05/2021 01:37 PM   Modules accepted: Orders

## 2021-02-05 NOTE — Telephone Encounter (Signed)
Rx sent to pharmacy   

## 2021-02-06 ENCOUNTER — Ambulatory Visit (INDEPENDENT_AMBULATORY_CARE_PROVIDER_SITE_OTHER): Payer: 59 | Admitting: Podiatrist

## 2021-02-06 ENCOUNTER — Encounter: Payer: 59 | Admitting: Podiatry

## 2021-02-06 ENCOUNTER — Encounter: Payer: Self-pay | Admitting: Podiatrist

## 2021-02-06 ENCOUNTER — Other Ambulatory Visit: Payer: Self-pay

## 2021-02-06 ENCOUNTER — Ambulatory Visit (INDEPENDENT_AMBULATORY_CARE_PROVIDER_SITE_OTHER): Payer: 59

## 2021-02-06 DIAGNOSIS — M2041 Other hammer toe(s) (acquired), right foot: Secondary | ICD-10-CM

## 2021-02-06 DIAGNOSIS — Z9889 Other specified postprocedural states: Secondary | ICD-10-CM | POA: Diagnosis not present

## 2021-02-06 DIAGNOSIS — M2031 Hallux varus (acquired), right foot: Secondary | ICD-10-CM

## 2021-02-06 MED ORDER — OXYCODONE-ACETAMINOPHEN 10-325 MG PO TABS: 1.0000 | ORAL_TABLET | Freq: Three times a day (TID) | ORAL | 0 refills | Status: AC | PRN

## 2021-02-06 NOTE — Progress Notes (Signed)
  Subjective:  Patient ID: Ashley [name], female    DOB: November 17, 1960,  MRN: [id number]  Chief Complaint  Patient presents with   Routine Post Op    POV#1 DOS 09/20/2020 RT FOOT, HALLUX IPJ FUSION W/RELEASE 1ST MPJ, REMOVAL FIXATION HALLUX. Denies fever/chills/nausea/vomiting. Some lateral soreness, pt states may be due to boot.     60 y.o. female presents for her postop visit #1 status post hallux IP joint fusion first metatarsal phalangeal joint right as well as flexor tenotomy digits 3 and 4 right foot.  Overall she states she is doing well relates soreness on the lateral part of her foot possibly due to the boot.  She is wearing the cam boot as instructed.  States she is using pain medicine only at night.  Objective:  Physical Exam: Neurovascular status is intact with mild swelling on the incision site at the right first metatarsal phalangeal joint and hallux IP joint.  Incision site is well coapted with sutures in place at the hallux and the tenotomy sites plantar digits 3 and 4 of the right foot.   No redness, no swelling, no sign of infection is noted.  Calf is supple and soft.  Radiographs: X-ray of the right foot: Excellent appearance of the right foot is seen with screw fixation in the hallux fusing the distal and proximal phalanx.  Good alignment and position is noted.  broken staple still present proximal phalanx of hallux right  Assessment:   1. Status post surgery     Plan:  Patient was evaluated and treated and all questions answered.  Post-operative State -XR reviewed with patient -Dressing applied consisting of sterile gauze, kerlix, and ACE bandage -WBAT in CAM boot -Pain medication refilled  She will follow-up with Dr. Al Corpus in 1 week if any concerns arise prior to that visit she is instructed to call.

## 2021-02-07 ENCOUNTER — Encounter: Payer: 59 | Admitting: Podiatry

## 2021-02-13 ENCOUNTER — Encounter: Payer: Self-pay | Admitting: Podiatry

## 2021-02-13 ENCOUNTER — Ambulatory Visit (INDEPENDENT_AMBULATORY_CARE_PROVIDER_SITE_OTHER): Payer: 59 | Admitting: Podiatry

## 2021-02-13 ENCOUNTER — Other Ambulatory Visit: Payer: Self-pay

## 2021-02-13 DIAGNOSIS — M2031 Hallux varus (acquired), right foot: Secondary | ICD-10-CM

## 2021-02-13 DIAGNOSIS — M2041 Other hammer toe(s) (acquired), right foot: Secondary | ICD-10-CM

## 2021-02-13 DIAGNOSIS — Z9889 Other specified postprocedural states: Secondary | ICD-10-CM

## 2021-02-13 NOTE — Progress Notes (Signed)
She presents today date of surgery 01/31/2021 right foot hallux IPJ fusion with screw and tenotomy's.  She denies fever chills nausea vomiting muscle aches pains calf pain back pain chest pain shortness of breath.  Dressed her dressing intact sutures are intact today margins are not well coapted dorsally.  Objective: No erythema edema cellulitis drainage or odor sutures are intact margins are not well coapted dorsally removing it removed suture to the plantar aspect of toes 3 and 4 the right foot as well as the distal aspect of the toe of the hallux.  Assessment: Well-healing surgical foot.  Plan: Continue nonweightbearing status utilizing her boot until we get of cross bridging around the metatarsal phalangeal joint.  Follow-up with her in a week for suture removal.

## 2021-02-18 ENCOUNTER — Other Ambulatory Visit: Payer: Self-pay

## 2021-02-18 ENCOUNTER — Ambulatory Visit (INDEPENDENT_AMBULATORY_CARE_PROVIDER_SITE_OTHER): Payer: 59 | Admitting: Podiatry

## 2021-02-18 ENCOUNTER — Encounter: Payer: Self-pay | Admitting: Podiatry

## 2021-02-18 DIAGNOSIS — M2041 Other hammer toe(s) (acquired), right foot: Secondary | ICD-10-CM

## 2021-02-18 DIAGNOSIS — Z9889 Other specified postprocedural states: Secondary | ICD-10-CM

## 2021-02-18 DIAGNOSIS — M2031 Hallux varus (acquired), right foot: Secondary | ICD-10-CM

## 2021-02-18 NOTE — Progress Notes (Signed)
She presents today for postop visit date of surgery 01/31/2021 hallux IPJ fusion with release of the first metatarsophalangeal joint.  States that she is doing quite well she is here for suture removal.  Objective: Sutures are intact margins well coapted sutures removed today remaining well coapted margins.  Assessment: Redressed today dressed compressive dressing I will let her start getting this wet this weekend.  Follow-up with her in 2 weeks

## 2021-02-21 ENCOUNTER — Ambulatory Visit: Payer: 59 | Admitting: Orthopaedic Surgery

## 2021-02-25 ENCOUNTER — Encounter: Payer: 59 | Admitting: Podiatry

## 2021-03-04 ENCOUNTER — Ambulatory Visit: Payer: 59 | Admitting: Orthopaedic Surgery

## 2021-03-06 ENCOUNTER — Other Ambulatory Visit: Payer: Self-pay

## 2021-03-06 ENCOUNTER — Ambulatory Visit (INDEPENDENT_AMBULATORY_CARE_PROVIDER_SITE_OTHER): Payer: 59 | Admitting: Podiatry

## 2021-03-06 DIAGNOSIS — M2031 Hallux varus (acquired), right foot: Secondary | ICD-10-CM

## 2021-03-06 DIAGNOSIS — M2041 Other hammer toe(s) (acquired), right foot: Secondary | ICD-10-CM

## 2021-03-11 ENCOUNTER — Encounter: Payer: 59 | Admitting: Podiatry

## 2021-03-11 NOTE — Progress Notes (Signed)
  Subjective:  Patient ID: Ashley [name], female    DOB: 1960/08/11,  MRN: [id number]  Chief Complaint  Patient presents with   Routine Post Op    POV #4 DOS 01/31/2021 HALLUX IPJ FUSION RT W/RELEASE 1ST MPJ, FLEXOR TENOTOMIES 3RD & 4TH TOES RT FOOT- pt states she is not having any pain some soreness , there is a place on her right foot on the right side that she is concerned about      60 y.o. female returns for post-op check.   Review of Systems: Negative except as noted in the HPI. Denies N/V/F/Ch.   Objective:  There were no vitals filed for this visit. There is no height or weight on file to calculate BMI. Constitutional Well developed. Well nourished.  Vascular Foot warm and well perfused. Capillary refill normal to all digits.   Neurologic Normal speech. Oriented to person, place, and time. Epicritic sensation to light touch grossly present bilaterally.  Dermatologic Well-healed incision, there is a erythematous area on the fifth met base  Orthopedic: Tenderness to palpation noted about the surgical site.    Assessment:   1. Hammertoe of right foot   2. Hallux malleus of right foot    Plan:  Patient was evaluated and treated and all questions answered.  S/p foot surgery  -Healing well I advised to keep pressure off the fifth met base where she had what appears to be a small blister from the Ace wrap rubbing. -Follow-up with Dr. Milinda Pointer for new radiographs and hopefully final postop visit  No follow-ups on file.

## 2021-03-13 ENCOUNTER — Ambulatory Visit (INDEPENDENT_AMBULATORY_CARE_PROVIDER_SITE_OTHER): Payer: 59 | Admitting: Podiatry

## 2021-03-13 ENCOUNTER — Other Ambulatory Visit: Payer: Self-pay

## 2021-03-13 ENCOUNTER — Ambulatory Visit (INDEPENDENT_AMBULATORY_CARE_PROVIDER_SITE_OTHER): Payer: 59

## 2021-03-13 DIAGNOSIS — Z9889 Other specified postprocedural states: Secondary | ICD-10-CM

## 2021-03-13 DIAGNOSIS — M2041 Other hammer toe(s) (acquired), right foot: Secondary | ICD-10-CM

## 2021-03-13 DIAGNOSIS — M2031 Hallux varus (acquired), right foot: Secondary | ICD-10-CM

## 2021-03-13 NOTE — Progress Notes (Signed)
She presents today date of surgery 01/31/2021 hallux IPJ fusion.  Flexor tenotomy's to toes 3 and 4 on the right foot.  She denies fever chills nausea run muscle aches pains states that seems to be doing pretty well.  Objective: Vital signs are stable alert oriented x3 there is no erythema just mild edema no cellulitis drainage or odor she has good range of motion at the first metatarsophalangeal joint though somewhat tender.  She has no range of motion at the interphalangeal joint and radiographically it appears to be fusing quite nicely and in good position with good compression.  Assessment: Well-healing surgical foot.  Plan: I like to follow-up with her in a couple weeks for another set of x-rays and hopefully be able to release her back to regular shoe gear at that time.

## 2021-03-18 ENCOUNTER — Encounter: Payer: Self-pay | Admitting: Orthopaedic Surgery

## 2021-03-18 ENCOUNTER — Ambulatory Visit: Payer: Self-pay

## 2021-03-18 ENCOUNTER — Other Ambulatory Visit: Payer: Self-pay

## 2021-03-18 ENCOUNTER — Ambulatory Visit (INDEPENDENT_AMBULATORY_CARE_PROVIDER_SITE_OTHER): Payer: 59 | Admitting: Orthopaedic Surgery

## 2021-03-18 VITALS — BP 136/78 | HR 64

## 2021-03-18 DIAGNOSIS — M5416 Radiculopathy, lumbar region: Secondary | ICD-10-CM

## 2021-03-18 DIAGNOSIS — M48062 Spinal stenosis, lumbar region with neurogenic claudication: Secondary | ICD-10-CM | POA: Diagnosis not present

## 2021-03-18 NOTE — Progress Notes (Signed)
Office Visit Note   Patient: Ashley [name]           Date of Birth: [birth year]           MRN: [id number] Visit Date: 03/18/2021              Requested by: April Manson, NP [address],  [place] [address] PCP: April Manson, NP   Assessment & Plan: Visit Diagnoses:  1. Lumbar radicular pain   2. Spinal stenosis of lumbar region with neurogenic claudication     Plan: Patient has L4-5 instability with shifting of 5 mm on flexion-extension and intraspinal extradural facet cyst causing severe right subarticular recess compression and central stenosis.  She does have some foraminal narrowing at L3-4 and also L5-S1 no areas that are severe other than at L4-5.  Patient will require single level instrumented fusion.  We discussed single level Gill procedure decompression interbody as well as lateral instrumented fusion.  Risks of surgery discussed in detail.  Questions were elicited and answered.  She had recent foot surgery and when she is out walking normally again she can call about surgical scheduling.  Follow-Up Instructions: Return in about 1 month (around 04/18/2021).   Orders:  Orders Placed This Encounter  Procedures   XR Lumbar Spine 2-3 Views   No orders of the defined types were placed in this encounter.     Procedures: No procedures performed   Clinical Data: No additional findings.   Subjective: Chief Complaint  Patient presents with   Lower Back - Follow-up    HPI 60 year old female returns with ongoing problems with neurogenic claudication symptoms.  Patient has degenerative anterolisthesis with shifting a 5 mm and 11 mm right synovial cyst causing high-grade central stenosis. MRI scan was on 01/11/2021.  She gets relief with supine position.  She has to stop and sit otherwise she states her legs will give way.  She has problems walking more than 1 or 2 blocks.  She notes that her symptoms are gradually progressing over the last year.  No  associated bowel or bladder symptoms. Review of Systems previous gastric bypass surgery she is unable to take anti-inflammatories.  She has been treated with gabapentin with slight improvement.   Objective: Vital Signs: BP 136/78 (BP Location: Right Arm, Patient Position: Sitting, Cuff Size: Normal)   Pulse 64   SpO2 98%   Physical Exam Constitutional:      Appearance: She is well-developed.  HENT:     Head: Normocephalic.     Right Ear: External ear normal.     Left Ear: External ear normal. There is no impacted cerumen.  Eyes:     Pupils: Pupils are equal, round, and reactive to light.  Neck:     Thyroid: No thyromegaly.     Trachea: No tracheal deviation.  Cardiovascular:     Rate and Rhythm: Normal rate.  Pulmonary:     Effort: Pulmonary effort is normal.  Abdominal:     Palpations: Abdomen is soft.  Musculoskeletal:     Cervical back: No rigidity.  Skin:    General: Skin is warm and dry.  Neurological:     Mental Status: She is alert and oriented to person, place, and time.  Psychiatric:        Behavior: Behavior normal.    Ortho Exam straight leg raising 90 degrees in the right positive sciatic notch tenderness.  Anterior tib EHL gastrocsoleus is normal on the  left she has slight EHL weakness on the right.  Specialty Comments:  No specialty comments available.  Imaging: Narrative & Impression  CLINICAL DATA:  Low back pain extending into the right buttocks   EXAM: MRI LUMBAR SPINE WITHOUT CONTRAST   TECHNIQUE: Multiplanar, multisequence MR imaging of the lumbar spine was performed. No intravenous contrast was administered.   COMPARISON:  None.   FINDINGS: Segmentation:  5 lumbar type vertebrae   Alignment:  Grade 1 anterolisthesis at L4-5   Vertebrae:  No fracture, evidence of discitis, or bone lesion.   Conus medullaris and cauda equina: Conus extends to the L1 level. Conus and cauda equina appear normal.   Paraspinal and other soft tissues: No  evidence of perispinal mass or inflammation.   Disc levels:   L2-L3: Mild left facet spurring   L3-L4: Facet osteoarthritis with spurring and ligamentum flavum thickening. The disc is narrowed and circumferentially bulging with left more than right subarticular recess narrowing. Spinal stenosis is mild-to-moderate   L4-L5: Disc narrowing and bulging. Facet osteoarthritis with spurring and ligamentum flavum thickening. Right-sided facet joint effusion with medial synovial cyst measuring 11 mm and causing asymmetric, severe right-sided subarticular recess impingement.   L5-S1:Disc narrowing and bulging with mild facet spurring. Moderate narrowing at the bilateral subarticular recess. Patent foramina   IMPRESSION: 1. Disc and facet degeneration most notable at L4-5 where there is anterolisthesis and 11 mm right synovial cyst causing high-grade spinal stenosis. Asymmetric right subarticular recess impingement. 2. Moderate subarticular recess narrowing on the left at L3-4 and bilaterally at L5-S1.     Electronically Signed   By: Marnee Spring M.D.   On: 01/11/2021 10:49    Result Narrative  03/18/21  AP and lateral lumbar images with lateral flexion-extension images  demonstrates anterolisthesis at L4-5 with a shift from 0 degrees to 5  degrees with flexion extension.  Degenerative facets are noted pars  intact.   Impression: L4-5 degenerative anterolisthesis as described above. PMFS History: Patient Active Problem List   Diagnosis Date Noted   Spinal stenosis of lumbar region 01/22/2021   Lumbar radicular pain 07/09/2020   Ganglion cyst of finger 09/21/2019   Edema 11/21/2018   Tremor 11/21/2018   Genital herpes simplex 11/17/2018   Severe obesity (BMI 35.0-39.9) with comorbidity (HCC) 09/27/2018   Primary osteoarthritis involving multiple joints 12/29/2017   S/P gastric bypass 12/29/2017   Gastroesophageal reflux disease without esophagitis 09/24/2017    Non-seasonal allergic rhinitis 09/24/2017   Acquired hallux interphalangeus 04/21/2017   Pain in toe of left foot 04/21/2017   Antibiotic-induced yeast infection 03/01/2017   Irritable bowel syndrome with both constipation and diarrhea 12/25/2016   Vitamin D deficiency 12/25/2016   Primary osteoarthritis of first carpometacarpal joint of right hand 06/15/2016   Morbid obesity with BMI of 45.0-49.9, adult (HCC) 04/09/2015   CTS (carpal tunnel syndrome) 07/02/2014   Essential tremor 03/23/2013   History of cholecystectomy 06/08/2011   Neuropathy 06/08/2011   Past Medical History:  Diagnosis Date   Essential tremor    GERD (gastroesophageal reflux disease)    History of cholecystectomy    Hypertension    Neuropathy    Palpitations    Seasonal allergies    Sinus infection     Family History  Problem Relation Age of Onset   Colon cancer Neg Hx    Esophageal cancer Neg Hx    Rectal cancer Neg Hx    Stomach cancer Neg Hx     Past Surgical History:  Procedure Laterality Date   CARPAL TUNNEL RELEASE Right 2005   left 2011   CESAREAN SECTION  1989   CHOLECYSTECTOMY  2008   CORRECTION OF HAMMER TOE REPAIR AND BUNION CORRECTION  2018   both feet   Hammer Toe Repair Bilateral 09/11/2016   #2 Toe Bilateral    HEEL SPUR SURGERY Left    history of neuroplasty decompression median nerve at carpal tunnel     LAPAROSCOPIC GASTRIC SLEEVE RESECTION WITH HIATAL HERNIA REPAIR  04/09/2015   Procedure: LAPAROSCOPIC GASTRIC SLEEVE RESECTION WITH HIATAL HERNIA REPAIR;  Surgeon: Glenna Fellows, MD;  Location: [facility];  Service: General;;   thumb joint replacement Bilateral    R- 03/02/17 & L- 07/21/16   UPPER GI ENDOSCOPY  04/09/2015   Procedure: UPPER GI ENDOSCOPY;  Surgeon: Glenna Fellows, MD;  Location: [facility];  Service: General;;   Social History   Occupational History   Occupation: Not currently working post-op    Employer: [employer]  Tobacco Use   Smoking status: Never    Smokeless tobacco: Never  Vaping Use   Vaping Use: Never used  Substance and Sexual Activity   Alcohol use: No   Drug use: No   Sexual activity: Not on file

## 2021-04-03 ENCOUNTER — Ambulatory Visit (INDEPENDENT_AMBULATORY_CARE_PROVIDER_SITE_OTHER): Payer: 59

## 2021-04-03 ENCOUNTER — Ambulatory Visit (INDEPENDENT_AMBULATORY_CARE_PROVIDER_SITE_OTHER): Payer: 59 | Admitting: Podiatry

## 2021-04-03 ENCOUNTER — Other Ambulatory Visit: Payer: Self-pay

## 2021-04-03 ENCOUNTER — Encounter: Payer: Self-pay | Admitting: Podiatry

## 2021-04-03 DIAGNOSIS — M2031 Hallux varus (acquired), right foot: Secondary | ICD-10-CM

## 2021-04-03 DIAGNOSIS — M2041 Other hammer toe(s) (acquired), right foot: Secondary | ICD-10-CM

## 2021-04-03 DIAGNOSIS — Z9889 Other specified postprocedural states: Secondary | ICD-10-CM

## 2021-04-03 NOTE — Progress Notes (Signed)
She presents today postop visit date of surgery 01/31/2021 hallux IPJ fusion with release of the first metatarsophalangeal joint.  She states that I did not have much I can do on it still continues to wear her Darco shoe she says she has not had an office ready to go yet or not.  Objective: Vital signs temps alert oriented x3 there is minimal edema no erythema cellulitis drainage or odor she still has stiffness at the first metatarsophalangeal joint though she has good fusion and no pain at the level of the hallux interphalangeal joint.  Radiographs taken today demonstrate an osseously mature individual with screw through the hallux.  There appears to be good fusion at the hallux interphalangeal joint and spreading laterally.  Assessment: Well-healing surgical foot.  Plan: Encourage range of motion exercises and some strength training today discussed her getting back in her regular tennis shoes and ambulating as normal as possible she understands and is amenable to it I offered physical therapy to her and she declined.

## 2021-04-13 ENCOUNTER — Other Ambulatory Visit: Payer: Self-pay | Admitting: Orthopaedic Surgery

## 2021-04-15 ENCOUNTER — Encounter: Payer: Self-pay | Admitting: Orthopaedic Surgery

## 2021-04-15 ENCOUNTER — Ambulatory Visit (INDEPENDENT_AMBULATORY_CARE_PROVIDER_SITE_OTHER): Payer: 59 | Admitting: Orthopaedic Surgery

## 2021-04-15 ENCOUNTER — Other Ambulatory Visit: Payer: Self-pay

## 2021-04-15 VITALS — BP 142/87 | HR 61 | Wt 265.0 lb

## 2021-04-15 DIAGNOSIS — M48062 Spinal stenosis, lumbar region with neurogenic claudication: Secondary | ICD-10-CM

## 2021-04-15 DIAGNOSIS — Z6841 Body Mass Index (BMI) 40.0 and over, adult: Secondary | ICD-10-CM | POA: Diagnosis not present

## 2021-04-15 NOTE — Progress Notes (Signed)
Office Visit Note   Patient: Ashley [name]           Date of Birth: June 11, 1960           MRN: [id number] Visit Date: 04/15/2021              Requested by: April Manson, NP [address],  [place] [address] PCP: April Manson, NP   Assessment & Plan: Visit Diagnoses:  1. Spinal stenosis of lumbar region with neurogenic claudication   2. Body mass index 40.0-44.9, adult (HCC)     Plan: Patient will continue to work on some weight loss and I will recheck her in 2 months.  We discussed instrumented fusion, operative technique reviewed some x-rays of patients he did had single level L4-5 fusion.  Risk surgery discussed recheck in 2 months.  We will weigh patient on return.  Follow-Up Instructions: Return in about 2 months (around 06/15/2021).   Orders:  No orders of the defined types were placed in this encounter.  No orders of the defined types were placed in this encounter.     Procedures: No procedures performed   Clinical Data: No additional findings.   Subjective: Chief Complaint  Patient presents with   Lower Back - Follow-up    HPI 60 year old female returns she still getting over foot surgery but is gradually walking better.  Previous operative foot was already successfully surgically treated and she states her podiatrist states she is making good progress of the right foot.  Patient is taking gabapentin for pain that recently renewed it.  States her pain in her back is 5 out of 10.  She has claudication symptoms with stenosis at L4-5 shifting 5 mm with multifactorial subarticular recess compression and central stenosis.  Mild changes at L3-4 and L5-S1 but nothing as severe as the L4-5 level.  Once her foot is better she states she would be ready for single level fusion.  Review of Systems all other systems noncontributory.   Objective: Vital Signs: BP (!) 142/87 (BP Location: Right Arm, Patient Position: Sitting, Cuff Size: Large)   Pulse 61    Wt 265 lb (120.2 kg)   SpO2 98%   BMI 41.50 kg/m   Physical Exam Constitutional:      Appearance: She is well-developed.  HENT:     Head: Normocephalic.     Right Ear: External ear normal.     Left Ear: External ear normal. There is no impacted cerumen.  Eyes:     Pupils: Pupils are equal, round, and reactive to light.  Neck:     Thyroid: No thyromegaly.     Trachea: No tracheal deviation.  Cardiovascular:     Rate and Rhythm: Normal rate.  Pulmonary:     Effort: Pulmonary effort is normal.  Abdominal:     Palpations: Abdomen is soft.  Musculoskeletal:     Cervical back: No rigidity.  Skin:    General: Skin is warm and dry.  Neurological:     Mental Status: She is alert and oriented to person, place, and time.  Psychiatric:        Behavior: Behavior normal.    Ortho Exam patient has pain straight leg raising on the right 90 degrees.  Anterior tib gastrocsoleus is active.  Slight limp with some mild foot pain from recent hallux IP fusion.  Patient is amatory in regular shoes.  Specialty Comments:  No specialty comments available.  Imaging: No results found.  PMFS History: Patient Active Problem List   Diagnosis Date Noted   Spinal stenosis of lumbar region 01/22/2021   Ganglion cyst of finger 09/21/2019   Edema 11/21/2018   Tremor 11/21/2018   Genital herpes simplex 11/17/2018   Primary osteoarthritis involving multiple joints 12/29/2017   S/P gastric bypass 12/29/2017   Gastroesophageal reflux disease without esophagitis 09/24/2017   Non-seasonal allergic rhinitis 09/24/2017   Acquired hallux interphalangeus 04/21/2017   Pain in toe of left foot 04/21/2017   Antibiotic-induced yeast infection 03/01/2017   Irritable bowel syndrome with both constipation and diarrhea 12/25/2016   Vitamin D deficiency 12/25/2016   Primary osteoarthritis of first carpometacarpal joint of right hand 06/15/2016   CTS (carpal tunnel syndrome) 07/02/2014   Essential tremor  03/23/2013   History of cholecystectomy 06/08/2011   Neuropathy 06/08/2011   Past Medical History:  Diagnosis Date   Essential tremor    GERD (gastroesophageal reflux disease)    History of cholecystectomy    Hypertension    Neuropathy    Palpitations    Seasonal allergies    Sinus infection     Family History  Problem Relation Age of Onset   Colon cancer Neg Hx    Esophageal cancer Neg Hx    Rectal cancer Neg Hx    Stomach cancer Neg Hx     Past Surgical History:  Procedure Laterality Date   CARPAL TUNNEL RELEASE Right 2005   left 2011   CESAREAN SECTION  1989   CHOLECYSTECTOMY  2008   CORRECTION OF HAMMER TOE REPAIR AND BUNION CORRECTION  2018   both feet   Hammer Toe Repair Bilateral 09/11/2016   #2 Toe Bilateral    HEEL SPUR SURGERY Left    history of neuroplasty decompression median nerve at carpal tunnel     LAPAROSCOPIC GASTRIC SLEEVE RESECTION WITH HIATAL HERNIA REPAIR  04/09/2015   Procedure: LAPAROSCOPIC GASTRIC SLEEVE RESECTION WITH HIATAL HERNIA REPAIR;  Surgeon: Glenna Fellows, MD;  Location: [facility];  Service: General;;   thumb joint replacement Bilateral    R- 03/02/17 & L- 07/21/16   UPPER GI ENDOSCOPY  04/09/2015   Procedure: UPPER GI ENDOSCOPY;  Surgeon: Glenna Fellows, MD;  Location: [facility];  Service: General;;   Social History   Occupational History   Occupation: Not currently working post-op    Employer: [employer]  Tobacco Use   Smoking status: Never   Smokeless tobacco: Never  Vaping Use   Vaping Use: Never used  Substance and Sexual Activity   Alcohol use: No   Drug use: No   Sexual activity: Not on file

## 2021-04-16 ENCOUNTER — Encounter: Payer: Self-pay | Admitting: Orthopaedic Surgery

## 2021-04-21 ENCOUNTER — Other Ambulatory Visit: Payer: Self-pay | Admitting: Orthopaedic Surgery

## 2021-04-22 ENCOUNTER — Encounter: Payer: Self-pay | Admitting: Orthopaedic Surgery

## 2021-04-30 ENCOUNTER — Ambulatory Visit (INDEPENDENT_AMBULATORY_CARE_PROVIDER_SITE_OTHER): Payer: 59 | Admitting: Surgery

## 2021-04-30 ENCOUNTER — Encounter: Payer: Self-pay | Admitting: Surgery

## 2021-04-30 VITALS — BP 133/83 | HR 51 | Wt 271.8 lb

## 2021-04-30 DIAGNOSIS — M48062 Spinal stenosis, lumbar region with neurogenic claudication: Secondary | ICD-10-CM

## 2021-04-30 NOTE — Progress Notes (Signed)
60 year old white female with history of L4-5/HNP/stenosis and facet cyst comes in for preop evaluation.  States that symptoms unchanged from previous visit and she is wanting to proceed with L4-5 EXCISION OF INTRASPINAL EXTRADURAL FACET CYST, RIGHT TRANSFORAMINAL LUMBAR INTERBODY FUSION, PEDICLE INSTRUMENTATION, CAGE.  Today history and physical performed.  Review of systems negative.  Surgical procedure discussed along with potential rehab/recovery time.  All questions answered and she wishes to proceed.

## 2021-05-03 ENCOUNTER — Encounter: Payer: Self-pay | Admitting: Orthopaedic Surgery

## 2021-05-05 NOTE — Pre-Procedure Instructions (Signed)
Surgical Instructions    Your procedure is scheduled on Friday, December 16th.  Report to [facility] Main Entrance "A" at 5:30 A.M., then check in with the Admitting office.  Call this number if you have problems the morning of surgery:  [phone]   If you have any questions prior to your surgery date call [phone]: Open Monday-Friday 8am-4pm    Remember:  Do not eat after midnight the night before your surgery  You may drink clear liquids until 4:30 a.m. the morning of your surgery.   Clear liquids allowed are: Water, Non-Citrus Juices (without pulp), Carbonated Beverages, Clear Tea, Black Coffee Only, and Gatorade    Take these medicines the morning of surgery with A SIP OF WATER  dicyclomine (BENTYL)  fluticasone (FLONASE) gabapentin (NEURONTIN)  levocetirizine (XYZAL) propranolol ER (INDERAL LA)  As needed: hydrOXYzine (ATARAX/VISTARIL montelukast (SINGULAIR)  As of today, STOP taking any Aspirin (unless otherwise instructed by your surgeon) Aleve, Naproxen, Ibuprofen, Motrin, Advil, Goody's, BC's, all herbal medications, fish oil, and all vitamins.                     Do NOT Smoke (Tobacco/Vaping) or drink Alcohol 24 hours prior to your procedure.  If you use a CPAP at night, you may bring all equipment for your overnight stay.   Contacts, glasses, piercing's, hearing aid's, dentures or partials may not be worn into surgery, please bring cases for these belongings.    For patients admitted to the hospital, discharge time will be determined by your treatment team.   Patients discharged the day of surgery will not be allowed to drive home, and someone needs to stay with them for 24 hours.  NO VISITORS WILL BE ALLOWED IN PRE-OP WHERE PATIENTS GET READY FOR SURGERY.  ONLY 1 SUPPORT PERSON MAY BE PRESENT IN THE WAITING ROOM WHILE YOU ARE IN SURGERY.  IF YOU ARE TO BE ADMITTED, ONCE YOU ARE IN YOUR ROOM YOU WILL BE ALLOWED TWO (2) VISITORS.  Minor children may have two  parents present. Special consideration for safety and communication needs will be reviewed on a case by case basis.   Special instructions:   [facility]- Preparing For Surgery  Before surgery, you can play an important role. Because skin is not sterile, your skin needs to be as free of germs as possible. You can reduce the number of germs on your skin by washing with CHG (chlorahexidine gluconate) Soap before surgery.  CHG is an antiseptic cleaner which kills germs and bonds with the skin to continue killing germs even after washing.    Oral Hygiene is also important to reduce your risk of infection.  Remember - BRUSH YOUR TEETH THE MORNING OF SURGERY WITH YOUR REGULAR TOOTHPASTE  Please do not use if you have an allergy to CHG or antibacterial soaps. If your skin becomes reddened/irritated stop using the CHG.  Do not shave (including legs and underarms) for at least 48 hours prior to first CHG shower. It is OK to shave your face.  Please follow these instructions carefully.   Shower the NIGHT BEFORE SURGERY and the MORNING OF SURGERY  If you chose to wash your hair, wash your hair first as usual with your normal shampoo.  After you shampoo, rinse your hair and body thoroughly to remove the shampoo.  Use CHG Soap as you would any other liquid soap. You can apply CHG directly to the skin and wash gently with a scrungie or a clean washcloth.  Apply the CHG Soap to your body ONLY FROM THE NECK DOWN.  Do not use on open wounds or open sores. Avoid contact with your eyes, ears, mouth and genitals (private parts). Wash Face and genitals (private parts)  with your normal soap.   Wash thoroughly, paying special attention to the area where your surgery will be performed.  Thoroughly rinse your body with warm water from the neck down.  DO NOT shower/wash with your normal soap after using and rinsing off the CHG Soap.  Pat yourself dry with a CLEAN TOWEL.  Wear CLEAN PAJAMAS to bed the night  before surgery  Place CLEAN SHEETS on your bed the night before your surgery  DO NOT SLEEP WITH PETS.   Day of Surgery: Shower with CHG soap. Do not wear jewelry, make up, nail polish, gel polish, artificial nails, or any other type of covering on natural nails including finger and toenails. If patients have artificial nails, gel coating, etc. that need to be removed by a nail salon please have this removed prior to surgery. Surgery may need to be canceled/delayed if the surgeon/ anesthesia feels like the patient is unable to be adequately monitored. Do not wear lotions, powders, perfumes, or deodorant. Do not shave 48 hours prior to surgery.  Do not bring valuables to the hospital. [facility] is not responsible for any belongings or valuables. Wear Clean/Comfortable clothing the morning of surgery Remember to brush your teeth WITH YOUR REGULAR TOOTHPASTE.   Please read over the following fact sheets that you were given.   3 days prior to your procedure or After your COVID test   You are not required to quarantine however you are required to wear a well-fitting mask when you are out and around people not in your household. If your mask becomes wet or soiled, replace with a new one.   Wash your hands often with soap and water for 20 seconds or clean your hands with an alcohol-based hand sanitizer that contains at least 60% alcohol.   Do not share personal items.   Notify your provider:  o if you are in close contact with someone who has COVID  o or if you develop a fever of 100.4 or greater, sneezing, cough, sore throat, shortness of breath or body aches.

## 2021-05-05 NOTE — Telephone Encounter (Signed)
Have you received these forms?

## 2021-05-06 ENCOUNTER — Encounter (HOSPITAL_COMMUNITY): Payer: Self-pay

## 2021-05-06 ENCOUNTER — Other Ambulatory Visit: Payer: Self-pay

## 2021-05-06 ENCOUNTER — Encounter (HOSPITAL_COMMUNITY)
Admission: RE | Admit: 2021-05-06 | Discharge: 2021-05-06 | Disposition: A | Discharge status: Home / Self Care | Payer: 59 | Source: Ambulatory Visit | Attending: Orthopaedic Surgery | Admitting: Orthopaedic Surgery

## 2021-05-06 VITALS — BP 147/94 | HR 64 | Temp 97.9°F | Resp 19 | Ht 67.0 in | Wt 269.7 lb

## 2021-05-06 DIAGNOSIS — Z20822 Contact with and (suspected) exposure to covid-19: Secondary | ICD-10-CM | POA: Diagnosis not present

## 2021-05-06 DIAGNOSIS — Z01818 Encounter for other preprocedural examination: Secondary | ICD-10-CM | POA: Diagnosis present

## 2021-05-06 DIAGNOSIS — I2510 Atherosclerotic heart disease of native coronary artery without angina pectoris: Secondary | ICD-10-CM | POA: Diagnosis not present

## 2021-05-06 LAB — BASIC METABOLIC PANEL
Anion gap: 5 (ref 5–15)
BUN: 19 mg/dL (ref 6–20)
CO2: 30 mmol/L (ref 22–32)
Calcium: 9 mg/dL (ref 8.9–10.3)
Chloride: 103 mmol/L (ref 98–111)
Creatinine, Ser: 1.13 mg/dL — ABNORMAL HIGH (ref 0.44–1.00)
GFR, Estimated: 56 mL/min — ABNORMAL LOW (ref >60)
Glucose, Bld: 94 mg/dL (ref 70–99)
Potassium: 3.7 mmol/L (ref 3.5–5.1)
Sodium: 138 mmol/L (ref 135–145)

## 2021-05-06 LAB — TYPE AND SCREEN
ABO/RH(D): A POS
Antibody Screen: NEGATIVE

## 2021-05-06 LAB — CBC
HCT: 41.6 % (ref 36.0–46.0)
Hemoglobin: 13.6 g/dL (ref 12.0–15.0)
MCH: 31.6 pg (ref 26.0–34.0)
MCHC: 32.7 g/dL (ref 30.0–36.0)
MCV: 96.5 fL (ref 80.0–100.0)
Platelets: 250 10*3/uL (ref 150–400)
RBC: 4.31 MIL/uL (ref 3.87–5.11)
RDW: 13.1 % (ref 11.5–15.5)
WBC: 7.1 10*3/uL (ref 4.0–10.5)
nRBC: 0 % (ref 0.0–0.2)

## 2021-05-06 LAB — SURGICAL PCR SCREEN
MRSA, PCR: NEGATIVE
Staphylococcus aureus: NEGATIVE

## 2021-05-06 LAB — SARS CORONAVIRUS 2 (TAT 6-24 HRS): SARS Coronavirus 2: NEGATIVE

## 2021-05-06 NOTE — Progress Notes (Signed)
PCP - Dr. Kathlen Brunswick Cardiologist - denies  PPM/ICD - n/a  Chest x-ray - denies EKG - 05/06/21 Stress Test - denies ECHO - denies Cardiac Cath - denies  Sleep Study - denies CPAP - denies  Blood Thinner Instructions: n/a Aspirin Instructions: n/a  ERAS Protcol -Clear liquids until 0430 DOS PRE-SURGERY Ensure or G2- none ordered  COVID TEST- 05/06/21, done in PAT  Anesthesia review: No  Patient denies shortness of breath, fever, cough and chest pain at PAT appointment   All instructions explained to the patient, with a verbal understanding of the material. Patient agrees to go over the instructions while at home for a better understanding. Patient also instructed to self quarantine after being tested for COVID-19. The opportunity to ask questions was provided.

## 2021-05-07 ENCOUNTER — Other Ambulatory Visit: Payer: Self-pay

## 2021-05-08 NOTE — Anesthesia Preprocedure Evaluation (Addendum)
Anesthesia Evaluation  Patient identified by MRN, date of birth, ID band Patient awake    Reviewed: Allergy & Precautions, NPO status , Patient's Chart, lab work & pertinent test results  Airway Mallampati: II  TM Distance: >3 FB Neck ROM: Full    Dental   Pulmonary neg pulmonary ROS,    Pulmonary exam normal        Cardiovascular hypertension, Pt. on medications and Pt. on home beta blockers  Rhythm:Regular Rate:Normal     Neuro/Psych negative neurological ROS  negative psych ROS   GI/Hepatic Neg liver ROS, GERD  ,  Endo/Other  negative endocrine ROS  Renal/GU negative Renal ROS  negative genitourinary   Musculoskeletal  (+) Arthritis , Osteoarthritis,    Abdominal Normal abdominal exam  (+)   Peds  Hematology negative hematology ROS (+)   Anesthesia Other Findings   Reproductive/Obstetrics                            Anesthesia Physical Anesthesia Plan  ASA: 2  Anesthesia Plan: General   Post-op Pain Management: Tylenol PO (pre-op), Celebrex PO (pre-op) and Ketamine IV   Induction: Intravenous  PONV Risk Score and Plan: 3 and Ondansetron and Dexamethasone  Airway Management Planned: Mask and Oral ETT  Additional Equipment: None  Intra-op Plan:   Post-operative Plan: Extubation in OR  Informed Consent: I have reviewed the patients History and Physical, chart, labs and discussed the procedure including the risks, benefits and alternatives for the proposed anesthesia with the patient or authorized representative who has indicated his/her understanding and acceptance.     Dental advisory given  Plan Discussed with: CRNA  Anesthesia Plan Comments: (Lab Results      Component                Value               Date                      WBC                      7.1                 05/06/2021                HGB                      13.6                05/06/2021                 HCT                      41.6                05/06/2021                MCV                      96.5                05/06/2021                PLT                      250  05/06/2021           Lab Results      Component                Value               Date                      NA                       138                 05/06/2021                K                        3.7                 05/06/2021                CO2                      30                  05/06/2021                GLUCOSE                  94                  05/06/2021                BUN                      19                  05/06/2021                CREATININE               1.13 (H)            05/06/2021                CALCIUM                  9.0                 05/06/2021                GFRNONAA                 56 (L)              05/06/2021          )       Anesthesia Quick Evaluation

## 2021-05-09 ENCOUNTER — Encounter (HOSPITAL_COMMUNITY): Payer: Self-pay | Admitting: Orthopaedic Surgery

## 2021-05-09 ENCOUNTER — Observation Stay (HOSPITAL_COMMUNITY)
Admission: RE | Admit: 2021-05-09 | Discharge: 2021-05-10 | Disposition: A | Discharge status: Home / Self Care | Payer: 59 | Attending: Orthopaedic Surgery | Admitting: Orthopaedic Surgery

## 2021-05-09 ENCOUNTER — Ambulatory Visit (HOSPITAL_COMMUNITY): Payer: 59

## 2021-05-09 ENCOUNTER — Other Ambulatory Visit: Payer: Self-pay

## 2021-05-09 ENCOUNTER — Ambulatory Visit (HOSPITAL_COMMUNITY): Payer: 59 | Admitting: Certified Registered"

## 2021-05-09 ENCOUNTER — Encounter (HOSPITAL_COMMUNITY)
Admission: RE | Disposition: A | Discharge status: Home / Self Care | Payer: Self-pay | Source: Home / Self Care | Attending: Orthopaedic Surgery

## 2021-05-09 DIAGNOSIS — M48062 Spinal stenosis, lumbar region with neurogenic claudication: Secondary | ICD-10-CM | POA: Diagnosis present

## 2021-05-09 DIAGNOSIS — M7138 Other bursal cyst, other site: Secondary | ICD-10-CM | POA: Diagnosis present

## 2021-05-09 DIAGNOSIS — I10 Essential (primary) hypertension: Secondary | ICD-10-CM | POA: Insufficient documentation

## 2021-05-09 DIAGNOSIS — M48061 Spinal stenosis, lumbar region without neurogenic claudication: Secondary | ICD-10-CM | POA: Diagnosis present

## 2021-05-09 DIAGNOSIS — Z79899 Other long term (current) drug therapy: Secondary | ICD-10-CM | POA: Diagnosis not present

## 2021-05-09 DIAGNOSIS — Z01818 Encounter for other preprocedural examination: Secondary | ICD-10-CM

## 2021-05-09 DIAGNOSIS — Z419 Encounter for procedure for purposes other than remedying health state, unspecified: Secondary | ICD-10-CM

## 2021-05-09 LAB — ABO/RH: ABO/RH(D): A POS

## 2021-05-09 SURGERY — POSTERIOR LUMBAR FUSION 1 LEVEL
Anesthesia: General

## 2021-05-09 MED ORDER — ACETAMINOPHEN 500 MG PO TABS
1000.0000 mg | ORAL_TABLET | Freq: Once | ORAL | Status: AC
  Administered 2021-05-09: 1000 mg via ORAL
  Filled 2021-05-09: qty 2

## 2021-05-09 MED ORDER — HYDROMORPHONE HCL 1 MG/ML IJ SOLN: 0.5000 mg | INTRAMUSCULAR | Status: DC | PRN

## 2021-05-09 MED ORDER — SUGAMMADEX SODIUM 200 MG/2ML IV SOLN
INTRAVENOUS | Status: DC | PRN
  Administered 2021-05-09: 300 mg via INTRAVENOUS

## 2021-05-09 MED ORDER — CHLORHEXIDINE GLUCONATE 0.12 % MT SOLN
15.0000 mL | Freq: Once | OROMUCOSAL | Status: AC
  Administered 2021-05-09: 15 mL via OROMUCOSAL
  Filled 2021-05-09: qty 15

## 2021-05-09 MED ORDER — CYANOCOBALAMIN 500 MCG SL SUBL: 500.0000 ug | SUBLINGUAL_TABLET | Freq: Every day | SUBLINGUAL | Status: DC

## 2021-05-09 MED ORDER — PROMETHAZINE HCL 25 MG/ML IJ SOLN: 6.2500 mg | INTRAMUSCULAR | Status: DC | PRN

## 2021-05-09 MED ORDER — CEFAZOLIN SODIUM-DEXTROSE 2-3 GM-%(50ML) IV SOLR
INTRAVENOUS | Status: DC | PRN
  Administered 2021-05-09: 3 g via INTRAVENOUS

## 2021-05-09 MED ORDER — GABAPENTIN 300 MG PO CAPS: 300.0000 mg | ORAL_CAPSULE | Freq: Every evening | ORAL | Status: DC | PRN

## 2021-05-09 MED ORDER — SODIUM CHLORIDE 0.9 % IV SOLN: 250.0000 mL | INTRAVENOUS | Status: DC

## 2021-05-09 MED ORDER — ONDANSETRON HCL 4 MG PO TABS: 4.0000 mg | ORAL_TABLET | Freq: Four times a day (QID) | ORAL | Status: DC | PRN

## 2021-05-09 MED ORDER — METHOCARBAMOL 500 MG PO TABS
500.0000 mg | ORAL_TABLET | Freq: Four times a day (QID) | ORAL | Status: DC | PRN
  Administered 2021-05-09 – 2021-05-10 (×3): 500 mg via ORAL
  Filled 2021-05-09 (×3): qty 1

## 2021-05-09 MED ORDER — FENTANYL CITRATE (PF) 100 MCG/2ML IJ SOLN: 25.0000 ug | INTRAMUSCULAR | Status: DC | PRN

## 2021-05-09 MED ORDER — HYDROXYZINE HCL 25 MG PO TABS: 50.0000 mg | ORAL_TABLET | Freq: Three times a day (TID) | ORAL | Status: DC | PRN

## 2021-05-09 MED ORDER — FENTANYL CITRATE (PF) 100 MCG/2ML IJ SOLN
INTRAMUSCULAR | Status: AC
  Filled 2021-05-09: qty 2

## 2021-05-09 MED ORDER — OXYCODONE-ACETAMINOPHEN 5-325 MG PO TABS: 1.0000 | ORAL_TABLET | ORAL | 0 refills | Status: DC | PRN

## 2021-05-09 MED ORDER — LORATADINE 10 MG PO TABS: 10.0000 mg | ORAL_TABLET | Freq: Every day | ORAL | Status: DC

## 2021-05-09 MED ORDER — OXYCODONE HCL 5 MG PO TABS
5.0000 mg | ORAL_TABLET | ORAL | Status: DC | PRN
  Administered 2021-05-09 – 2021-05-10 (×4): 10 mg via ORAL
  Filled 2021-05-09 (×4): qty 2

## 2021-05-09 MED ORDER — DEXAMETHASONE SODIUM PHOSPHATE 10 MG/ML IJ SOLN
INTRAMUSCULAR | Status: DC | PRN
  Administered 2021-05-09: 10 mg via INTRAVENOUS

## 2021-05-09 MED ORDER — MIDAZOLAM HCL 2 MG/2ML IJ SOLN
INTRAMUSCULAR | Status: DC | PRN
  Administered 2021-05-09: 2 mg via INTRAVENOUS

## 2021-05-09 MED ORDER — PROPRANOLOL HCL ER 80 MG PO CP24
80.0000 mg | ORAL_CAPSULE | Freq: Every day | ORAL | Status: DC
  Filled 2021-05-09: qty 1

## 2021-05-09 MED ORDER — THROMBIN (RECOMBINANT) 20000 UNITS EX SOLR
CUTANEOUS | Status: AC
  Filled 2021-05-09: qty 20000

## 2021-05-09 MED ORDER — MENTHOL 3 MG MT LOZG: 1.0000 | LOZENGE | OROMUCOSAL | Status: DC | PRN

## 2021-05-09 MED ORDER — SODIUM CHLORIDE 0.9% FLUSH: 3.0000 mL | INTRAVENOUS | Status: DC | PRN

## 2021-05-09 MED ORDER — ROCURONIUM BROMIDE 100 MG/10ML IV SOLN
INTRAVENOUS | Status: DC | PRN
  Administered 2021-05-09: 30 mg via INTRAVENOUS
  Administered 2021-05-09: 10 mg via INTRAVENOUS
  Administered 2021-05-09: 70 mg via INTRAVENOUS

## 2021-05-09 MED ORDER — HYDROMORPHONE HCL 1 MG/ML IJ SOLN
INTRAMUSCULAR | Status: AC
  Filled 2021-05-09: qty 0.5

## 2021-05-09 MED ORDER — HEMOSTATIC AGENTS (NO CHARGE) OPTIME: TOPICAL | Status: DC | PRN

## 2021-05-09 MED ORDER — VITAMIN D 25 MCG (1000 UNIT) PO TABS: 2000.0000 [IU] | ORAL_TABLET | Freq: Every day | ORAL | Status: DC

## 2021-05-09 MED ORDER — BUPIVACAINE HCL (PF) 0.5 % IJ SOLN
INTRAMUSCULAR | Status: DC | PRN
  Administered 2021-05-09: 10 mL

## 2021-05-09 MED ORDER — FENTANYL CITRATE (PF) 250 MCG/5ML IJ SOLN
INTRAMUSCULAR | Status: DC | PRN
  Administered 2021-05-09: 75 ug via INTRAVENOUS
  Administered 2021-05-09: 25 ug via INTRAVENOUS
  Administered 2021-05-09: 75 ug via INTRAVENOUS
  Administered 2021-05-09: 50 ug via INTRAVENOUS
  Administered 2021-05-09: 25 ug via INTRAVENOUS

## 2021-05-09 MED ORDER — CEFAZOLIN IN SODIUM CHLORIDE 3-0.9 GM/100ML-% IV SOLN
3.0000 g | INTRAVENOUS | Status: DC
  Filled 2021-05-09: qty 100

## 2021-05-09 MED ORDER — KETAMINE HCL 10 MG/ML IJ SOLN
INTRAMUSCULAR | Status: DC | PRN
  Administered 2021-05-09: 30 mg via INTRAVENOUS
  Administered 2021-05-09 (×2): 10 mg via INTRAVENOUS

## 2021-05-09 MED ORDER — FLUTICASONE PROPIONATE 50 MCG/ACT NA SUSP: 1.0000 | Freq: Two times a day (BID) | NASAL | Status: DC | PRN

## 2021-05-09 MED ORDER — FENTANYL CITRATE (PF) 250 MCG/5ML IJ SOLN
INTRAMUSCULAR | Status: AC
  Filled 2021-05-09: qty 5

## 2021-05-09 MED ORDER — 0.9 % SODIUM CHLORIDE (POUR BTL) OPTIME
TOPICAL | Status: DC | PRN
  Administered 2021-05-09: 1000 mL

## 2021-05-09 MED ORDER — POLYETHYLENE GLYCOL 3350 17 G PO PACK: 17.0000 g | PACK | Freq: Every day | ORAL | Status: DC | PRN

## 2021-05-09 MED ORDER — ACETAMINOPHEN 650 MG RE SUPP: 650.0000 mg | RECTAL | Status: DC | PRN

## 2021-05-09 MED ORDER — SODIUM CHLORIDE 0.9% FLUSH
3.0000 mL | Freq: Two times a day (BID) | INTRAVENOUS | Status: DC
  Administered 2021-05-09 (×2): 3 mL via INTRAVENOUS

## 2021-05-09 MED ORDER — MONTELUKAST SODIUM 10 MG PO TABS: 10.0000 mg | ORAL_TABLET | Freq: Every day | ORAL | Status: DC | PRN

## 2021-05-09 MED ORDER — PROPOFOL 10 MG/ML IV BOLUS
INTRAVENOUS | Status: DC | PRN
  Administered 2021-05-09: 150 mg via INTRAVENOUS

## 2021-05-09 MED ORDER — DOCUSATE SODIUM 100 MG PO CAPS
100.0000 mg | ORAL_CAPSULE | Freq: Two times a day (BID) | ORAL | Status: DC
  Administered 2021-05-09 (×2): 100 mg via ORAL
  Filled 2021-05-09 (×2): qty 1

## 2021-05-09 MED ORDER — PHENOL 1.4 % MT LIQD: 1.0000 | OROMUCOSAL | Status: DC | PRN

## 2021-05-09 MED ORDER — BUPIVACAINE HCL (PF) 0.5 % IJ SOLN
INTRAMUSCULAR | Status: AC
  Filled 2021-05-09: qty 30

## 2021-05-09 MED ORDER — ORAL CARE MOUTH RINSE: 15.0000 mL | Freq: Once | OROMUCOSAL | Status: AC

## 2021-05-09 MED ORDER — METHOCARBAMOL 500 MG PO TABS: 500.0000 mg | ORAL_TABLET | Freq: Four times a day (QID) | ORAL | 0 refills | Status: DC | PRN

## 2021-05-09 MED ORDER — ONDANSETRON HCL 4 MG/2ML IJ SOLN
4.0000 mg | Freq: Four times a day (QID) | INTRAMUSCULAR | Status: DC | PRN
  Administered 2021-05-09: 4 mg via INTRAVENOUS
  Filled 2021-05-09: qty 2

## 2021-05-09 MED ORDER — HEMOSTATIC AGENTS (NO CHARGE) OPTIME
TOPICAL | Status: DC | PRN
  Administered 2021-05-09: 1 via TOPICAL

## 2021-05-09 MED ORDER — METHOCARBAMOL 1000 MG/10ML IJ SOLN
500.0000 mg | Freq: Four times a day (QID) | INTRAVENOUS | Status: DC | PRN
  Filled 2021-05-09: qty 5

## 2021-05-09 MED ORDER — MIDAZOLAM HCL 2 MG/2ML IJ SOLN
INTRAMUSCULAR | Status: AC
  Filled 2021-05-09: qty 2

## 2021-05-09 MED ORDER — EPHEDRINE SULFATE-NACL 50-0.9 MG/10ML-% IV SOSY
PREFILLED_SYRINGE | INTRAVENOUS | Status: DC | PRN
  Administered 2021-05-09 (×2): 5 mg via INTRAVENOUS

## 2021-05-09 MED ORDER — VITAMIN B-12 1000 MCG PO TABS: 500.0000 ug | ORAL_TABLET | Freq: Every day | ORAL | Status: DC

## 2021-05-09 MED ORDER — ACETAMINOPHEN 325 MG PO TABS: 650.0000 mg | ORAL_TABLET | ORAL | Status: DC | PRN

## 2021-05-09 MED ORDER — ONDANSETRON HCL 4 MG/2ML IJ SOLN
INTRAMUSCULAR | Status: DC | PRN
  Administered 2021-05-09: 4 mg via INTRAVENOUS

## 2021-05-09 MED ORDER — KETAMINE HCL 50 MG/5ML IJ SOSY
PREFILLED_SYRINGE | INTRAMUSCULAR | Status: AC
  Filled 2021-05-09: qty 5

## 2021-05-09 MED ORDER — SODIUM CHLORIDE 0.9 % IV SOLN: INTRAVENOUS | Status: DC

## 2021-05-09 MED ORDER — PHENYLEPHRINE HCL (PRESSORS) 10 MG/ML IV SOLN
INTRAVENOUS | Status: DC | PRN
  Administered 2021-05-09 (×3): 80 ug via INTRAVENOUS

## 2021-05-09 MED ORDER — FENTANYL CITRATE (PF) 100 MCG/2ML IJ SOLN
25.0000 ug | INTRAMUSCULAR | Status: DC | PRN
  Administered 2021-05-09 (×2): 50 ug via INTRAVENOUS

## 2021-05-09 MED ORDER — LIDOCAINE HCL (CARDIAC) PF 100 MG/5ML IV SOSY
PREFILLED_SYRINGE | INTRAVENOUS | Status: DC | PRN
  Administered 2021-05-09: 60 mg via INTRATRACHEAL

## 2021-05-09 MED ORDER — DICYCLOMINE HCL 10 MG PO CAPS: 10.0000 mg | ORAL_CAPSULE | Freq: Two times a day (BID) | ORAL | Status: DC

## 2021-05-09 MED ORDER — HYDROMORPHONE HCL 1 MG/ML IJ SOLN
INTRAMUSCULAR | Status: DC | PRN
  Administered 2021-05-09: .5 mg via INTRAVENOUS

## 2021-05-09 MED ORDER — LEVOCETIRIZINE DIHYDROCHLORIDE 5 MG PO TABS: 5.0000 mg | ORAL_TABLET | Freq: Every day | ORAL | Status: DC

## 2021-05-09 MED ORDER — LACTATED RINGERS IV SOLN: INTRAVENOUS | Status: DC

## 2021-05-09 SURGICAL SUPPLY — 65 items
ADH SKN CLS APL DERMABOND .7 (GAUZE/BANDAGES/DRESSINGS) ×1
BAG COUNTER SPONGE SURGICOUNT (BAG) ×2 IMPLANT
BAG SURGICOUNT SPONGE COUNTING (BAG) ×1
BLADE CLIPPER SURG (BLADE) IMPLANT
BUR EGG ELITE 4.0 (BURR) ×1 IMPLANT
BUR EGG ELITE 4.0MM (BURR) ×1
BUR ROUND FLUTED 4 SOFT TCH (BURR) ×2 IMPLANT
BUR ROUND FLUTED 4MM SOFT TCH (BURR) ×1
CABLE BIPOLOR RESECTION CORD (MISCELLANEOUS) ×3 IMPLANT
CAP LOCKING THREADED (Cap) ×8 IMPLANT
COVER BACK TABLE 80X110 HD (DRAPES) ×3 IMPLANT
COVER SURGICAL LIGHT HANDLE (MISCELLANEOUS) ×3 IMPLANT
DERMABOND ADVANCED (GAUZE/BANDAGES/DRESSINGS) ×2
DERMABOND ADVANCED .7 DNX12 (GAUZE/BANDAGES/DRESSINGS) ×1 IMPLANT
DRAPE C-ARM 42X72 X-RAY (DRAPES) ×3 IMPLANT
DRAPE LAPAROTOMY T 102X78X121 (DRAPES) ×3 IMPLANT
DRAPE MICROSCOPE LEICA (MISCELLANEOUS) ×3 IMPLANT
DRAPE SURG 17X23 STRL (DRAPES) ×9 IMPLANT
DRSG MEPILEX BORDER 4X8 (GAUZE/BANDAGES/DRESSINGS) ×2 IMPLANT
DRSG PAD ABDOMINAL 8X10 ST (GAUZE/BANDAGES/DRESSINGS) ×6 IMPLANT
DURAPREP 26ML APPLICATOR (WOUND CARE) ×3 IMPLANT
ELECT BLADE 4.0 EZ CLEAN MEGAD (MISCELLANEOUS) ×3
ELECT CAUTERY BLADE 6.4 (BLADE) ×3 IMPLANT
ELECT REM PT RETURN 9FT ADLT (ELECTROSURGICAL) ×3
ELECTRODE BLDE 4.0 EZ CLN MEGD (MISCELLANEOUS) ×1 IMPLANT
ELECTRODE REM PT RTRN 9FT ADLT (ELECTROSURGICAL) ×1 IMPLANT
EVACUATOR 1/8 PVC DRAIN (DRAIN) ×3 IMPLANT
GLOVE SRG 8 PF TXTR STRL LF DI (GLOVE) ×2 IMPLANT
GLOVE SURG ORTHO LTX SZ7.5 (GLOVE) ×6 IMPLANT
GLOVE SURG UNDER POLY LF SZ8 (GLOVE) ×6
GOWN STRL REUS W/ TWL LRG LVL3 (GOWN DISPOSABLE) ×1 IMPLANT
GOWN STRL REUS W/ TWL XL LVL3 (GOWN DISPOSABLE) ×1 IMPLANT
GOWN STRL REUS W/TWL 2XL LVL3 (GOWN DISPOSABLE) ×3 IMPLANT
GOWN STRL REUS W/TWL LRG LVL3 (GOWN DISPOSABLE) ×3
GOWN STRL REUS W/TWL XL LVL3 (GOWN DISPOSABLE) ×3
HEMOSTAT SURGICEL 2X14 (HEMOSTASIS) IMPLANT
KIT BASIN OR (CUSTOM PROCEDURE TRAY) ×3 IMPLANT
KIT POSITION SURG JACKSON T1 (MISCELLANEOUS) IMPLANT
KIT TURNOVER KIT B (KITS) ×3 IMPLANT
MANIFOLD NEPTUNE II (INSTRUMENTS) ×3 IMPLANT
NS IRRIG 1000ML POUR BTL (IV SOLUTION) ×3 IMPLANT
PACK LAMINECTOMY ORTHO (CUSTOM PROCEDURE TRAY) ×3 IMPLANT
PAD ARMBOARD 7.5X6 YLW CONV (MISCELLANEOUS) ×6 IMPLANT
PATTIES SURGICAL .5 X.5 (GAUZE/BANDAGES/DRESSINGS) IMPLANT
PATTIES SURGICAL .75X.75 (GAUZE/BANDAGES/DRESSINGS) ×3 IMPLANT
ROD CREO 50MM (Rod) ×4 IMPLANT
SCREW POLY THRD CREO 6.5X40 (Screw) ×8 IMPLANT
SPACER TLIF SIGN 11X33X13 LG (Spacer) ×2 IMPLANT
SPONGE SURGIFOAM ABS GEL 100 (HEMOSTASIS) ×2 IMPLANT
SPONGE T-LAP 18X18 ~~LOC~~+RFID (SPONGE) ×4 IMPLANT
SPONGE T-LAP 4X18 ~~LOC~~+RFID (SPONGE) ×10 IMPLANT
STAPLER VISISTAT 35W (STAPLE) IMPLANT
SURGIFLO W/THROMBIN 8M KIT (HEMOSTASIS) ×3 IMPLANT
SUT VIC AB 0 CT1 27 (SUTURE) ×6
SUT VIC AB 0 CT1 27XBRD ANBCTR (SUTURE) IMPLANT
SUT VIC AB 1 CTX 36 (SUTURE) ×15
SUT VIC AB 1 CTX36XBRD ANBCTR (SUTURE) IMPLANT
SUT VIC AB 1 CTX36XBRD ANBCTRL (SUTURE) IMPLANT
SUT VIC AB 2-0 CT1 27 (SUTURE) ×3
SUT VIC AB 2-0 CT1 TAPERPNT 27 (SUTURE) IMPLANT
TAP SURG AMP CREO 5.5 (TAP) ×2 IMPLANT
TOWEL GREEN STERILE (TOWEL DISPOSABLE) ×3 IMPLANT
TOWEL GREEN STERILE FF (TOWEL DISPOSABLE) ×3 IMPLANT
TRAY FOLEY MTR SLVR 16FR STAT (SET/KITS/TRAYS/PACK) ×3 IMPLANT
YANKAUER SUCT BULB TIP NO VENT (SUCTIONS) ×3 IMPLANT

## 2021-05-09 NOTE — Progress Notes (Signed)
Orthopedic Tech Progress Note Patient Details:  [name] [name] Oct 20, 1960 [id number]  Ortho Devices Type of Ortho Device: Lumbar corsett Ortho Device/Splint Interventions: Ordered     LSO dropped off in patients room on 3C.  [name] 05/09/2021, 3:50 PM

## 2021-05-09 NOTE — Interval H&P Note (Signed)
History and Physical Interval Note:  05/09/2021 7:21 AM  Ashley [name]  has presented today for surgery, with the diagnosis of L4-5 stenosis, claudication, intraspinal extradural cyst.  The various methods of treatment have been discussed with the patient and family. After consideration of risks, benefits and other options for treatment, the patient has consented to  Procedure(s): L4-5 EXCISION OF INTRASPINAL EXTRADURAL FACET CYST, RIGHT TRANSFORAMINAL LUMBAR INTERBODY FUSION, PEDICLE INSTRUMENTATION, CAGE (N/A) as a surgical intervention.  The patient's history has been reviewed, patient examined, no change in status, stable for surgery.  I have reviewed the patient's chart and labs.  Questions were answered to the patient's satisfaction.     Eldred Manges

## 2021-05-09 NOTE — Op Note (Signed)
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  Procedure: Microscope assisted excision of intraspinal extradural facet cyst at L4-5 level.  Right L4-5 transforaminal interbody fusion with cage and local bone, pedicle instrumentation single level L4-5 and bilateral intertransverse process fusion.  (360 fusion)  Surgeon: Annell Greening, MD  Assistant: Zonia Kief, PA-C medically necessary and present for the entire procedure after initial skin incision.  Instrumental and instrumentation and neural element protection, cage placement screw placements.  Procedure: After induction general anesthesia orotracheal ovation Foley catheter placement patient was placed prone on chest rolls.  Patient had a intraspinal extradural facet cyst causing spinal stenosis with neurogenic claudication symptoms with weakness giving way and more right than left leg symptoms although bilateral lower extremity weakness with standing and walking.  Area squared to the 1015 drape below back was prepped with DuraPrep there is squared with towels sterile skin marker Betadine Steri-Drape and laminectomy sheets and drapes.  After timeout procedure with 3 g Ancef due to the patient's BMI IV TXA midline incision subperiosteal dissection down to the lamina.  Kocher clamps were placed and sterilely draped C-arm was used laterally for visualization.  Somewhat difficult visualization due to body habitus and BMI greater than 40.  Once Kocher's were appropriately placed sterile skin marker was used on the bone and decompression was performed performing a complete laminectomy at L4.  We extended on the right side out to the facet cyst down to the pedicle at L5 and then confirmation of the appropriate disc base with the Penfield 4 sitting on top of the L4-5 disc base on the right.  Using the operative microscope the facet cyst was peeled off the dura portion of it was  adherent and had to be sharply resected with pickups and 15 blade scalpel.  Once it was peeled and separated completely good decompression disc was cleaned out with the Derica protecting the dura.  Nerve root above was free and we progressed up to #11 scraper.  Angled curettes straight curettes ring curettes rasps pituitaries were used for preparation of the disc anterior annulus was intact and once the disc was cleaned out all the bone that had been removed both for the facet on the right side as well as the laminectomy was meticulously cleaned of soft tissue by the scrub tech and morselized in the small pieces of bone.  These were meticulously packed after trial sizers showed that the 13 cage gave nice fit restoration of disc space height and was snug with twisting and slight pulling.  Bone was packed and followed by bone packed into the cage and then into insertion of the cage in the normal fashion countersinking it was cross even with the midline and countersunk a few millimeters with bone packed anterior to the cage visualized on C arm.  We try to advance it a few more millimeters and it was snug would not advance.  Pedicle screws were then placed right side first followed by left starting with the all checking under C arm hand pushing the joystick down the pedicle feeling the medial wall of the pedicle anterior wall of the pedicle with either the hockey-stick or the Wissinger.  Care was taken make sure the medial wall of the pedicle inferior wall was not violated and after joystick feeling with the pedicle feeler followed by tapping feeling with the pedicle feeler again with the tap and we felt that medial and inferior wall to make sure  directly down the pedicle there was medialization and convergence.  Decortication of the transverse process had been exposed with a 4 mm bur followed by placement of the screw and then final palpation inside the canal to make sure that no portion of the screw exited through any  portion of the pedicle.  Operative microscope was used for visualization of this as well.  2 screws were placed on the opposite side starting at the junction of the base of the transverse process.  All 4 screws are in good position final spot images were taken after 50 mm rods were placed tightened down securely clicking down the right side first where the cage had been placed follow-up with a left side with good compression on both sides entire apparatus was tight no bone was left.  Some Surgi-Flo was used in the epidural space palpation of both sides make sure there was good decompression on all sides dura was around tube.  Bone graft was packed meticulously right and left gutters.  This was from 1 transverse process to the other and then copious irrigation closure of the deep fascia with Vicryl suture still in subtenons tissue skin staple closure postop dressing and transfer the care room.  Implants  ROD CREO - [id number]  Inventory Item: ROD CREO Serial no.:  Model/Cat no.: [id number]  Implant name: ROD CREO - [id number] Laterality: N/A Area: Lumbar Level 4-5  Manufacturer: [employer] Date of Manufacture:    Action: Implanted Number Used: 2   Device Identifier:  Device Identifier Type:     CAP LOCKING THREADED - [id number]  Inventory Item: CAP LOCKING THREADED Serial no.:  Model/Cat no.: [id number]  Implant name: CAP LOCKING THREADED - [id number] Laterality: N/A Area: Lumbar Level 4-5  Manufacturer: [employer] Date of Manufacture:    Action: Implanted Number Used: 4   Device Identifier:  Device Identifier Type:     SCREW POLY THRD CREO 6.5X40 - [id number]  Inventory Item: SCREW POLY THRD CREO 6.5X40 Serial no.:  Model/Cat no.: [id number]  Implant name: Cristal Ford THRD CREO 6.5X40 - [id number] Laterality: N/A Area: Lumbar Level 4-5  Manufacturer: [employer] Date of Manufacture:    Action: Implanted Number Used: 4   Device Identifier:  Device Identifier Type:      signature tlif spacer large 79mm  Inventory Item:  Serial no.:  Model/Cat no.: [id number]  Implant name: signature tlif spacer large 70mm Laterality: N/A Area: Lumbar Level 4-5  Manufacturer: [employer] Date of Manufacture:    Action: Implanted Number Used: 1   Device Identifier:  Device Identifier Type:

## 2021-05-09 NOTE — Transfer of Care (Signed)
Immediate Anesthesia Transfer of Care Note  Patient: Ashley [name]  Procedure(s) Performed: L4-5 EXCISION OF INTRASPINAL EXTRADURAL FACET CYST, RIGHT TRANSFORAMINAL LUMBAR INTERBODY FUSION, PEDICLE INSTRUMENTATION, CAGE  Patient Location: PACU  Anesthesia Type:General  Level of Consciousness: awake, alert , oriented and patient cooperative  Airway & Oxygen Therapy: Patient Spontanous Breathing  Post-op Assessment: Report given to RN, Post -op Vital signs reviewed and stable and Patient moving all extremities X 4  Post vital signs: Reviewed and stable  Last Vitals:  Vitals Value Taken Time  BP 145/95 05/09/21 1140  Temp    Pulse 77 05/09/21 1140  Resp 17 05/09/21 1140  SpO2 100 % 05/09/21 1140    Last Pain:  Vitals:   05/09/21 0603  TempSrc: Oral         Complications: No notable events documented.

## 2021-05-09 NOTE — Anesthesia Procedure Notes (Signed)
Procedure Name: Intubation Date/Time: 05/09/2021 7:42 AM Performed by: Stanton Kidney, CRNA Pre-anesthesia Checklist: Patient identified, Emergency Drugs available, Suction available and Patient being monitored Patient Re-evaluated:Patient Re-evaluated prior to induction Oxygen Delivery Method: Circle system utilized Preoxygenation: Pre-oxygenation with 100% oxygen Induction Type: IV induction Ventilation: Mask ventilation without difficulty Laryngoscope Size: Miller and 2 Grade View: Grade I Tube type: Oral Tube size: 7.5 mm Number of attempts: 1 Airway Equipment and Method: Stylet and Oral airway Placement Confirmation: ETT inserted through vocal cords under direct vision, positive ETCO2 and breath sounds checked- equal and bilateral Secured at: 23 cm Tube secured with: Tape Dental Injury: Teeth and Oropharynx as per pre-operative assessment

## 2021-05-09 NOTE — Discharge Instructions (Addendum)
Ok to shower 1 days postop.  Do not apply any creams or ointments to incision.  Can use 4x4 gauze and tape for dressing changes if your dressing comes off.  Your dressing is water proof and should last until your office visit with Dr. Ophelia Charter in one week.  No aggressive activity.  No lifting, pushing,twisting, bending, squatting or prolonged sitting.  Mostly be in reclined position or lying down.    No driving.   Must wear brace at all times when up and ambulating. Walk daily gradually increasing your distance.

## 2021-05-09 NOTE — Anesthesia Postprocedure Evaluation (Signed)
Anesthesia Post Note  Patient: Ashley [name]  Procedure(s) Performed: L4-5 EXCISION OF INTRASPINAL EXTRADURAL FACET CYST, RIGHT TRANSFORAMINAL LUMBAR INTERBODY FUSION, PEDICLE INSTRUMENTATION, CAGE     Patient location during evaluation: PACU Anesthesia Type: General Level of consciousness: awake and alert Pain management: pain level controlled Vital Signs Assessment: post-procedure vital signs reviewed and stable Respiratory status: spontaneous breathing, nonlabored ventilation, respiratory function stable and patient connected to nasal cannula oxygen Cardiovascular status: blood pressure returned to baseline and stable Postop Assessment: no apparent nausea or vomiting Anesthetic complications: no   No notable events documented.  Last Vitals:  Vitals:   05/09/21 1310 05/09/21 1334  BP: 128/75 132/87  Pulse: 61 (!) 59  Resp: 20 18  Temp: (!) 36.1 C 36.5 C  SpO2: 100% 100%    Last Pain:  Vitals:   05/09/21 1334  TempSrc: Oral  PainSc: 3     LLE Motor Response: Purposeful movement (05/09/21 1340) LLE Sensation: Full sensation (05/09/21 1340) RLE Motor Response: Purposeful movement (05/09/21 1340) RLE Sensation: Full sensation (05/09/21 1340)      [name]

## 2021-05-09 NOTE — H&P (Signed)
Ashley [name] is an 60 y.o. female.   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  States that symptoms unchanged from previous visit and she is wanting to proceed with L4-5 EXCISION OF INTRASPINAL EXTRADURAL FACET CYST, RIGHT TRANSFORAMINAL LUMBAR INTERBODY FUSION, PEDICLE INSTRUMENTATION, CAGE.  Today history and physical performed.  Review of systems negative.      Past Medical History:  Diagnosis Date   Essential tremor    GERD (gastroesophageal reflux disease)    History of cholecystectomy    Hypertension    Neuropathy    Palpitations    Seasonal allergies    Sinus infection     Past Surgical History:  Procedure Laterality Date   CARPAL TUNNEL RELEASE Right 2005   left 2011   CESAREAN SECTION  1989   CHOLECYSTECTOMY  2008   CORRECTION OF HAMMER TOE REPAIR AND BUNION CORRECTION  2018   both feet   Hammer Toe Repair Bilateral 09/11/2016   #2 Toe Bilateral    HEEL SPUR SURGERY Left    history of neuroplasty decompression median nerve at carpal tunnel     LAPAROSCOPIC GASTRIC SLEEVE RESECTION WITH HIATAL HERNIA REPAIR  04/09/2015   Procedure: LAPAROSCOPIC GASTRIC SLEEVE RESECTION WITH HIATAL HERNIA REPAIR;  Surgeon: Glenna Fellows, MD;  Location: [facility];  Service: General;;   thumb joint replacement Bilateral    R- 03/02/17 & L- 07/21/16   UPPER GI ENDOSCOPY  04/09/2015   Procedure: UPPER GI ENDOSCOPY;  Surgeon: Glenna Fellows, MD;  Location: [facility];  Service: General;;    Family History  Problem Relation Age of Onset   Colon cancer Neg Hx    Esophageal cancer Neg Hx    Rectal cancer Neg Hx    Stomach cancer Neg Hx    Social History:  reports that she has never smoked. She has never used smokeless tobacco. She reports that she does not drink alcohol and does not use drugs.  Allergies:  Allergies  Allergen Reactions   Azithromycin Itching    Chocolate Hives   Mobic [Meloxicam]     Emotional, crying.   Neomycin Hives   Neosporin [Neomycin-Bacitracin Zn-Polymyx] Hives   Strawberry Extract Hives   Erythromycin Hives    Medications Prior to Admission  Medication Sig Dispense Refill   CALCIUM CITRATE PO Take 1 tablet by mouth daily.     Cholecalciferol (VITAMIN D) 50 MCG (2000 UT) tablet Take 2,000 Units by mouth daily.     Cyanocobalamin 500 MCG SUBL Place 500 mcg under the tongue daily.     dicyclomine (BENTYL) 10 MG capsule Take 10 mg by mouth in the morning and at bedtime.     flintstones complete (FLINTSTONES) 60 MG chewable tablet Chew 1 tablet by mouth daily.     fluticasone (FLONASE) 50 MCG/ACT nasal spray Place 1 spray into the nose 2 (two) times daily.      gabapentin (NEURONTIN) 300 MG capsule TAKE 1 CAPSULE BY MOUTH AT  BEDTIME (Patient taking differently: Take 300 mg by mouth at bedtime as needed (pain).) 90 capsule 0   hydrOXYzine (ATARAX/VISTARIL) 50 MG tablet Take 50 mg by mouth 3 (three) times daily as needed for itching.     levocetirizine (XYZAL) 5 MG tablet Take 5 mg by mouth daily.     montelukast (SINGULAIR) 10 MG tablet Take 10 mg by mouth daily as needed (allergies).     Multiple Vitamins-Minerals (  EQ VISION FORMULA 50+) CAPS Take 1 capsule by mouth daily.     propranolol ER (INDERAL LA) 80 MG 24 hr capsule TAKE 1 CAPSULE (80 MG TOTAL) BY MOUTH DAILY. 90 capsule 4   cephALEXin (KEFLEX) 500 MG capsule Take 1 capsule (500 mg total) by mouth 3 (three) times daily. (Patient not taking: Reported on 05/01/2021) 30 capsule 0   ondansetron (ZOFRAN) 4 MG tablet Take 1 tablet (4 mg total) by mouth every 8 (eight) hours as needed for nausea or vomiting. (Patient not taking: Reported on 05/01/2021) 20 tablet 0   ondansetron (ZOFRAN) 4 MG tablet Take 1 tablet (4 mg total) by mouth every 8 (eight) hours as needed. (Patient not taking: Reported on 05/01/2021) 20 tablet 0    No results found for this or any previous visit  (from the past 48 hour(s)). No results found.  Review of Systems  Constitutional:  Positive for activity change.  HENT: Negative.    Respiratory: Negative.    Cardiovascular: Negative.   Gastrointestinal: Negative.   Genitourinary: Negative.   Musculoskeletal:  Positive for back pain.  Neurological:  Positive for numbness.  Psychiatric/Behavioral:  Positive for decreased concentration.   All other systems reviewed and are negative.  Blood pressure (!) 150/60, pulse 65, temperature 98.1 F (36.7 C), temperature source Oral, resp. rate 18, height 5\' 7"  (1.702 m), weight 121.1 kg, SpO2 98 %. Physical Exam HENT:     Head: Normocephalic.     Nose: Nose normal.  Eyes:     Extraocular Movements: Extraocular movements intact.  Cardiovascular:     Rate and Rhythm: Regular rhythm.     Heart sounds: Normal heart sounds.  Pulmonary:     Effort: Pulmonary effort is normal. No respiratory distress.     Breath sounds: Normal breath sounds.  Abdominal:     General: Bowel sounds are normal.  Musculoskeletal:        General: Tenderness present.     Cervical back: Normal range of motion.  Neurological:     Mental Status: She is alert and oriented to person, place, and time.  Psychiatric:        Mood and Affect: Mood normal.     Assessment/Plan L4-5 HNP/stenosis  We will proceed with surgery as scheduled. Surgical procedure discussed along with potential rehab/recovery time.  All questions answered and she wishes to proceed.   , PA-C 05/09/2021, 6:46 AM

## 2021-05-10 DIAGNOSIS — M48062 Spinal stenosis, lumbar region with neurogenic claudication: Secondary | ICD-10-CM | POA: Diagnosis not present

## 2021-05-10 LAB — CBC
HCT: 33.6 % — ABNORMAL LOW (ref 36.0–46.0)
Hemoglobin: 11.4 g/dL — ABNORMAL LOW (ref 12.0–15.0)
MCH: 32.3 pg (ref 26.0–34.0)
MCHC: 33.9 g/dL (ref 30.0–36.0)
MCV: 95.2 fL (ref 80.0–100.0)
Platelets: 209 10*3/uL (ref 150–400)
RBC: 3.53 MIL/uL — ABNORMAL LOW (ref 3.87–5.11)
RDW: 13 % (ref 11.5–15.5)
WBC: 12.7 10*3/uL — ABNORMAL HIGH (ref 4.0–10.5)
nRBC: 0 % (ref 0.0–0.2)

## 2021-05-10 LAB — BASIC METABOLIC PANEL
Anion gap: 9 (ref 5–15)
BUN: 17 mg/dL (ref 6–20)
CO2: 24 mmol/L (ref 22–32)
Calcium: 8.5 mg/dL — ABNORMAL LOW (ref 8.9–10.3)
Chloride: 104 mmol/L (ref 98–111)
Creatinine, Ser: 1.05 mg/dL — ABNORMAL HIGH (ref 0.44–1.00)
GFR, Estimated: >60 mL/min (ref >60)
Glucose, Bld: 122 mg/dL — ABNORMAL HIGH (ref 70–99)
Potassium: 4.5 mmol/L (ref 3.5–5.1)
Sodium: 137 mmol/L (ref 135–145)

## 2021-05-10 NOTE — Plan of Care (Signed)

## 2021-05-10 NOTE — Progress Notes (Signed)
Patient ID: Ashley [name], female   DOB: 1960/07/10, 59 y.o.   MRN: [id number]   Subjective: 1 Day Post-Op Procedure(s) (LRB): L4-5 EXCISION OF INTRASPINAL EXTRADURAL FACET CYST, RIGHT TRANSFORAMINAL LUMBAR INTERBODY FUSION, PEDICLE INSTRUMENTATION, CAGE (N/A) Patient reports pain as moderate.    Objective: Vital signs in last 24 hours: Temp:  [96.2 F (35.7 C)-98.8 F (37.1 C)] 98.8 F (37.1 C) (12/17 0813) Pulse Rate:  [59-84] 84 (12/17 0813) Resp:  [8-20] 20 (12/17 0813) BP: (103-153)/(49-95) 142/51 (12/17 0813) SpO2:  [89 %-100 %] 97 % (12/17 0813)  Intake/Output from previous day: 12/16 0701 - 12/17 0700 In: 2725 [I.V.:2650; IV Piggyback:75] Out: 410 [Urine:230; Blood:180] Intake/Output this shift: No intake/output data recorded.  Recent Labs    05/10/21 0555  HGB 11.4*   Recent Labs    05/10/21 0555  WBC 12.7*  RBC 3.53*  HCT 33.6*  PLT 209   Recent Labs    05/10/21 0555  NA 137  K 4.5  CL 104  CO2 24  BUN 17  CREATININE 1.05*  GLUCOSE 122*  CALCIUM 8.5*   No results for input(s): LABPT, INR in the last 72 hours.  Neurologically intact DG Lumbar Spine 2-3 Views  Result Date: 05/09/2021 CLINICAL DATA:  L4-5 fusion EXAM: LUMBAR SPINE - 2-3 VIEW; DG C-ARM 1-60 MIN-NO REPORT COMPARISON:  03/18/2021 FINDINGS: C-arm images lumbar spine demonstrate bilateral pedicle screw and interbody fusion in the lower lumbar spine. This appears to be L4-5. Limited bony detail. IMPRESSION: PLIF lower lumbar spine appears to be L4-5. Follow-up radiographs recommended to confirm surgical level. Electronically Signed   By: Marlan Palau M.D.   On: 05/09/2021 14:01   DG C-Arm 1-60 Min-No Report  Result Date: 05/09/2021 CLINICAL DATA:  L4-5 fusion EXAM: LUMBAR SPINE - 2-3 VIEW; DG C-ARM 1-60 MIN-NO REPORT COMPARISON:  03/18/2021 FINDINGS: C-arm images lumbar spine demonstrate bilateral pedicle screw and interbody fusion in the lower lumbar spine. This appears to be L4-5.  Limited bony detail. IMPRESSION: PLIF lower lumbar spine appears to be L4-5. Follow-up radiographs recommended to confirm surgical level. Electronically Signed   By: Marlan Palau M.D.   On: 05/09/2021 14:01   DG C-Arm 1-60 Min-No Report  Result Date: 05/09/2021 CLINICAL DATA:  L4-5 fusion EXAM: LUMBAR SPINE - 2-3 VIEW; DG C-ARM 1-60 MIN-NO REPORT COMPARISON:  03/18/2021 FINDINGS: C-arm images lumbar spine demonstrate bilateral pedicle screw and interbody fusion in the lower lumbar spine. This appears to be L4-5. Limited bony detail. IMPRESSION: PLIF lower lumbar spine appears to be L4-5. Follow-up radiographs recommended to confirm surgical level. Electronically Signed   By: Marlan Palau M.D.   On: 05/09/2021 14:01   DG C-Arm 1-60 Min-No Report  Result Date: 05/09/2021 Fluoroscopy was utilized by the requesting physician.  No radiographic interpretation.    Assessment/Plan: 1 Day Post-Op Procedure(s) (LRB): L4-5 EXCISION OF INTRASPINAL EXTRADURAL FACET CYST, RIGHT TRANSFORAMINAL LUMBAR INTERBODY FUSION, PEDICLE INSTRUMENTATION, CAGE (N/A) Plan: discharge home. Office one week.   Eldred Manges 05/10/2021, 8:50 AM

## 2021-05-10 NOTE — Evaluation (Signed)
Physical Therapy Evaluation Patient Details Name: Ashley [name] MRN: [id number] DOB: 01/29/1961 Today's Date: 05/10/2021  History of Present Illness  60 y.o. female presents to [facility] 05/09/2021 with low back pain radiating into LE. Pt underwent excision of intraspinal extradural facet cyst at L4-5, along with R L4-5 TLIF. PMH includes essential tremor, HTN.  Clinical Impression  Pt presents to PT with deficits in activity tolerance, power, strength, balance, gait. Pt reports feelings of imbalance when ambulating, PT notes reduced stance time on RLE, likely due to discomfort in LE. Pt will benefit from continued acute PT services to improve activity tolerance and to aide in a return to independent mobility. Pt is currently mobilizing without physical assistance and has assistance from son available. PT recommends discharge home with outpatient PT services.       Recommendations for follow up therapy are one component of a multi-disciplinary discharge planning process, led by the attending physician.  Recommendations may be updated based on patient status, additional functional criteria and insurance authorization.  Follow Up Recommendations Outpatient PT    Assistance Recommended at Discharge Intermittent Supervision/Assistance  Functional Status Assessment Patient has had a recent decline in their functional status and demonstrates the ability to make significant improvements in function in a reasonable and predictable amount of time.  Equipment Recommendations  None recommended by PT    Recommendations for Other Services       Precautions / Restrictions Precautions Precautions: Back Precaution Booklet Issued: Yes (comment) Required Braces or Orthoses: Spinal Brace Spinal Brace: Lumbar corset;Applied in sitting position Restrictions Weight Bearing Restrictions: No      Mobility  Bed Mobility               General bed mobility comments: pt received sitting in  recliner    Transfers Overall transfer level: Needs assistance Equipment used: None Transfers: Sit to/from Stand Sit to Stand: Supervision Stand pivot transfers: Min guard         General transfer comment: cues for positioning and weight shifting    Ambulation/Gait Ambulation/Gait assistance: Supervision Gait Distance (Feet): 200 Feet (3 brief standing rest breaks due to fatigue and LE pain) Assistive device: None Gait Pattern/deviations: Step-through pattern Gait velocity: reduced Gait velocity interpretation: 1.31 - 2.62 ft/sec, indicative of limited community ambulator   General Gait Details: pt with reduced stance time on RLE  Stairs Stairs: Yes Stairs assistance: Supervision Stair Management: Two rails;Step to pattern Number of Stairs: 5    Wheelchair Mobility    Modified Rankin (Stroke Patients Only)       Balance Overall balance assessment: Needs assistance Sitting-balance support: No upper extremity supported;Feet supported Sitting balance-Leahy Scale: Good     Standing balance support: During functional activity;No upper extremity supported Standing balance-Leahy Scale: Fair                               Pertinent Vitals/Pain Pain Assessment: 0-10 Pain Score: 6  Pain Location: low back Pain Descriptors / Indicators: Aching Pain Intervention(s): Monitored during session    Home Living Family/patient expects to be discharged to:: Private residence Living Arrangements: Children Available Help at Discharge: Family;Available 24 hours/day Type of Home: Mobile home Home Access: Stairs to enter Entrance Stairs-Rails: Can reach both Entrance Stairs-Number of Steps: 5   Home Layout: One level Home Equipment: Shower seat Additional Comments: pt endorsing son does not work so will be available for 24hr S/A for IADL/ADLs at needed  Prior Function Prior Level of Function : Independent/Modified Independent             Mobility  Comments: indep without DME ADLs Comments: indep all ADLs     Hand Dominance   Dominant Hand: Right    Extremity/Trunk Assessment   Upper Extremity Assessment Upper Extremity Assessment: Overall WFL for tasks assessed    Lower Extremity Assessment Lower Extremity Assessment: Generalized weakness    Cervical / Trunk Assessment Cervical / Trunk Assessment: Back Surgery  Communication   Communication: No difficulties  Cognition Arousal/Alertness: Awake/alert Behavior During Therapy: WFL for tasks assessed/performed;Anxious Overall Cognitive Status: Within Functional Limits for tasks assessed                                 General Comments: pt endorsing feeling 'anxious' about going home        General Comments General comments (skin integrity, edema, etc.): VSS on RA    Exercises     Assessment/Plan    PT Assessment Patient needs continued PT services  PT Problem List Decreased strength;Decreased activity tolerance;Decreased balance;Decreased mobility;Pain       PT Treatment Interventions DME instruction;Gait training;Stair training;Functional mobility training;Therapeutic activities;Therapeutic exercise;Balance training;Neuromuscular re-education;Patient/family education    PT Goals (Current goals can be found in the Care Plan section)  Acute Rehab PT Goals Patient Stated Goal: to return to independent mobility PT Goal Formulation: With patient Time For Goal Achievement: 05/24/21 Potential to Achieve Goals: Good    Frequency Min 5X/week   Barriers to discharge        Co-evaluation               AM-PAC PT "6 Clicks" Mobility  Outcome Measure Help needed turning from your back to your side while in a flat bed without using bedrails?: A Little Help needed moving from lying on your back to sitting on the side of a flat bed without using bedrails?: A Little Help needed moving to and from a bed to a chair (including a wheelchair)?: A  Little Help needed standing up from a chair using your arms (e.g., wheelchair or bedside chair)?: A Little Help needed to walk in hospital room?: A Little Help needed climbing 3-5 steps with a railing? : A Little 6 Click Score: 18    End of Session Equipment Utilized During Treatment: Back brace Activity Tolerance: Patient tolerated treatment well Patient left: in chair;with call bell/phone within reach Nurse Communication: Mobility status PT Visit Diagnosis: Other abnormalities of gait and mobility (R26.89);Muscle weakness (generalized) (M62.81);Pain Pain - part of body: Leg    Time: 2542-7062 PT Time Calculation (min) (ACUTE ONLY): 21 min   Charges:   PT Evaluation $PT Eval Low Complexity: 1 Low          Arlyss Gandy, PT, DPT Acute Rehabilitation Pager: [phone] Office [phone]   Arlyss Gandy 05/10/2021, 9:48 AM

## 2021-05-10 NOTE — Progress Notes (Signed)
Patient awaiting transport via wheelchair by NT for discharge home; in no acute distress nor complaints of pain nor discomfort; incision on her back with honeycomb dressing and is clean, dry and intact; room was checked and accounted for all her belongings; discharge instructions concerning her medications, incision care, follow up appointment and when to call the doctor as needed were all discussed with patient by RN and she verbalized understanding on the instructions given.  °  °  °  ° ° ° ° °

## 2021-05-10 NOTE — Evaluation (Signed)
Occupational Therapy Evaluation Patient Details Name: Ashley [name] MRN: [id number] DOB: 28-Mar-1961 Today's Date: 05/10/2021   History of Present Illness 60 y.o. female presents to [facility] 05/09/2021 with low back pain radiating into LE. Pt underwent excision of intraspinal extradural facet cyst at L4-5, along with R L4-5 TLIF. PMH includes essential tremor, HTN.   Clinical Impression   Pt pleasant and motivated for participation with OT services. Endorses living at home with son who is available for 24hr S/A if needed, currently reporting 8/10 pain throughout session but received pain medication prior to OT arrival. Currently pt requiring Min guard for functional transfers and short bouts of in room mobility with cues for posture, min A for ADLs and brace management. Was  educated on and trialed use of long handled tools and additional equip to maximize indep and safety with plan for d/c to home with son. OT will follow for 1 more session if pt remains in hospital for additional training, but anticipate pt ot be able to return to home with son, with recommendation of hip kit, BSC and possible HHOT if agreeable.      Recommendations for follow up therapy are one component of a multi-disciplinary discharge planning process, led by the attending physician.  Recommendations may be updated based on patient status, additional functional criteria and insurance authorization.   Follow Up Recommendations  Home health OT    Assistance Recommended at Discharge Frequent or constant Supervision/Assistance  Functional Status Assessment  Patient has had a recent decline in their functional status and demonstrates the ability to make significant improvements in function in a reasonable and predictable amount of time.  Equipment Recommendations  BSC/3in1;Other (comment) (Hip kit)    Recommendations for Other Services       Precautions / Restrictions Precautions Precautions: Back Precaution Booklet  Issued: Yes (comment) Required Braces or Orthoses: Spinal Brace Spinal Brace: Lumbar corset;Applied in sitting position Restrictions Weight Bearing Restrictions: No      Mobility Bed Mobility               General bed mobility comments: pt recieved sitting EOB and declining transition into and out of bed at this time. OT completed education on log roll technique and positioning for pain management.    Transfers Overall transfer level: Needs assistance Equipment used: 1 person hand held assist Transfers: Sit to/from Stand;Bed to chair/wheelchair/BSC Sit to Stand: Min guard Stand pivot transfers: Min guard         General transfer comment: cues for positioning and weight shifting      Balance                                           ADL either performed or assessed with clinical judgement   ADL Overall ADL's : Needs assistance/impaired Eating/Feeding: Set up;Sitting   Grooming: Supervision/safety;Sitting;Standing Grooming Details (indicate cue type and reason): limited tolerance d/t mild dizziness with standing, but when simulated at table pt demo's ability to complete standing grooming tasks with SBA, improved after set up to sitting at EOB Upper Body Bathing: Set up;Sitting   Lower Body Bathing: Minimal assistance;Sit to/from stand Lower Body Bathing Details (indicate cue type and reason): improved to Sup with simulation of long handled sponge Upper Body Dressing : Min guard;Sitting Upper Body Dressing Details (indicate cue type and reason): jacket and brace mangaement Lower Body Dressing: Minimal assistance;With  adaptive equipment;Sit to/from stand   Toilet Transfer: Min guard;Cueing for Office manager Details (indicate cue type and reason): hand held assist provided Toileting- Water quality scientist and Hygiene: Supervision/safety;Sit to/from stand   Tub/ Shower Transfer: Minimal Museum/gallery conservator Details (indicate cue  type and reason): simulated with high stepping/step over activity at EOB with B UE supported on EOB. Functional mobility during ADLs: Min guard;Cueing for safety General ADL Comments: Pt with intermittent dizziness throughout session, unable to obtain BP but RN reports having just provided pain medication before therapist arrival. dizziness cleared quickly with seated rest break. pt demo's grossly requiring intermittent min A for ADLs limited by discomfort this date     Vision Baseline Vision/History: 0 No visual deficits Ability to See in Adequate Light: 0 Adequate       Perception     Praxis      Pertinent Vitals/Pain Pain Assessment: 0-10 Pain Score: 8  Pain Location: incision, lumbar region Pain Descriptors / Indicators: Aching;Discomfort;Guarding Pain Intervention(s): Limited activity within patient's tolerance;Premedicated before session;Relaxation;Monitored during session     Hand Dominance Right   Extremity/Trunk Assessment Upper Extremity Assessment Upper Extremity Assessment: Overall WFL for tasks assessed   Lower Extremity Assessment Lower Extremity Assessment: Defer to PT evaluation   Cervical / Trunk Assessment Cervical / Trunk Assessment: Normal   Communication Communication Communication: No difficulties   Cognition Arousal/Alertness: Awake/alert Behavior During Therapy: WFL for tasks assessed/performed;Anxious (anxious per pt report) Overall Cognitive Status: Within Functional Limits for tasks assessed                                 General Comments: pt endorsing feeling 'anxious' about going home     General Comments  mild edema to LE's    Exercises     Shoulder Instructions      Home Living Family/patient expects to be discharged to:: Private residence Living Arrangements: Children Available Help at Discharge: Family;Available 24 hours/day Type of Home: Mobile home Home Access: Stairs to enter Entrance Stairs-Number of Steps:  5 Entrance Stairs-Rails: Can reach both Home Layout: One level     Bathroom Shower/Tub: Teacher, early years/pre: Standard     Home Equipment: Building services engineer Comments: pt endorsing son does not work so will be available for 24hr S/A for IADL/ADLs at needed      Prior Functioning/Environment Prior Level of Function : Independent/Modified Independent             Mobility Comments: indep without DME ADLs Comments: indep all ADLs        OT Problem List: Impaired balance (sitting and/or standing);Decreased knowledge of use of DME or AE;Decreased knowledge of precautions;Decreased activity tolerance      OT Treatment/Interventions: Self-care/ADL training;DME and/or AE instruction;Patient/family education;Therapeutic exercise  OT educated pt on positioning, pain management, reviewed benefit of scheduled pain medication overnight pending MD's recommendation. Reviewed use of DME including long handled tools, shower chair and BSC. Discussed log roll technique and pillow placement for spinal alignment and trunk support. Provided pt with handout for spinal surgery   OT Goals(Current goals can be found in the care plan section) Acute Rehab OT Goals Patient Stated Goal: to be able to be indep OT Goal Formulation: With patient Time For Goal Achievement: 05/17/21 Potential to Achieve Goals: Good ADL Goals Additional ADL Goal #1: pt will be mod indep with completion of basie ADLs including dressing, bathing and  toileting with use of DME/AE prn Additional ADL Goal #2: pt will be mod indep with all basic self care tasks  OT Frequency: Min 1X/week   Barriers to D/C:            Co-evaluation              AM-PAC OT "6 Clicks" Daily Activity     Outcome Measure Help from another person eating meals?: None Help from another person taking care of personal grooming?: None Help from another person toileting, which includes using toliet, bedpan, or urinal?: A  Little Help from another person bathing (including washing, rinsing, drying)?: A Little Help from another person to put on and taking off regular upper body clothing?: A Little   6 Click Score: 17   End of Session Equipment Utilized During Treatment: Back brace Nurse Communication: Mobility status  Activity Tolerance: Patient tolerated treatment well Patient left: in chair;with call bell/phone within reach;Other (comment)  OT Visit Diagnosis: Unsteadiness on feet (R26.81)                Time: 0097-9499 OT Time Calculation (min): 47 min Charges:  OT General Charges $OT Visit: 1 Visit OT Evaluation $OT Eval Low Complexity: 1 Low OT Treatments $Self Care/Home Management : 23-37 mins Jocelin Schuelke OTR/L acute rehab services Office: [phone]   Toula Moos Adreena Willits 05/10/2021, 9:01 AM

## 2021-05-15 NOTE — Discharge Summary (Signed)
Patient ID: Ashley [name] MRN: [id number] DOB/AGE: 1960/10/13 60 y.o.  Admit date: 05/09/2021 Discharge date: 05/10/2021  Admission Diagnoses:  Principal Problem:   Lumbar stenosis Active Problems:   Spinal stenosis of lumbar region   Synovial cyst of lumbar facet joint   Discharge Diagnoses:  Principal Problem:   Lumbar stenosis Active Problems:   Spinal stenosis of lumbar region   Synovial cyst of lumbar facet joint  status post Procedure(s): L4-5 EXCISION OF INTRASPINAL EXTRADURAL FACET CYST, RIGHT TRANSFORAMINAL LUMBAR INTERBODY FUSION, PEDICLE INSTRUMENTATION, CAGE  Past Medical History:  Diagnosis Date   Essential tremor    GERD (gastroesophageal reflux disease)    History of cholecystectomy    Hypertension    Neuropathy    Palpitations    Seasonal allergies    Sinus infection     Surgeries: Procedure(s): L4-5 EXCISION OF INTRASPINAL EXTRADURAL FACET CYST, RIGHT TRANSFORAMINAL LUMBAR INTERBODY FUSION, PEDICLE INSTRUMENTATION, CAGE on 05/09/2021   Consultants:   Discharged Condition: Improved  Hospital Course: Ashley [name] is an 60 y.o. female who was admitted 05/09/2021 for operative treatment of Lumbar stenosis. Patient failed conservative treatments (please see the history and physical for the specifics) and had severe unremitting pain that affects sleep, daily activities and work/hobbies. After pre-op clearance, the patient was taken to the operating room on 05/09/2021 and underwent  Procedure(s): L4-5 EXCISION OF INTRASPINAL EXTRADURAL FACET CYST, RIGHT TRANSFORAMINAL LUMBAR INTERBODY FUSION, PEDICLE INSTRUMENTATION, CAGE.    Patient was given perioperative antibiotics:  Anti-infectives (From admission, onward)    Start     Dose/Rate Route Frequency Ordered Stop   05/09/21 0600  ceFAZolin (ANCEF) IVPB 3g/100 mL premix  Status:  Discontinued        3 g 200 mL/hr over 30 Minutes Intravenous On call to O.R. 05/09/21 0558 05/09/21 1342         Patient was given sequential compression devices and early ambulation to prevent DVT.   Patient benefited maximally from hospital stay and there were no complications. At the time of discharge, the patient was urinating/moving their bowels without difficulty, tolerating a regular diet, pain is controlled with oral pain medications and they have been cleared by PT/OT.   Recent vital signs: No data found.   Recent laboratory studies: No results for input(s): WBC, HGB, HCT, PLT, NA, K, CL, CO2, BUN, CREATININE, GLUCOSE, INR, CALCIUM in the last 72 hours.  Invalid input(s): PT, 2   Discharge Medications:   Allergies as of 05/10/2021       Reactions   Azithromycin Itching   Chocolate Hives   Mobic [meloxicam]    Emotional, crying.   Neomycin Hives   Neosporin [neomycin-bacitracin Zn-polymyx] Hives   Strawberry Extract Hives   Erythromycin Hives        Medication List     STOP taking these medications    cephALEXin 500 MG capsule Commonly known as: KEFLEX   ondansetron 4 MG tablet Commonly known as: Zofran       TAKE these medications    CALCIUM CITRATE PO Take 1 tablet by mouth daily.   Cyanocobalamin 500 MCG Subl Place 500 mcg under the tongue daily.   dicyclomine 10 MG capsule Commonly known as: BENTYL Take 10 mg by mouth in the morning and at bedtime.   EQ Vision Formula 50+ Caps Take 1 capsule by mouth daily.   flintstones complete 60 MG chewable tablet Chew 1 tablet by mouth daily.   fluticasone 50 MCG/ACT nasal spray Commonly known as: FLONASE  Place 1 spray into the nose 2 (two) times daily.   gabapentin 300 MG capsule Commonly known as: NEURONTIN TAKE 1 CAPSULE BY MOUTH AT  BEDTIME What changed:  when to take this reasons to take this   hydrOXYzine 50 MG tablet Commonly known as: ATARAX Take 50 mg by mouth 3 (three) times daily as needed for itching.   levocetirizine 5 MG tablet Commonly known as: XYZAL Take 5 mg by mouth daily.    methocarbamol 500 MG tablet Commonly known as: Robaxin Take 1 tablet (500 mg total) by mouth every 6 (six) hours as needed for muscle spasms.   montelukast 10 MG tablet Commonly known as: SINGULAIR Take 10 mg by mouth daily as needed (allergies).   oxyCODONE-acetaminophen 5-325 MG tablet Commonly known as: PERCOCET/ROXICET Take 1 tablet by mouth every 4 (four) hours as needed for severe pain.   propranolol ER 80 MG 24 hr capsule Commonly known as: INDERAL LA TAKE 1 CAPSULE (80 MG TOTAL) BY MOUTH DAILY.   Vitamin D 50 MCG (2000 UT) tablet Take 2,000 Units by mouth daily.        Diagnostic Studies: DG Lumbar Spine 2-3 Views  Result Date: 05/09/2021 CLINICAL DATA:  L4-5 fusion EXAM: LUMBAR SPINE - 2-3 VIEW; DG C-ARM 1-60 MIN-NO REPORT COMPARISON:  03/18/2021 FINDINGS: C-arm images lumbar spine demonstrate bilateral pedicle screw and interbody fusion in the lower lumbar spine. This appears to be L4-5. Limited bony detail. IMPRESSION: PLIF lower lumbar spine appears to be L4-5. Follow-up radiographs recommended to confirm surgical level. Electronically Signed   By: Marlan Palau M.D.   On: 05/09/2021 14:01   DG C-Arm 1-60 Min-No Report  Result Date: 05/09/2021 CLINICAL DATA:  L4-5 fusion EXAM: LUMBAR SPINE - 2-3 VIEW; DG C-ARM 1-60 MIN-NO REPORT COMPARISON:  03/18/2021 FINDINGS: C-arm images lumbar spine demonstrate bilateral pedicle screw and interbody fusion in the lower lumbar spine. This appears to be L4-5. Limited bony detail. IMPRESSION: PLIF lower lumbar spine appears to be L4-5. Follow-up radiographs recommended to confirm surgical level. Electronically Signed   By: Marlan Palau M.D.   On: 05/09/2021 14:01   DG C-Arm 1-60 Min-No Report  Result Date: 05/09/2021 CLINICAL DATA:  L4-5 fusion EXAM: LUMBAR SPINE - 2-3 VIEW; DG C-ARM 1-60 MIN-NO REPORT COMPARISON:  03/18/2021 FINDINGS: C-arm images lumbar spine demonstrate bilateral pedicle screw and interbody fusion in the lower  lumbar spine. This appears to be L4-5. Limited bony detail. IMPRESSION: PLIF lower lumbar spine appears to be L4-5. Follow-up radiographs recommended to confirm surgical level. Electronically Signed   By: Marlan Palau M.D.   On: 05/09/2021 14:01   DG C-Arm 1-60 Min-No Report  Result Date: 05/09/2021 Fluoroscopy was utilized by the requesting physician.  No radiographic interpretation.    Discharge Instructions     Incentive spirometry RT   Complete by: As directed         Follow-up Information     Eldred Manges, MD Follow up today.   Specialty: Orthopedic Surgery Why: need return office visit one week postop with dr Ophelia Charter.  call to schedule appointment. Contact information: [address] [place] [address] [phone]                 Discharge Plan:  discharge to home  Disposition:     Signed: Zonia Kief  05/15/2021, 11:43 AM

## 2021-05-16 ENCOUNTER — Ambulatory Visit (INDEPENDENT_AMBULATORY_CARE_PROVIDER_SITE_OTHER): Payer: 59 | Admitting: Orthopaedic Surgery

## 2021-05-16 ENCOUNTER — Encounter: Payer: 59 | Admitting: Orthopaedic Surgery

## 2021-05-16 ENCOUNTER — Encounter: Payer: Self-pay | Admitting: Orthopaedic Surgery

## 2021-05-16 ENCOUNTER — Ambulatory Visit (INDEPENDENT_AMBULATORY_CARE_PROVIDER_SITE_OTHER): Payer: 59

## 2021-05-16 ENCOUNTER — Other Ambulatory Visit: Payer: Self-pay

## 2021-05-16 VITALS — BP 111/67 | HR 77 | Ht 67.0 in | Wt 267.0 lb

## 2021-05-16 DIAGNOSIS — Z981 Arthrodesis status: Secondary | ICD-10-CM

## 2021-05-16 MED ORDER — OXYCODONE-ACETAMINOPHEN 5-325 MG PO TABS: 1.0000 | ORAL_TABLET | ORAL | 0 refills | Status: DC | PRN

## 2021-05-16 NOTE — Progress Notes (Addendum)
Post-Op Visit Note   Patient: Ashley [name]           Date of Birth: 1961-03-14           MRN: [id number] Visit Date: 05/16/2021 PCP: April Manson, NP   Assessment & Plan: Post lumbar fusion for intraspinal extradural facet cyst causing severe stenosis at L4-5.  Percocet renewed.  Anterior tib gastrocsoleus is intact sensation is intact.  Sometimes she is felt a little bit lightheaded.  She is finally had a good bowel movement.  Staples are intact.  Return 1 week for staple removal.  She is walking daily and good relief of preop stenosis and radicular leg symptoms.  Chief Complaint:  Chief Complaint  Patient presents with   Lower Back - Routine Post Op    05/09/2021 L4-5 excision of extradural, intraspinal facet cyst, Right TLIF   Visit Diagnoses:  1. Status post lumbar spinal fusion     Plan: Return 1 week for staple removal.  New Mepilex applied.  Follow-Up Instructions: Return in about 1 week (around 05/23/2021).   Orders:  Orders Placed This Encounter  Procedures   XR Lumbar Spine 2-3 Views   No orders of the defined types were placed in this encounter.   Imaging: No results found.  PMFS History: Patient Active Problem List   Diagnosis Date Noted   Lumbar stenosis 05/09/2021   Synovial cyst of lumbar facet joint 05/09/2021   Spinal stenosis of lumbar region 01/22/2021   Ganglion cyst of finger 09/21/2019   Edema 11/21/2018   Tremor 11/21/2018   Genital herpes simplex 11/17/2018   Primary osteoarthritis involving multiple joints 12/29/2017   S/P gastric bypass 12/29/2017   Gastroesophageal reflux disease without esophagitis 09/24/2017   Non-seasonal allergic rhinitis 09/24/2017   Acquired hallux interphalangeus 04/21/2017   Pain in toe of left foot 04/21/2017   Antibiotic-induced yeast infection 03/01/2017   Irritable bowel syndrome with both constipation and diarrhea 12/25/2016   Vitamin D deficiency 12/25/2016   Primary osteoarthritis of first  carpometacarpal joint of right hand 06/15/2016   CTS (carpal tunnel syndrome) 07/02/2014   Essential tremor 03/23/2013   History of cholecystectomy 06/08/2011   Neuropathy 06/08/2011   Past Medical History:  Diagnosis Date   Essential tremor    GERD (gastroesophageal reflux disease)    History of cholecystectomy    Hypertension    Neuropathy    Palpitations    Seasonal allergies    Sinus infection     Family History  Problem Relation Age of Onset   Colon cancer Neg Hx    Esophageal cancer Neg Hx    Rectal cancer Neg Hx    Stomach cancer Neg Hx     Past Surgical History:  Procedure Laterality Date   CARPAL TUNNEL RELEASE Right 2005   left 2011   CESAREAN SECTION  1989   CHOLECYSTECTOMY  2008   CORRECTION OF HAMMER TOE REPAIR AND BUNION CORRECTION  2018   both feet   Hammer Toe Repair Bilateral 09/11/2016   #2 Toe Bilateral    HEEL SPUR SURGERY Left    history of neuroplasty decompression median nerve at carpal tunnel     LAPAROSCOPIC GASTRIC SLEEVE RESECTION WITH HIATAL HERNIA REPAIR  04/09/2015   Procedure: LAPAROSCOPIC GASTRIC SLEEVE RESECTION WITH HIATAL HERNIA REPAIR;  Surgeon: Glenna Fellows, MD;  Location: [facility];  Service: General;;   thumb joint replacement Bilateral    R- 03/02/17 & L- 07/21/16   UPPER GI ENDOSCOPY  04/09/2015   Procedure: UPPER GI ENDOSCOPY;  Surgeon: Glenna Fellows, MD;  Location: [facility];  Service: General;;   Social History   Occupational History   Occupation: Not currently working post-op    Employer: [employer]  Tobacco Use   Smoking status: Never   Smokeless tobacco: Never  Vaping Use   Vaping Use: Never used  Substance and Sexual Activity   Alcohol use: No   Drug use: No   Sexual activity: Not on file

## 2021-05-21 ENCOUNTER — Ambulatory Visit (INDEPENDENT_AMBULATORY_CARE_PROVIDER_SITE_OTHER): Payer: 59 | Admitting: Orthopaedic Surgery

## 2021-05-21 ENCOUNTER — Encounter: Payer: Self-pay | Admitting: Orthopaedic Surgery

## 2021-05-21 ENCOUNTER — Other Ambulatory Visit: Payer: Self-pay

## 2021-05-21 VITALS — BP 112/61 | HR 76 | Ht 67.0 in | Wt 267.0 lb

## 2021-05-21 DIAGNOSIS — Z981 Arthrodesis status: Secondary | ICD-10-CM

## 2021-05-21 NOTE — Progress Notes (Signed)
Post-Op Visit Note   Patient: Ashley [name]           Date of Birth: 1961-02-01           MRN: [id number] Visit Date: 05/21/2021 PCP: April Manson, NP   Assessment & Plan: Status post L4-5 fusion for intraspinal extradural facet cyst with instability and severe stenosis.  She has a little bit of discomfort in her right hip region.  She does better if she sleeps on her opposite hip.  Staples are removed today she is walking better using her brace avoiding bending turning and twisting.  We have encouraged her to increase her walking gradually.  Chief Complaint:  Chief Complaint  Patient presents with   Lower Back - Follow-up, Routine Post Op    05/09/2021 L4-5 excision of extradural, intraspinal facet cyst, right TLIF   Visit Diagnoses:  1. S/P lumbar fusion     Plan: Staples removed Steri-Strips applied recheck 1 month.  2 view x-ray on return.  Follow-Up Instructions: No follow-ups on file.   Orders:  No orders of the defined types were placed in this encounter.  No orders of the defined types were placed in this encounter.   Imaging: No results found.  PMFS History: Patient Active Problem List   Diagnosis Date Noted   S/P lumbar fusion 05/16/2021   Lumbar stenosis 05/09/2021   Synovial cyst of lumbar facet joint 05/09/2021   Spinal stenosis of lumbar region 01/22/2021   Ganglion cyst of finger 09/21/2019   Edema 11/21/2018   Tremor 11/21/2018   Genital herpes simplex 11/17/2018   Primary osteoarthritis involving multiple joints 12/29/2017   S/P gastric bypass 12/29/2017   Gastroesophageal reflux disease without esophagitis 09/24/2017   Non-seasonal allergic rhinitis 09/24/2017   Acquired hallux interphalangeus 04/21/2017   Pain in toe of left foot 04/21/2017   Antibiotic-induced yeast infection 03/01/2017   Irritable bowel syndrome with both constipation and diarrhea 12/25/2016   Vitamin D deficiency 12/25/2016   Primary osteoarthritis of first  carpometacarpal joint of right hand 06/15/2016   CTS (carpal tunnel syndrome) 07/02/2014   Essential tremor 03/23/2013   History of cholecystectomy 06/08/2011   Neuropathy 06/08/2011   Past Medical History:  Diagnosis Date   Essential tremor    GERD (gastroesophageal reflux disease)    History of cholecystectomy    Hypertension    Neuropathy    Palpitations    Seasonal allergies    Sinus infection     Family History  Problem Relation Age of Onset   Colon cancer Neg Hx    Esophageal cancer Neg Hx    Rectal cancer Neg Hx    Stomach cancer Neg Hx     Past Surgical History:  Procedure Laterality Date   CARPAL TUNNEL RELEASE Right 2005   left 2011   CESAREAN SECTION  1989   CHOLECYSTECTOMY  2008   CORRECTION OF HAMMER TOE REPAIR AND BUNION CORRECTION  2018   both feet   Hammer Toe Repair Bilateral 09/11/2016   #2 Toe Bilateral    HEEL SPUR SURGERY Left    history of neuroplasty decompression median nerve at carpal tunnel     LAPAROSCOPIC GASTRIC SLEEVE RESECTION WITH HIATAL HERNIA REPAIR  04/09/2015   Procedure: LAPAROSCOPIC GASTRIC SLEEVE RESECTION WITH HIATAL HERNIA REPAIR;  Surgeon: Glenna Fellows, MD;  Location: [facility];  Service: General;;   thumb joint replacement Bilateral    R- 03/02/17 & L- 07/21/16   UPPER GI ENDOSCOPY  04/09/2015  Procedure: UPPER GI ENDOSCOPY;  Surgeon: Glenna Fellows, MD;  Location: [facility];  Service: General;;   Social History   Occupational History   Occupation: Not currently working post-op    Employer: [employer]  Tobacco Use   Smoking status: Never   Smokeless tobacco: Never  Vaping Use   Vaping Use: Never used  Substance and Sexual Activity   Alcohol use: No   Drug use: No   Sexual activity: Not on file

## 2021-05-23 ENCOUNTER — Encounter: Payer: 59 | Admitting: Orthopaedic Surgery

## 2021-05-28 ENCOUNTER — Other Ambulatory Visit: Payer: Self-pay | Admitting: Orthopaedic Surgery

## 2021-05-29 ENCOUNTER — Other Ambulatory Visit: Payer: Self-pay | Admitting: Orthopaedic Surgery

## 2021-05-29 MED ORDER — TRAMADOL HCL 50 MG PO TABS: 50.0000 mg | ORAL_TABLET | Freq: Four times a day (QID) | ORAL | 0 refills | Status: DC | PRN

## 2021-05-29 NOTE — Progress Notes (Signed)
I called her she has had 2 prescription for Percocet.  Switch to tramadol 40 tablets 50 mg strength sent in 1 p.o. every 6 hours as needed.  She is doing her daily walking.  Good relief of preop leg pain making progress and has appointment to see me at the end of the month.

## 2021-05-29 NOTE — Telephone Encounter (Signed)
Noted. Could you please refuse this rx? It will not allow me to. Thanks.

## 2021-06-03 ENCOUNTER — Other Ambulatory Visit: Payer: Self-pay

## 2021-06-03 ENCOUNTER — Ambulatory Visit (INDEPENDENT_AMBULATORY_CARE_PROVIDER_SITE_OTHER): Payer: 59 | Admitting: Podiatry

## 2021-06-03 ENCOUNTER — Encounter: Payer: Self-pay | Admitting: Podiatry

## 2021-06-03 ENCOUNTER — Ambulatory Visit (INDEPENDENT_AMBULATORY_CARE_PROVIDER_SITE_OTHER): Payer: 59

## 2021-06-03 DIAGNOSIS — M2031 Hallux varus (acquired), right foot: Secondary | ICD-10-CM

## 2021-06-03 DIAGNOSIS — M2041 Other hammer toe(s) (acquired), right foot: Secondary | ICD-10-CM

## 2021-06-03 DIAGNOSIS — Z9889 Other specified postprocedural states: Secondary | ICD-10-CM

## 2021-06-03 NOTE — Progress Notes (Signed)
She presents today for her follow-up of her hallux IPJ fusion date of surgery 01/31/2021 states that is doing much better.  Recently had back surgery.  Objective: Vital signs are stable alert and oriented x3.  Pulses are palpable.  Radiographs taken today demonstrate well-healing fusion with internal fixation in good position and not loosening.  She has good range of motion on dorsiflexion plantarflexion is somewhat painful.  Assessment: Well-healing surgical foot.  Plan: Follow-up with [place] on an as-needed basis.

## 2021-06-06 ENCOUNTER — Other Ambulatory Visit: Payer: Self-pay

## 2021-06-06 ENCOUNTER — Ambulatory Visit (INDEPENDENT_AMBULATORY_CARE_PROVIDER_SITE_OTHER): Payer: 59

## 2021-06-06 ENCOUNTER — Ambulatory Visit
Admission: EM | Admit: 2021-06-06 | Discharge: 2021-06-06 | Disposition: A | Discharge status: Home / Self Care | Payer: 59 | Attending: Family Medicine | Admitting: Family Medicine

## 2021-06-06 DIAGNOSIS — M79642 Pain in left hand: Secondary | ICD-10-CM

## 2021-06-06 MED ORDER — PREDNISONE 20 MG PO TABS: 40.0000 mg | ORAL_TABLET | Freq: Every day | ORAL | 0 refills | Status: DC

## 2021-06-06 NOTE — ED Provider Notes (Signed)
[facility]    CSN: [id number] Arrival date & time: 06/06/21  1705     History   Chief Complaint Chief Complaint  Patient presents with   Hand Injury    HPI KATELINE DONE is a 61 y.o. female.   Patient presenting today with left ulnar aspect of hand pain and swelling, firm tender area dorsally for the past 5 to 7 days.  No known injury prior to onset.  Denies numbness, tingling, decreased range of motion but states that the pain is worse when she is holding something with the hand.  Has been taking over-the-counter pain relievers with mild temporary relief of pain.  Does have a history of ganglion cysts in this hand but states it feels different than this.   Past Medical History:  Diagnosis Date   Essential tremor    GERD (gastroesophageal reflux disease)    History of cholecystectomy    Hypertension    Neuropathy    Palpitations    Seasonal allergies    Sinus infection     Patient Active Problem List   Diagnosis Date Noted   S/P lumbar fusion 05/16/2021   Lumbar stenosis 05/09/2021   Synovial cyst of lumbar facet joint 05/09/2021   Spinal stenosis of lumbar region 01/22/2021   Ganglion cyst of finger 09/21/2019   Edema 11/21/2018   Tremor 11/21/2018   Genital herpes simplex 11/17/2018   Primary osteoarthritis involving multiple joints 12/29/2017   S/P gastric bypass 12/29/2017   Gastroesophageal reflux disease without esophagitis 09/24/2017   Non-seasonal allergic rhinitis 09/24/2017   Acquired hallux interphalangeus 04/21/2017   Pain in toe of left foot 04/21/2017   Antibiotic-induced yeast infection 03/01/2017   Irritable bowel syndrome with both constipation and diarrhea 12/25/2016   Vitamin D deficiency 12/25/2016   Primary osteoarthritis of first carpometacarpal joint of right hand 06/15/2016   CTS (carpal tunnel syndrome) 07/02/2014   Essential tremor 03/23/2013   History of cholecystectomy 06/08/2011   Neuropathy 06/08/2011    Past  Surgical History:  Procedure Laterality Date   CARPAL TUNNEL RELEASE Right 2005   left 2011   [place]  2008   CORRECTION OF HAMMER TOE REPAIR AND BUNION CORRECTION  2018   both feet   Hammer Toe Repair Bilateral 09/11/2016   #2 Toe Bilateral    HEEL SPUR SURGERY Left    history of neuroplasty decompression median nerve at carpal tunnel     LAPAROSCOPIC GASTRIC SLEEVE RESECTION WITH HIATAL HERNIA REPAIR  04/09/2015   Procedure: LAPAROSCOPIC GASTRIC SLEEVE RESECTION WITH HIATAL HERNIA REPAIR;  Surgeon: Excell Seltzer, MD;  Location: [facility];  Service: General;;   thumb joint replacement Bilateral    R- 03/02/17 & L- 07/21/16   UPPER GI ENDOSCOPY  04/09/2015   Procedure: UPPER GI ENDOSCOPY;  Surgeon: Excell Seltzer, MD;  Location: [facility];  Service: General;;    OB History   No obstetric history on file.    Home Medications    Prior to Admission medications   Medication Sig Start Date End Date Taking? Authorizing Provider  predniSONE (DELTASONE) 20 MG tablet Take 2 tablets (40 mg total) by mouth daily with breakfast. 06/06/21  Yes Volney American, PA-C  CALCIUM CITRATE PO Take 1 tablet by mouth daily.    [provider]  Cholecalciferol (VITAMIN D) 50 MCG (2000 UT) tablet Take 2,000 Units by mouth daily.    [provider]  Cyanocobalamin 500 MCG SUBL Place 500  mcg under the tongue daily.    [provider]  dicyclomine (BENTYL) 10 MG capsule Take 10 mg by mouth in the morning and at bedtime.    [provider]  flintstones complete (FLINTSTONES) 60 MG chewable tablet Chew 1 tablet by mouth daily.    [provider]  fluticasone (FLONASE) 50 MCG/ACT nasal spray Place 1 spray into the nose 2 (two) times daily.  06/19/14 05/01/21  [provider]  gabapentin (NEURONTIN) 300 MG capsule TAKE 1 CAPSULE BY MOUTH AT  BEDTIME Patient taking differently: Take 300 mg by mouth at bedtime as needed  (pain). 04/14/21   [name], MD  hydrOXYzine (ATARAX/VISTARIL) 50 MG tablet Take 50 mg by mouth 3 (three) times daily as needed for itching. 10/28/20   [provider]  levocetirizine (XYZAL) 5 MG tablet Take 5 mg by mouth daily. 06/17/20   [provider]  methocarbamol (ROBAXIN) 500 MG tablet Take 1 tablet (500 mg total) by mouth every 6 (six) hours as needed for muscle spasms. 05/09/21   Lanae Crumbly, PA-C  montelukast (SINGULAIR) 10 MG tablet Take 10 mg by mouth daily as needed (allergies).    [provider]  Multiple Vitamins-Minerals (EQ VISION FORMULA 50+) CAPS Take 1 capsule by mouth daily.    [provider]  oxyCODONE-acetaminophen (PERCOCET/ROXICET) 5-325 MG tablet Take 1 tablet by mouth every 4 (four) hours as needed for severe pain. 05/16/21   [name], MD  propranolol ER (INDERAL LA) 80 MG 24 hr capsule TAKE 1 CAPSULE (80 MG TOTAL) BY MOUTH DAILY. 09/13/17   Melvenia Beam, MD  traMADol (ULTRAM) 50 MG tablet Take 1 tablet (50 mg total) by mouth every 6 (six) hours as needed. 05/29/21   [name], MD    Family History Family History  Problem Relation Age of Onset   Colon cancer Neg Hx    Esophageal cancer Neg Hx    Rectal cancer Neg Hx    Stomach cancer Neg Hx    Social History Social History   Tobacco Use   Smoking status: Never   Smokeless tobacco: Never  Vaping Use   Vaping Use: Never used  Substance Use Topics   Alcohol use: No   Drug use: No     Allergies   Azithromycin, Chocolate, Mobic [meloxicam], Neomycin, Neosporin [neomycin-bacitracin zn-polymyx], Strawberry extract, and Erythromycin   Review of Systems Review of Systems PER HPI  Physical Exam Triage Vital Signs ED Triage Vitals [06/06/21 1728]  Enc Vitals Group     BP 133/69     Pulse Rate 82     Resp 18     Temp 98.4 F (36.9 C)     Temp Source Oral     SpO2 94 %     Weight      Height      Head Circumference      Peak Flow      Pain  Score 7     Pain Loc      Pain Edu?      Excl. in [place]?    No data found.  Updated Vital Signs BP 133/69 (BP Location: Right Arm)    Pulse 82    Temp 98.4 F (36.9 C) (Oral)    Resp 18    SpO2 94%   Visual Acuity Right Eye Distance:   Left Eye Distance:   Bilateral Distance:    Right Eye Near:   Left Eye Near:  Bilateral Near:     Physical Exam Vitals and nursing note reviewed.  Constitutional:      Appearance: Normal appearance. She is not ill-appearing.  HENT:     Head: Atraumatic.     Mouth/Throat:     Mouth: Mucous membranes are moist.  Eyes:     Extraocular Movements: Extraocular movements intact.     Conjunctiva/sclera: Conjunctivae normal.  Cardiovascular:     Rate and Rhythm: Normal rate and regular rhythm.     Heart sounds: Normal heart sounds.  Pulmonary:     Effort: Pulmonary effort is normal.     Breath sounds: Normal breath sounds.  Musculoskeletal:        General: Swelling and tenderness present. No deformity or signs of injury. Normal range of motion.     Cervical back: Normal range of motion and neck supple.     Comments: Area of edema, firmness, tenderness to palpation dorsally on left hand at fourth and fifth metacarpal  Skin:    General: Skin is warm and dry.     Findings: No bruising or erythema.  Neurological:     Mental Status: She is alert and oriented to person, place, and time.     Motor: No weakness.     Gait: Gait normal.     Comments: Left hand neurovascularly intact  Psychiatric:        Mood and Affect: Mood normal.        Thought Content: Thought content normal.        Judgment: Judgment normal.     UC Treatments / Results  Labs (all labs ordered are listed, but only abnormal results are displayed) Labs Reviewed - No data to display  EKG   Radiology DG Hand Complete Left  Result Date: 06/06/2021 CLINICAL DATA:  Dorsal left hand pain and swelling for several days EXAM: LEFT HAND - COMPLETE 3+ VIEW COMPARISON:  None.  FINDINGS: Frontal, oblique, and lateral views of the left hand are obtained. Prior resection of the trapezium and likely partial resection of the trapezoid within the radial aspect carpus. There are no acute or destructive bony lesions. There is mild joint space narrowing of the first metacarpophalangeal joint and throughout the distal interphalangeal joints, consistent with osteoarthritis. Soft tissues are unremarkable. IMPRESSION: 1. Mild diffuse osteoarthritis. 2. No acute bony abnormality. 3. Postsurgical changes within the radial aspect of the carpus. Electronically Signed   By: Randa Ngo M.D.   On: 06/06/2021 18:27    Procedures Procedures (including critical care time)  Medications Ordered in UC Medications - No data to display  Initial Impression / Assessment and Plan / UC Course  I have reviewed the triage vital signs and the nursing notes.  Pertinent labs & imaging results that were available during my care of the patient were reviewed by me and considered in my medical decision making (see chart for details).     X-ray today showing no acute bony abnormality, chronic arthritis changes present.  We will try prednisone for inflammatory cause of pain, discussed RICE protocol and Ortho follow-up if not resolving.  Final Clinical Impressions(s) / UC Diagnoses   Final diagnoses:  Left hand pain   Discharge Instructions   None    ED Prescriptions     Medication Sig Dispense Auth. Provider   predniSONE (DELTASONE) 20 MG tablet Take 2 tablets (40 mg total) by mouth daily with breakfast. 10 tablet Volney American, [place]      PDMP not reviewed this encounter.  Volney American, [place] 06/06/21 1846

## 2021-06-06 NOTE — ED Triage Notes (Signed)
Patient states that this week her left hand started hurting while holding her phone.  Patient states her left hand has been swollen all week.   Patient states she only took her prescribed meds for the pain  Patient denies injury

## 2021-06-19 ENCOUNTER — Encounter: Payer: Self-pay | Admitting: Podiatry

## 2021-06-21 ENCOUNTER — Other Ambulatory Visit: Payer: Self-pay | Admitting: Orthopaedic Surgery

## 2021-06-24 ENCOUNTER — Other Ambulatory Visit: Payer: Self-pay

## 2021-06-24 ENCOUNTER — Ambulatory Visit (INDEPENDENT_AMBULATORY_CARE_PROVIDER_SITE_OTHER): Payer: 59 | Admitting: Orthopaedic Surgery

## 2021-06-24 ENCOUNTER — Ambulatory Visit (INDEPENDENT_AMBULATORY_CARE_PROVIDER_SITE_OTHER): Payer: 59

## 2021-06-24 ENCOUNTER — Encounter: Payer: Self-pay | Admitting: Orthopaedic Surgery

## 2021-06-24 VITALS — BP 131/82 | HR 68

## 2021-06-24 DIAGNOSIS — Z981 Arthrodesis status: Secondary | ICD-10-CM | POA: Diagnosis not present

## 2021-06-24 NOTE — Progress Notes (Signed)
Post-Op Visit Note   Patient: Ashley [name]           Date of Birth: 06/01/1960           MRN: [id number] Visit Date: 06/24/2021 PCP: April Manson, NP   Assessment & Plan: Post single level decompression fusion L4-5.  Work slip given for work resumption on 06/30/2021 for work at home for 2 weeks then she can resume work in the office.  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  Good relief of preop pain continue brace continue avoiding bending lifting.  She can walk daily as instructed and return in 2 months with AP lateral lumbar spine x-rays on return.  Chief Complaint:  Chief Complaint  Patient presents with   Neck - Routine Post Op   Visit Diagnoses:  1. S/P lumbar fusion     Plan: Return in 2 months x-rays as above work note as above.  Follow-Up Instructions: No follow-ups on file.   Orders:  Orders Placed This Encounter  Procedures   XR Lumbar Spine 2-3 Views   No orders of the defined types were placed in this encounter.   Imaging: XR Lumbar Spine 2-3 Views  Result Date: 06/24/2021 AP lateral lumbar spine images are obtained.  This shows decompression instrumented fusion L4-5 good position of cage graft and screws no loosening. Impression: L4-5 fusion with stable fixation.   PMFS History: Patient Active Problem List   Diagnosis Date Noted   S/P lumbar fusion 05/16/2021   Lumbar stenosis 05/09/2021   Synovial cyst of lumbar facet joint 05/09/2021   Spinal stenosis of lumbar region 01/22/2021   Ganglion cyst of finger 09/21/2019   Edema 11/21/2018   Tremor 11/21/2018   Genital herpes simplex 11/17/2018   Primary osteoarthritis involving multiple joints 12/29/2017   S/P gastric bypass 12/29/2017   Gastroesophageal reflux disease without esophagitis 09/24/2017   Non-seasonal allergic rhinitis 09/24/2017   Acquired hallux interphalangeus 04/21/2017   Pain in toe of left foot 04/21/2017   Antibiotic-induced yeast infection 03/01/2017   Irritable  bowel syndrome with both constipation and diarrhea 12/25/2016   Vitamin D deficiency 12/25/2016   Primary osteoarthritis of first carpometacarpal joint of right hand 06/15/2016   CTS (carpal tunnel syndrome) 07/02/2014   Essential tremor 03/23/2013   History of cholecystectomy 06/08/2011   Neuropathy 06/08/2011   Past Medical History:  Diagnosis Date   Essential tremor    GERD (gastroesophageal reflux disease)    History of cholecystectomy    Hypertension    Neuropathy    Palpitations    Seasonal allergies    Sinus infection     Family History  Problem Relation Age of Onset   Colon cancer Neg Hx    Esophageal cancer Neg Hx    Rectal cancer Neg Hx    Stomach cancer Neg Hx     Past Surgical History:  Procedure Laterality Date   CARPAL TUNNEL RELEASE Right 2005   left 2011   CESAREAN SECTION  1989   CHOLECYSTECTOMY  2008   CORRECTION OF HAMMER TOE REPAIR AND BUNION CORRECTION  2018   both feet   Hammer Toe Repair Bilateral 09/11/2016   #2 Toe Bilateral    HEEL SPUR SURGERY Left    history of neuroplasty decompression median nerve at carpal tunnel     LAPAROSCOPIC GASTRIC SLEEVE RESECTION WITH HIATAL HERNIA REPAIR  04/09/2015   Procedure: LAPAROSCOPIC GASTRIC SLEEVE RESECTION WITH HIATAL HERNIA REPAIR;  Surgeon: Glenna Fellows,  MD;  Location: [facility];  Service: General;;   thumb joint replacement Bilateral    R- 03/02/17 & L- 07/21/16   UPPER GI ENDOSCOPY  04/09/2015   Procedure: UPPER GI ENDOSCOPY;  Surgeon: Glenna Fellows, MD;  Location: [facility];  Service: General;;   Social History   Occupational History   Occupation: Not currently working post-op    Employer: [employer]  Tobacco Use   Smoking status: Never   Smokeless tobacco: Never  Vaping Use   Vaping Use: Never used  Substance and Sexual Activity   Alcohol use: No   Drug use: No   Sexual activity: Not Currently

## 2021-07-15 ENCOUNTER — Encounter: Payer: Self-pay | Admitting: Orthopaedic Surgery

## 2021-07-15 ENCOUNTER — Ambulatory Visit: Payer: 59 | Admitting: Orthopaedic Surgery

## 2021-07-15 ENCOUNTER — Other Ambulatory Visit: Payer: Self-pay

## 2021-07-15 VITALS — BP 119/86 | HR 67 | Ht 67.0 in | Wt 266.0 lb

## 2021-07-15 DIAGNOSIS — Z981 Arthrodesis status: Secondary | ICD-10-CM

## 2021-07-15 DIAGNOSIS — R29898 Other symptoms and signs involving the musculoskeletal system: Secondary | ICD-10-CM | POA: Insufficient documentation

## 2021-07-15 NOTE — Addendum Note (Signed)
Addended by: Meyer Cory on: 07/15/2021 10:11 AM   Modules accepted: Orders

## 2021-07-15 NOTE — Progress Notes (Signed)
Post-Op Visit Note   Patient: Ashley [name]           Date of Birth: [birth year]           MRN: [id number] Visit Date: 07/15/2021 PCP: April Manson, NP   Assessment & Plan: Patient returns post lumbar instrumented fusion doing well.  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  She is off her pain medication.  She has had problems with weakness in her left hand with gripping squeezing and numbness in the ulnar nerve distribution.  She denies any elbow injuries in the distant past.  Numbness tends to wake her up at night at times.  She notes weakness with gripping.  No problems with the opposite right hand.  Exam shows normal triceps.  FCU and dorsal interosseous are weak she has some hyperthenar atrophy with normal interossei on the opposite right hand and normal FCU on the right hand.  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  She has noticed weakness with gripping holding a phone driving etc.  We will set her up for some EMGs nerve conduction velocities and office follow-up after test.  No ulnar nerve subluxation at the elbow mildly positive Tinel's at the elbow.  Chief Complaint:  Chief Complaint  Patient presents with   Left Hand - Pain   Visit Diagnoses:  1. Left hand weakness     Plan: Left upper extremity EMG nerve conduction velocities rule out cubital tunnel versus compression ulnar nerve Guyon's canal.  The FCU weakness makes this more likely at the elbow.  Some component of foraminal stenosis may be present.  Follow-Up Instructions: No follow-ups on file.   Orders:  No orders of the defined types were placed in this encounter.  No orders of the defined types were placed in this encounter.   Imaging: No results found.  PMFS History: Patient Active Problem List   Diagnosis Date Noted   Left hand weakness 07/15/2021   S/P lumbar fusion 05/16/2021   Lumbar stenosis 05/09/2021   Synovial cyst of  lumbar facet joint 05/09/2021   Spinal stenosis of lumbar region 01/22/2021   Ganglion cyst of finger 09/21/2019   Edema 11/21/2018   Tremor 11/21/2018   Genital herpes simplex 11/17/2018   Primary osteoarthritis involving multiple joints 12/29/2017   S/P gastric bypass 12/29/2017   Gastroesophageal reflux disease without esophagitis 09/24/2017   Non-seasonal allergic rhinitis 09/24/2017   Acquired hallux interphalangeus 04/21/2017   Pain in toe of left foot 04/21/2017   Antibiotic-induced yeast infection 03/01/2017   Irritable bowel syndrome with both constipation and diarrhea 12/25/2016   Vitamin D deficiency 12/25/2016   Primary osteoarthritis of first carpometacarpal joint of right hand 06/15/2016   CTS (carpal tunnel syndrome) 07/02/2014   Essential tremor 03/23/2013   History of cholecystectomy 06/08/2011   Neuropathy 06/08/2011   Past Medical History:  Diagnosis Date   Essential tremor    GERD (gastroesophageal reflux disease)    History of cholecystectomy    Hypertension    Neuropathy    Palpitations    Seasonal allergies    Sinus infection     Family History  Problem Relation Age of Onset   Colon cancer Neg Hx    Esophageal cancer Neg Hx    Rectal cancer Neg Hx    Stomach cancer Neg Hx     Past Surgical History:  Procedure Laterality Date   CARPAL TUNNEL RELEASE Right 2005  left 2011   CESAREAN SECTION  1989   CHOLECYSTECTOMY  2008   CORRECTION OF HAMMER TOE REPAIR AND BUNION CORRECTION  2018   both feet   Hammer Toe Repair Bilateral 09/11/2016   #2 Toe Bilateral    HEEL SPUR SURGERY Left    history of neuroplasty decompression median nerve at carpal tunnel     LAPAROSCOPIC GASTRIC SLEEVE RESECTION WITH HIATAL HERNIA REPAIR  04/09/2015   Procedure: LAPAROSCOPIC GASTRIC SLEEVE RESECTION WITH HIATAL HERNIA REPAIR;  Surgeon: Glenna Fellows, MD;  Location: [facility];  Service: General;;   thumb joint replacement Bilateral    R- 03/02/17 & L- 07/21/16   UPPER  GI ENDOSCOPY  04/09/2015   Procedure: UPPER GI ENDOSCOPY;  Surgeon: Glenna Fellows, MD;  Location: [facility];  Service: General;;   Social History   Occupational History   Occupation: Not currently working post-op    Employer: [employer]  Tobacco Use   Smoking status: Never   Smokeless tobacco: Never  Vaping Use   Vaping Use: Never used  Substance and Sexual Activity   Alcohol use: No   Drug use: No   Sexual activity: Not Currently

## 2021-07-22 ENCOUNTER — Encounter: Payer: Self-pay | Admitting: Orthopaedic Surgery

## 2021-07-30 ENCOUNTER — Encounter: Payer: Self-pay | Admitting: Physical Medicine and Rehabilitation

## 2021-07-30 ENCOUNTER — Other Ambulatory Visit: Payer: Self-pay

## 2021-07-30 ENCOUNTER — Ambulatory Visit: Payer: 59 | Admitting: Physical Medicine and Rehabilitation

## 2021-07-30 DIAGNOSIS — R202 Paresthesia of skin: Secondary | ICD-10-CM | POA: Diagnosis not present

## 2021-07-30 NOTE — Progress Notes (Signed)
Pt state left hand and wrist pain. Pt state she has weakness, numbness and it hard for her to hold on to items. Pt stat she has pain when she working at her computer. Pt state she takes over the counter pain meds and uses ice to help ease her pain. Pt state she right handed. ? ?Numeric Pain Rating Scale and Functional Assessment ?Average Pain 5 ? ? ?In the last MONTH (on 0-10 scale) has pain interfered with the following? ? ?1. General activity like being  able to carry out your everyday physical activities such as walking, climbing stairs, carrying groceries, or moving a chair?  ?Rating(10) ? ? ? ? ?

## 2021-08-04 NOTE — Procedures (Signed)
EMG & NCV Findings: ?Evaluation of the left median motor nerve showed prolonged distal onset latency (4.4 ms), reduced amplitude (3.2 mV), and decreased conduction velocity (Elbow-Wrist, 49 m/s).  The left median (across palm) sensory nerve showed prolonged distal peak latency (Wrist, 3.8 ms).  The left radial sensory nerve showed prolonged distal peak latency (3.2 ms).  All remaining nerves (as indicated in the following tables) were within normal limits.   ? ?All examined muscles (as indicated in the following table) showed no evidence of electrical instability.   ? ?Impression: ?The above electrodiagnostic study is ABNORMAL and reveals evidence of a mild to moderate left median nerve entrapment at the wrist (carpal tunnel syndrome) affecting sensory and motor components.  ? ?There is no significant electrodiagnostic evidence of any other focal nerve entrapment (in particular ulnar nerve), brachial plexopathy or cervical radiculopathy. ** As you know, this particular electrodiagnostic study cannot rule out chemical radiculitis or sensory only radiculopathy.  However, radiculopathy to the extent of causing atrophy or weakness should be able to be detected. ? ?Recommendations: ?1.  Follow-up with referring physician. ?2.  Continue current management of symptoms. ?3.  Continue use of resting splint at night-time and as needed during the day. ? ?___________________________ ?Naaman Plummer FAAPMR ?Board Certified, Biomedical engineer of Physical Medicine and Rehabilitation ? ? ? ?Nerve Conduction Studies ?Anti Sensory Summary Table ? ? Stim Site NR Peak (ms) Norm Peak (ms) P-T Amp (?V) Norm P-T Amp Site1 Site2 Delta-P (ms) Dist (cm) Vel (m/s) Norm Vel (m/s)  ?Left Median Acr Palm Anti Sensory (2nd Digit)  31.6?C  ?Wrist    *3.8 <3.6 23.6 >10 Wrist Palm 1.8 0.0    ?Palm    2.0 <2.0 33.9         ?Left Radial Anti Sensory (Base 1st Digit)  31.4?C  ?Wrist    *3.2 <3.1 9.8  Wrist Base 1st Digit 3.2 0.0    ?Left Ulnar Anti Sensory  (5th Digit)  31.9?C  ?Wrist    3.4 <3.7 21.8 >15.0 Wrist 5th Digit 3.4 14.0 41 >38  ? ?Motor Summary Table ? ? Stim Site NR Onset (ms) Norm Onset (ms) O-P Amp (mV) Norm O-P Amp Site1 Site2 Delta-0 (ms) Dist (cm) Vel (m/s) Norm Vel (m/s)  ?Left Median Motor (Abd Poll Brev)  31.8?C  ?Wrist    *4.4 <4.2 *3.2 >5 Elbow Wrist 4.3 21.0 *49 >50  ?Elbow    8.7  3.2         ?Left Ulnar Motor (Abd Dig Min)  31.9?C  ?Wrist    3.3 <4.2 6.3 >3 B Elbow Wrist 3.3 18.0 55 >53  ?B Elbow    6.6  4.8  A Elbow B Elbow 1.1 10.0 91 >53  ?A Elbow    7.7  4.7         ? ?EMG ? ? Side Muscle Nerve Root Ins Act Fibs Psw Amp Dur Poly Recrt Int Dennie Bible Comment  ?Left Abd Poll Brev Median C8-T1 Nml Nml Nml Nml Nml 0 Nml Nml   ?Left 1stDorInt Ulnar C8-T1 Nml Nml Nml Nml Nml 0 Nml Nml   ?Left PronatorTeres Median C6-7 Nml Nml Nml Nml Nml 0 Nml Nml   ?Left Biceps Musculocut C5-6 Nml Nml Nml Nml Nml 0 Nml Nml   ?Left Deltoid Axillary C5-6 Nml Nml Nml Nml Nml 0 Nml Nml   ? ? ?Nerve Conduction Studies ?Anti Sensory Left/Right Comparison ? ? Stim Site L Lat (ms) R Lat (ms) L-R Lat (ms)  L Amp (?V) R Amp (?V) L-R Amp (%) Site1 Site2 L Vel (m/s) R Vel (m/s) L-R Vel (m/s)  ?Median Acr Palm Anti Sensory (2nd Digit)  31.6?C  ?Wrist *3.8   23.6   Wrist Palm     ?Palm 2.0   33.9         ?Radial Anti Sensory (Base 1st Digit)  31.4?C  ?Wrist *3.2   9.8   Wrist Base 1st Digit     ?Ulnar Anti Sensory (5th Digit)  31.9?C  ?Wrist 3.4   21.8   Wrist 5th Digit 41    ? ?Motor Left/Right Comparison ? ? Stim Site L Lat (ms) R Lat (ms) L-R Lat (ms) L Amp (mV) R Amp (mV) L-R Amp (%) Site1 Site2 L Vel (m/s) R Vel (m/s) L-R Vel (m/s)  ?Median Motor (Abd Poll Brev)  31.8?C  ?Wrist *4.4   *3.2   Elbow Wrist *49    ?Elbow 8.7   3.2         ?Ulnar Motor (Abd Dig Min)  31.9?C  ?Wrist 3.3   6.3   B Elbow Wrist 55    ?B Elbow 6.6   4.8   A Elbow B Elbow 91    ?A Elbow 7.7   4.7         ? ? ? ?Waveforms: ?    ? ?   ? ? ?

## 2021-08-04 NOTE — Progress Notes (Signed)
? ?Ashley [name] - 61 y.o. female MRN [id number]  Date of birth: 04-08-1961 ? ?Office Visit Note: ?Visit Date: 07/30/2021 ?PCP: [name], NP ?Referred by: [name], MD ? ?Subjective: ?Chief Complaint  ?Patient presents with  ? Left Hand - Pain, Weakness, Numbness  ? Left Wrist - Pain, Weakness, Numbness  ? ?HPI:  Ashley [name] is a 61 y.o. female who comes in today at the request of Dr. [name] for electrodiagnostic study of the Left upper extremities.  Patient is Right hand dominant.  She reports chronic worsening left hand and wrist pain with weakness and numbness in a more ulnar side of the hand.  She actually went to the emergency department in January with complaints of swelling and pain on the ulnar side of the hand.  She reports is very hard for her to hold onto items in general at this point.  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  She reports her pain is a 5 out of 10.  She has no history of prior electrodiagnostic studies.  No history of radicular complaints specifically.  She does have a history of some foraminal cervical stenosis and she is recently status post lumbar fusion.  Dr. [name] felt like she did have some atrophy of the hyperthenar area as well as weakness.  She did have a right carpal tunnel release in 2005. ? ? ?ROS Otherwise per HPI. ? ?Assessment & Plan: ?Visit Diagnoses:  ?  ICD-10-CM   ?1. Paresthesia of skin  R20.2 NCV with EMG (electromyography)  ?  ?  ?Plan: Impression: ?The above electrodiagnostic study is ABNORMAL and reveals evidence of a mild to moderate left median nerve entrapment at the wrist (carpal tunnel syndrome) affecting sensory and motor components.  ? ?There is no significant electrodiagnostic evidence of any other focal nerve entrapment (in particular ulnar nerve), brachial plexopathy or cervical radiculopathy. ** As you know, this particular electrodiagnostic study cannot rule out chemical radiculitis or sensory only  radiculopathy.  However, radiculopathy to the extent of causing atrophy or weakness should be able to be detected. ? ?Recommendations: ?1.  Follow-up with referring physician. ?2.  Continue current management of symptoms. ?3.  Continue use of resting splint at night-time and as needed during the day. ? ?Meds & Orders: No orders of the defined types were placed in this encounter. ?  ?Orders Placed This Encounter  ?Procedures  ? NCV with EMG (electromyography)  ?  ?Follow-up: Return in about 2 weeks (around 08/13/2021) for [name], MD.  ? ?Procedures: ?No procedures performed  ?EMG & NCV Findings: ?Evaluation of the left median motor nerve showed prolonged distal onset latency (4.4 ms), reduced amplitude (3.2 mV), and decreased conduction velocity (Elbow-Wrist, 49 m/s).  The left median (across palm) sensory nerve showed prolonged distal peak latency (Wrist, 3.8 ms).  The left radial sensory nerve showed prolonged distal peak latency (3.2 ms).  All remaining nerves (as indicated in the following tables) were within normal limits.   ? ?All examined muscles (as indicated in the following table) showed no evidence of electrical instability.   ? ?Impression: ?The above electrodiagnostic study is ABNORMAL and reveals evidence of a mild to moderate left median nerve entrapment at the wrist (carpal tunnel syndrome) affecting sensory and motor components.  ? ?There is no significant electrodiagnostic evidence of any other focal nerve entrapment (in particular ulnar nerve), brachial plexopathy or cervical radiculopathy. ** As you know, this particular electrodiagnostic  study cannot rule out chemical radiculitis or sensory only radiculopathy.  However, radiculopathy to the extent of causing atrophy or weakness should be able to be detected. ? ?Recommendations: ?1.  Follow-up with referring physician. ?2.  Continue current management of symptoms. ?3.  Continue use of resting splint at night-time and as needed during the  day. ? ?___________________________ ?Ashley [name] FAAPMR ?Board Certified, Tax adviser of Physical Medicine and Rehabilitation ? ? ? ?Nerve Conduction Studies ?Anti Sensory Summary Table ? ? Stim Site NR Peak (ms) Norm Peak (ms) P-T Amp (?V) Norm P-T Amp Site1 Site2 Delta-P (ms) Dist (cm) Vel (m/s) Norm Vel (m/s)  ?Left Median Acr Palm Anti Sensory (2nd Digit)  31.6?C  ?Wrist    *3.8 <3.6 23.6 >10 Wrist Palm 1.8 0.0    ?Palm    2.0 <2.0 33.9         ?Left Radial Anti Sensory (Base 1st Digit)  31.4?C  ?Wrist    *3.2 <3.1 9.8  Wrist Base 1st Digit 3.2 0.0    ?Left Ulnar Anti Sensory (5th Digit)  31.9?C  ?Wrist    3.4 <3.7 21.8 >15.0 Wrist 5th Digit 3.4 14.0 41 >38  ? ?Motor Summary Table ? ? Stim Site NR Onset (ms) Norm Onset (ms) O-P Amp (mV) Norm O-P Amp Site1 Site2 Delta-0 (ms) Dist (cm) Vel (m/s) Norm Vel (m/s)  ?Left Median Motor (Abd Poll Brev)  31.8?C  ?Wrist    *4.4 <4.2 *3.2 >5 Elbow Wrist 4.3 21.0 *49 >50  ?Elbow    8.7  3.2         ?Left Ulnar Motor (Abd Dig Min)  31.9?C  ?Wrist    3.3 <4.2 6.3 >3 B Elbow Wrist 3.3 18.0 55 >53  ?B Elbow    6.6  4.8  A Elbow B Elbow 1.1 10.0 91 >53  ?A Elbow    7.7  4.7         ? ?EMG ? ? Side Muscle Nerve Root Ins Act Fibs Psw Amp Dur Poly Recrt Int Fraser Din Comment  ?Left Abd Poll Brev Median C8-T1 Nml Nml Nml Nml Nml 0 Nml Nml   ?Left 1stDorInt Ulnar C8-T1 Nml Nml Nml Nml Nml 0 Nml Nml   ?Left PronatorTeres Median C6-7 Nml Nml Nml Nml Nml 0 Nml Nml   ?Left Biceps Musculocut C5-6 Nml Nml Nml Nml Nml 0 Nml Nml   ?Left Deltoid Axillary C5-6 Nml Nml Nml Nml Nml 0 Nml Nml   ? ? ?Nerve Conduction Studies ?Anti Sensory Left/Right Comparison ? ? Stim Site L Lat (ms) R Lat (ms) L-R Lat (ms) L Amp (?V) R Amp (?V) L-R Amp (%) Site1 Site2 L Vel (m/s) R Vel (m/s) L-R Vel (m/s)  ?Median Acr Palm Anti Sensory (2nd Digit)  31.6?C  ?Wrist *3.8   23.6   Wrist Palm     ?Palm 2.0   33.9         ?Radial Anti Sensory (Base 1st Digit)  31.4?C  ?Wrist *3.2   9.8   Wrist Base 1st Digit     ?Ulnar  Anti Sensory (5th Digit)  31.9?C  ?Wrist 3.4   21.8   Wrist 5th Digit 41    ? ?Motor Left/Right Comparison ? ? Stim Site L Lat (ms) R Lat (ms) L-R Lat (ms) L Amp (mV) R Amp (mV) L-R Amp (%) Site1 Site2 L Vel (m/s) R Vel (m/s) L-R Vel (m/s)  ?Median Motor (Abd Poll Brev)  31.8?C  ?Wrist *4.4   *  3.2   Elbow Wrist *49    ?Elbow 8.7   3.2         ?Ulnar Motor (Abd Dig Min)  31.9?C  ?Wrist 3.3   6.3   B Elbow Wrist 55    ?B Elbow 6.6   4.8   A Elbow B Elbow 91    ?A Elbow 7.7   4.7         ? ? ? ?Waveforms: ?    ? ?   ? ?  ? ?Clinical History: ?No specialty comments available.  ? ? ? ?Objective:  VS:  HT:    WT:   BMI:     BP:   HR: bpm  TEMP: ( )  RESP:  ?Physical Exam ?Musculoskeletal:     ?   General: No swelling, tenderness or deformity.  ?   Comments: Inspection reveals no atrophy of the bilateral APB or FDI or hand intrinsics.  She feels swelling on the outer ulnar side of the hand although subjectively hard to see swelling.  There is no swelling, color changes, allodynia or dystrophic changes. There is 5 out of 5 strength in the bilateral wrist extension, finger abduction and long finger flexion.  Finger abduction seemed intact although there were difference in effort.  There is intact sensation to light touch in all dermatomal and peripheral nerve distributions. There is a negative Froment's test bilaterally. There is a negative Hoffmann's test bilaterally.  ?Skin: ?   General: Skin is warm and dry.  ?   Findings: No erythema or rash.  ?Neurological:  ?   General: No focal deficit present.  ?   Mental Status: She is alert and oriented to person, place, and time.  ?   Motor: No weakness or abnormal muscle tone.  ?   Coordination: Coordination normal.  ?Psychiatric:     ?   Mood and Affect: Mood normal.     ?   Behavior: Behavior normal.  ?  ? ?Imaging: ?No results found. ?

## 2021-08-05 ENCOUNTER — Ambulatory Visit (INDEPENDENT_AMBULATORY_CARE_PROVIDER_SITE_OTHER): Payer: 59 | Admitting: Orthopaedic Surgery

## 2021-08-05 ENCOUNTER — Other Ambulatory Visit: Payer: Self-pay

## 2021-08-05 VITALS — Ht 67.0 in | Wt 266.0 lb

## 2021-08-05 DIAGNOSIS — M25532 Pain in left wrist: Secondary | ICD-10-CM

## 2021-08-05 DIAGNOSIS — R29898 Other symptoms and signs involving the musculoskeletal system: Secondary | ICD-10-CM

## 2021-08-05 NOTE — Progress Notes (Signed)
? ?Office Visit Note ?  ?Patient: Ashley [name]           ?Date of Birth: 20-Feb-1961           ?MRN: [id number] ?Visit Date: 08/05/2021 ?             ?Requested by: April Manson, NP ?[phone] ?[place],  [place] [address] ?PCP: April Manson, NP ? ? ?Assessment & Plan: ?Visit Diagnoses:  ?1. Left hand weakness   ?         Postop carpal tunnel release with mild to medium median nerve compression at the wrist. ? ?Plan: We will place in a wrist splint that she can use at night for her left hand.  Some of this may be aggravated after her back problems using her hands and arms to push herself up with her increased BMI.  Backs getting better she is moving better.  She can use a wrist splint off and on.  She can follow-up if she has persistent problems. ? ?Follow-Up Instructions: No follow-ups on file.  ? ?Orders:  ?No orders of the defined types were placed in this encounter. ? ?No orders of the defined types were placed in this encounter. ? ? ? ? Procedures: ?No procedures performed ? ? ?Clinical Data: ?No additional findings. ? ? ?Subjective: ?Chief Complaint  ?Patient presents with  ? Left Hand - Follow-up  ? ? ?HPI patient returns with continued left carpal tunnel symptoms in her hand.  Previous surgery left wrist 2013.  Opposite right hand was also done remotely but her right hand does not bother her. ?Patient had lumbar decompression at L4-5 with removal of intraspinal extradural facet cyst in December 2022.  She has been using her hand to push herself up to help change position help unload her back.  She states her back is doing well.  She has had EMGs nerve conduction velocities that shows mild to medium carpal tunnel syndrome on the left. ?Review of Systems updated unchanged ? ? ?Objective: ?Vital Signs: Ht 5\' 7"  (1.702 m)   Wt 266 lb (120.7 kg)   BMI 41.66 kg/m?  ? ?Physical Exam ?Constitutional:   ?   Appearance: She is well-developed.  ?HENT:  ?   Head: Normocephalic.  ?   Right Ear: External ear  normal.  ?   Left Ear: External ear normal. There is no impacted cerumen.  ?Eyes:  ?   Pupils: Pupils are equal, round, and reactive to light.  ?Neck:  ?   Thyroid: No thyromegaly.  ?   Trachea: No tracheal deviation.  ?Cardiovascular:  ?   Rate and Rhythm: Normal rate.  ?Pulmonary:  ?   Effort: Pulmonary effort is normal.  ?Abdominal:  ?   Palpations: Abdomen is soft.  ?Musculoskeletal:  ?   Cervical back: No rigidity.  ?Skin: ?   General: Skin is warm and dry.  ?Neurological:  ?   Mental Status: She is alert and oriented to person, place, and time.  ?Psychiatric:     ?   Behavior: Behavior normal.  ? ? ?Ortho Exam well-healed lumbar incision well-healed right and left carpal tunnel release incision.  Some discomfort with carpal compression left greater than right.  Ulnar nerve at the elbow is normal. ? ?Specialty Comments:  ?  ?Impression: ?The above electrodiagnostic study is ABNORMAL and reveals evidence of a mild to moderate left median nerve entrapment at the wrist (carpal tunnel syndrome) affecting sensory and motor  components.  ?  ?There is no significant electrodiagnostic evidence of any other focal nerve entrapment (in particular ulnar nerve), brachial plexopathy or cervical radiculopathy. ** As you know, this particular electrodiagnostic study cannot rule out chemical radiculitis or sensory only radiculopathy.  However, radiculopathy to the extent of causing atrophy or weakness should be able to be detected. ?  ? ?Imaging: ?No results found. ? ? ?PMFS History: ?Patient Active Problem List  ? Diagnosis Date Noted  ? Left hand weakness 07/15/2021  ? S/P lumbar fusion 05/16/2021  ? Lumbar stenosis 05/09/2021  ? Synovial cyst of lumbar facet joint 05/09/2021  ? Spinal stenosis of lumbar region 01/22/2021  ? Ganglion cyst of finger 09/21/2019  ? Edema 11/21/2018  ? Tremor 11/21/2018  ? Genital herpes simplex 11/17/2018  ? Primary osteoarthritis involving multiple joints 12/29/2017  ? S/P gastric bypass  12/29/2017  ? Gastroesophageal reflux disease without esophagitis 09/24/2017  ? Non-seasonal allergic rhinitis 09/24/2017  ? Acquired hallux interphalangeus 04/21/2017  ? Pain in toe of left foot 04/21/2017  ? Antibiotic-induced yeast infection 03/01/2017  ? Irritable bowel syndrome with both constipation and diarrhea 12/25/2016  ? Vitamin D deficiency 12/25/2016  ? Primary osteoarthritis of first carpometacarpal joint of right hand 06/15/2016  ? CTS (carpal tunnel syndrome) 07/02/2014  ? Essential tremor 03/23/2013  ? History of cholecystectomy 06/08/2011  ? Neuropathy 06/08/2011  ? ?Past Medical History:  ?Diagnosis Date  ? Essential tremor   ? GERD (gastroesophageal reflux disease)   ? History of cholecystectomy   ? Hypertension   ? Neuropathy   ? Palpitations   ? Seasonal allergies   ? Sinus infection   ?  ?Family History  ?Problem Relation Age of Onset  ? Colon cancer Neg Hx   ? Esophageal cancer Neg Hx   ? Rectal cancer Neg Hx   ? Stomach cancer Neg Hx   ?  ?Past Surgical History:  ?Procedure Laterality Date  ? CARPAL TUNNEL RELEASE Right 2005  ? left 2011  ? [place]  ? CHOLECYSTECTOMY  2008  ? CORRECTION OF HAMMER TOE REPAIR AND BUNION CORRECTION  2018  ? both feet  ? Hammer Toe Repair Bilateral 09/11/2016  ? #2 Toe Bilateral   ? HEEL SPUR SURGERY Left   ? history of neuroplasty decompression median nerve at carpal tunnel    ? LAPAROSCOPIC GASTRIC SLEEVE RESECTION WITH HIATAL HERNIA REPAIR  04/09/2015  ? Procedure: LAPAROSCOPIC GASTRIC SLEEVE RESECTION WITH HIATAL HERNIA REPAIR;  Surgeon: Excell Seltzer, MD;  Location: [facility];  Service: General;;  ? thumb joint replacement Bilateral   ? R- 03/02/17 & L- 07/21/16  ? UPPER GI ENDOSCOPY  04/09/2015  ? Procedure: UPPER GI ENDOSCOPY;  Surgeon: Excell Seltzer, MD;  Location: [facility];  Service: General;;  ? ?Social History  ? ?Occupational History  ? Occupation: Not currently working post-op  ?  Employer: [employer]  ?Tobacco Use  ? Smoking  status: Never  ? Smokeless tobacco: Never  ?Vaping Use  ? Vaping Use: Never used  ?Substance and Sexual Activity  ? Alcohol use: No  ? Drug use: No  ? Sexual activity: Not Currently  ? ? ? ? ? ? ?

## 2021-08-22 ENCOUNTER — Ambulatory Visit (INDEPENDENT_AMBULATORY_CARE_PROVIDER_SITE_OTHER): Payer: 59

## 2021-08-22 ENCOUNTER — Encounter: Payer: Self-pay | Admitting: Orthopaedic Surgery

## 2021-08-22 ENCOUNTER — Ambulatory Visit (INDEPENDENT_AMBULATORY_CARE_PROVIDER_SITE_OTHER): Payer: 59 | Admitting: Orthopaedic Surgery

## 2021-08-22 VITALS — BP 129/73 | HR 76 | Ht 67.0 in | Wt 270.0 lb

## 2021-08-22 DIAGNOSIS — Z981 Arthrodesis status: Secondary | ICD-10-CM | POA: Diagnosis not present

## 2021-08-22 NOTE — Progress Notes (Signed)
? ?Office Visit Note ?  ?Patient: Ashley [name]           ?Date of Birth: 1961/02/11           ?MRN: [id number] ?Visit Date: 08/22/2021 ?             ?Requested by: [name], NP ?[address] ?[place],  [place] [address] ?PCP: [name], NP ? ? ?Assessment & Plan: ?Visit Diagnoses:  ?1. S/P lumbar fusion   ? ?Plan:  Follow-up 6 to 9 monthsIf needed.  She will gradually increase her walking distance.  She can discontinue the lumbar brace.  She is back at work and is happy with results of surgery. ? ?Follow-Up Instructions: No follow-ups on file.  ? ?Orders:  ?Orders Placed This Encounter  ?Procedures  ? XR Lumbar Spine 2-3 Views  ? ?No orders of the defined types were placed in this encounter. ? ? ? ? Procedures: ?No procedures performed ? ? ?Clinical Data: ?No additional findings. ? ? ?Subjective: ?Chief Complaint  ?Patient presents with  ? Lower Back - Follow-up  ?  05/09/2021 Excision L4-5 intraspinal, extradural facet cyst, right TLIF  ? ? ?HPI follow-up lumbar fusion L4-5 interbody.  She states at the end of the day she has some right posterior thigh pain and discomfort.  She has problems when she sleeps on her right side.  Facet cyst was on the right and TLIF was placed on the right side with removal of the facet.  She is walking better.  Off pain medication and is back at work. ? ?Review of Systems updated unchanged ? ? ?Objective: ?Vital Signs: BP 129/73   Pulse 76   Ht 5\' 7"  (1.702 m)   Wt 270 lb (122.5 kg)   BMI 42.29 kg/m?  ? ?Physical Exam ?Constitutional:   ?   Appearance: She is well-developed.  ?HENT:  ?   Head: Normocephalic.  ?   Right Ear: External ear normal.  ?   Left Ear: External ear normal. There is no impacted cerumen.  ?Eyes:  ?   Pupils: Pupils are equal, round, and reactive to light.  ?Neck:  ?   Thyroid: No thyromegaly.  ?   Trachea: No tracheal deviation.  ?Cardiovascular:  ?   Rate and Rhythm: Normal rate.  ?Pulmonary:  ?   Effort: Pulmonary effort is normal.   ?Abdominal:  ?   Palpations: Abdomen is soft.  ?Musculoskeletal:  ?   Cervical back: No rigidity.  ?Skin: ?   General: Skin is warm and dry.  ?Neurological:  ?   Mental Status: She is alert and oriented to person, place, and time.  ?Psychiatric:     ?   Behavior: Behavior normal.  ? ? ?Ortho Exam negative straight leg raising normal heel toe gait.  Lumbar incisions well-healed no rash of exposed skin. ? ?Specialty Comments:  ?No specialty comments available. ? ?Imaging: ?No results found. ? ? ?PMFS History: ?Patient Active Problem List  ? Diagnosis Date Noted  ? Left hand weakness 07/15/2021  ? S/P lumbar fusion 05/16/2021  ? Lumbar stenosis 05/09/2021  ? Synovial cyst of lumbar facet joint 05/09/2021  ? Spinal stenosis of lumbar region 01/22/2021  ? Ganglion cyst of finger 09/21/2019  ? Edema 11/21/2018  ? Tremor 11/21/2018  ? Genital herpes simplex 11/17/2018  ? Primary osteoarthritis involving multiple joints 12/29/2017  ? S/P gastric bypass 12/29/2017  ? Gastroesophageal reflux disease without esophagitis 09/24/2017  ? Non-seasonal  allergic rhinitis 09/24/2017  ? Acquired hallux interphalangeus 04/21/2017  ? Pain in toe of left foot 04/21/2017  ? Antibiotic-induced yeast infection 03/01/2017  ? Irritable bowel syndrome with both constipation and diarrhea 12/25/2016  ? Vitamin D deficiency 12/25/2016  ? Primary osteoarthritis of first carpometacarpal joint of right hand 06/15/2016  ? CTS (carpal tunnel syndrome) 07/02/2014  ? Essential tremor 03/23/2013  ? History of cholecystectomy 06/08/2011  ? Neuropathy 06/08/2011  ? ?Past Medical History:  ?Diagnosis Date  ? Essential tremor   ? GERD (gastroesophageal reflux disease)   ? History of cholecystectomy   ? Hypertension   ? Neuropathy   ? Palpitations   ? Seasonal allergies   ? Sinus infection   ?  ?Family History  ?Problem Relation Age of Onset  ? Colon cancer Neg Hx   ? Esophageal cancer Neg Hx   ? Rectal cancer Neg Hx   ? Stomach cancer Neg Hx   ?  ?Past  Surgical History:  ?Procedure Laterality Date  ? CARPAL TUNNEL RELEASE Right 2005  ? left 2011  ? [place]  ? CHOLECYSTECTOMY  2008  ? CORRECTION OF HAMMER TOE REPAIR AND BUNION CORRECTION  2018  ? both feet  ? Hammer Toe Repair Bilateral 09/11/2016  ? #2 Toe Bilateral   ? HEEL SPUR SURGERY Left   ? history of neuroplasty decompression median nerve at carpal tunnel    ? LAPAROSCOPIC GASTRIC SLEEVE RESECTION WITH HIATAL HERNIA REPAIR  04/09/2015  ? Procedure: LAPAROSCOPIC GASTRIC SLEEVE RESECTION WITH HIATAL HERNIA REPAIR;  Surgeon: Excell Seltzer, MD;  Location: [facility];  Service: General;;  ? thumb joint replacement Bilateral   ? R- 03/02/17 & L- 07/21/16  ? UPPER GI ENDOSCOPY  04/09/2015  ? Procedure: UPPER GI ENDOSCOPY;  Surgeon: Excell Seltzer, MD;  Location: [facility];  Service: General;;  ? ?Social History  ? ?Occupational History  ? Occupation: Not currently working post-op  ?  Employer: [employer]  ?Tobacco Use  ? Smoking status: Never  ? Smokeless tobacco: Never  ?Vaping Use  ? Vaping Use: Never used  ?Substance and Sexual Activity  ? Alcohol use: No  ? Drug use: No  ? Sexual activity: Not Currently  ? ? ? ? ? ? ?

## 2021-09-04 ENCOUNTER — Encounter: Payer: Self-pay | Admitting: Podiatry

## 2021-10-07 ENCOUNTER — Encounter: Payer: Self-pay | Admitting: Orthopaedic Surgery

## 2021-10-09 ENCOUNTER — Ambulatory Visit: Payer: 59 | Admitting: Surgery

## 2021-10-14 ENCOUNTER — Ambulatory Visit (INDEPENDENT_AMBULATORY_CARE_PROVIDER_SITE_OTHER): Payer: 59

## 2021-10-14 ENCOUNTER — Encounter: Payer: Self-pay | Admitting: Orthopaedic Surgery

## 2021-10-14 ENCOUNTER — Ambulatory Visit: Payer: 59 | Admitting: Orthopaedic Surgery

## 2021-10-14 VITALS — BP 140/72 | HR 59 | Ht 67.0 in | Wt 270.0 lb

## 2021-10-14 DIAGNOSIS — M542 Cervicalgia: Secondary | ICD-10-CM | POA: Diagnosis not present

## 2021-10-14 DIAGNOSIS — M4722 Other spondylosis with radiculopathy, cervical region: Secondary | ICD-10-CM | POA: Diagnosis not present

## 2021-10-14 NOTE — Progress Notes (Unsigned)
Office Visit Note   Patient: Ashley [name]           Date of Birth: 05-28-1960           MRN: [id number] Visit Date: 10/14/2021              Requested by: April Manson, NP [address],  [place] [address] PCP: April Manson, NP   Assessment & Plan: Visit Diagnoses:  1. Neck pain   2. Other spondylosis with radiculopathy, cervical region     Plan: We will set up for some physical therapy for treatment of her spondylosis.  Recheck 5 weeks if she is having persistent problems then we will consider diagnostic MRI imaging of the cervical spine.  Discussed that I do not think this is carpal tunnel but is likely coming from C6-7 spondylosis in her neck with radiculopathy.  Follow-Up Instructions: Return in about 5 weeks (around 11/18/2021).   Orders:  Orders Placed This Encounter  Procedures   XR Cervical Spine 2 or 3 views   No orders of the defined types were placed in this encounter.     Procedures: No procedures performed   Clinical Data: No additional findings.   Subjective: Chief Complaint  Patient presents with   Left Hand - Pain    HPI 61 year old female with called about coming in for carpal tunnel injection.  She is already had left carpal tunnel release 2013 and her pain is primarily been on the ulnar side of the hand and some associated neck discomfort.  She is not really having any discomfort on the radial 3 fingers.  Review of Systems previous lumbar fusion gastric bypass history of neuropathy.  History of carpal tunnel releases.   Objective: Vital Signs: BP 140/72   Pulse (!) 59   Ht 5\' 7"  (1.702 m)   Wt 270 lb (122.5 kg)   BMI 42.29 kg/m   Physical Exam Constitutional:      Appearance: She is well-developed.  HENT:     Head: Normocephalic.     Right Ear: External ear normal.     Left Ear: External ear normal. There is no impacted cerumen.  Eyes:     Pupils: Pupils are equal, round, and reactive to light.  Neck:      Thyroid: No thyromegaly.     Trachea: No tracheal deviation.  Cardiovascular:     Rate and Rhythm: Normal rate.  Pulmonary:     Effort: Pulmonary effort is normal.  Abdominal:     Palpations: Abdomen is soft.  Musculoskeletal:     Cervical back: No rigidity.  Skin:    General: Skin is warm and dry.  Neurological:     Mental Status: She is alert and oriented to person, place, and time.  Psychiatric:        Behavior: Behavior normal.    Ortho Exam patient has brachial plexus tenderness on the left side.  Positive Spurling left negative on the right.  She has slight triceps weakness on the left trace wrist flexion weakness.  Interossei are strong no first dorsal interosseous or abductor digiti quinti atrophy.  Specialty Comments:  No specialty comments available.  Imaging: AP lateral cervical spine images are obtained and reviewed.  Trace  calcification anterior longitudinal ligament at C5-6.  Disc base narrowing  and spurring noted at C6-7.   Impression: Mid cervical spondylosis most notable at C6-7 and then C5-6.    Negative for acute bone changes.  PMFS History: Patient Active Problem List   Diagnosis Date Noted   Other spondylosis with radiculopathy, cervical region 10/15/2021   Left hand weakness 07/15/2021   S/P lumbar fusion 05/16/2021   Lumbar stenosis 05/09/2021   Synovial cyst of lumbar facet joint 05/09/2021   Spinal stenosis of lumbar region 01/22/2021   Ganglion cyst of finger 09/21/2019   Edema 11/21/2018   Tremor 11/21/2018   Genital herpes simplex 11/17/2018   Primary osteoarthritis involving multiple joints 12/29/2017   S/P gastric bypass 12/29/2017   Gastroesophageal reflux disease without esophagitis 09/24/2017   Non-seasonal allergic rhinitis 09/24/2017   Acquired hallux interphalangeus 04/21/2017   Pain in toe of left foot 04/21/2017   Antibiotic-induced yeast infection 03/01/2017   Irritable bowel syndrome with both constipation and diarrhea  12/25/2016   Vitamin D deficiency 12/25/2016   Primary osteoarthritis of first carpometacarpal joint of right hand 06/15/2016   CTS (carpal tunnel syndrome) 07/02/2014   Essential tremor 03/23/2013   History of cholecystectomy 06/08/2011   Neuropathy 06/08/2011   Past Medical History:  Diagnosis Date   Essential tremor    GERD (gastroesophageal reflux disease)    History of cholecystectomy    Hypertension    Neuropathy    Palpitations    Seasonal allergies    Sinus infection     Family History  Problem Relation Age of Onset   Colon cancer Neg Hx    Esophageal cancer Neg Hx    Rectal cancer Neg Hx    Stomach cancer Neg Hx     Past Surgical History:  Procedure Laterality Date   CARPAL TUNNEL RELEASE Right 2005   left 2011   CESAREAN SECTION  1989   CHOLECYSTECTOMY  2008   CORRECTION OF HAMMER TOE REPAIR AND BUNION CORRECTION  2018   both feet   Hammer Toe Repair Bilateral 09/11/2016   #2 Toe Bilateral    HEEL SPUR SURGERY Left    history of neuroplasty decompression median nerve at carpal tunnel     LAPAROSCOPIC GASTRIC SLEEVE RESECTION WITH HIATAL HERNIA REPAIR  04/09/2015   Procedure: LAPAROSCOPIC GASTRIC SLEEVE RESECTION WITH HIATAL HERNIA REPAIR;  Surgeon: Glenna Fellows, MD;  Location: [facility];  Service: General;;   thumb joint replacement Bilateral    R- 03/02/17 & L- 07/21/16   UPPER GI ENDOSCOPY  04/09/2015   Procedure: UPPER GI ENDOSCOPY;  Surgeon: Glenna Fellows, MD;  Location: [facility];  Service: General;;   Social History   Occupational History   Occupation: Not currently working post-op    Employer: [employer]  Tobacco Use   Smoking status: Never   Smokeless tobacco: Never  Vaping Use   Vaping Use: Never used  Substance and Sexual Activity   Alcohol use: No   Drug use: No   Sexual activity: Not Currently

## 2021-10-15 DIAGNOSIS — M4722 Other spondylosis with radiculopathy, cervical region: Secondary | ICD-10-CM | POA: Insufficient documentation

## 2021-11-18 ENCOUNTER — Ambulatory Visit: Payer: 59 | Admitting: Orthopaedic Surgery

## 2022-01-25 ENCOUNTER — Other Ambulatory Visit: Payer: Self-pay | Admitting: Orthopaedic Surgery

## 2022-02-26 DIAGNOSIS — Z8249 Family history of ischemic heart disease and other diseases of the circulatory system: Secondary | ICD-10-CM | POA: Insufficient documentation

## 2022-02-26 DIAGNOSIS — R0989 Other specified symptoms and signs involving the circulatory and respiratory systems: Secondary | ICD-10-CM | POA: Insufficient documentation

## 2022-02-26 DIAGNOSIS — R002 Palpitations: Secondary | ICD-10-CM | POA: Insufficient documentation

## 2022-03-12 DIAGNOSIS — T2122XA Burn of second degree of abdominal wall, initial encounter: Secondary | ICD-10-CM | POA: Insufficient documentation

## 2022-03-20 ENCOUNTER — Other Ambulatory Visit: Payer: Self-pay | Admitting: Orthopaedic Surgery

## 2022-03-20 ENCOUNTER — Encounter: Payer: Self-pay | Admitting: Orthopaedic Surgery

## 2022-03-20 MED ORDER — GABAPENTIN 300 MG PO CAPS
300.0000 mg | ORAL_CAPSULE | Freq: Every day | ORAL | 1 refills | Status: DC
Start: 2022-03-20 — End: 2023-02-17

## 2022-03-20 NOTE — Progress Notes (Signed)
Neurontin 300mg   one po tid # 270 with refill times one for 6 month supply sent to [facility] per pt request.

## 2022-03-25 NOTE — Telephone Encounter (Signed)
Unsure why first note states unshared, likely error.

## 2022-04-07 ENCOUNTER — Ambulatory Visit (INDEPENDENT_AMBULATORY_CARE_PROVIDER_SITE_OTHER): Payer: 59

## 2022-04-07 ENCOUNTER — Encounter: Payer: Self-pay | Admitting: Orthopaedic Surgery

## 2022-04-07 ENCOUNTER — Ambulatory Visit: Payer: 59 | Admitting: Orthopaedic Surgery

## 2022-04-07 VITALS — BP 113/74 | HR 55 | Ht 67.0 in | Wt 274.0 lb

## 2022-04-07 DIAGNOSIS — M79605 Pain in left leg: Secondary | ICD-10-CM

## 2022-04-07 DIAGNOSIS — Z981 Arthrodesis status: Secondary | ICD-10-CM

## 2022-04-07 DIAGNOSIS — M7138 Other bursal cyst, other site: Secondary | ICD-10-CM | POA: Diagnosis not present

## 2022-04-07 DIAGNOSIS — M79604 Pain in right leg: Secondary | ICD-10-CM | POA: Diagnosis not present

## 2022-04-07 NOTE — Progress Notes (Signed)
Office Visit Note   Patient: Ashley [name]           Date of Birth: [birth year]           MRN: [id number] Visit Date: 04/07/2022              Requested by: April Manson, NP [address]  [place] [address] PCP: April Manson, NP   Assessment & Plan: Visit Diagnoses:  1. Bilateral leg pain   2. Synovial cyst of lumbar facet joint   3. S/P lumbar fusion     Plan: Encouraged her to work on some weight loss to unload her spine.  She will call if she like to proceed with some physical therapy.  Continue gabapentin.  If therapy is ineffective we can consider trochanteric injection or reimaging to evaluate the L3-4 level above her fusion site.  She will call if she would like to try the therapy.  Follow-Up Instructions: Return if symptoms worsen or fail to improve.   Orders:  Orders Placed This Encounter  Procedures   XR Lumbar Spine 2-3 Views   No orders of the defined types were placed in this encounter.     Procedures: No procedures performed   Clinical Data: No additional findings.   Subjective: Chief Complaint  Patient presents with   Right Leg - Pain   Left Leg - Pain    HPI 61 year old female post single level L4-5 fusion 05/09/2021 removal of right intraspinal extradural facet cyst.  Preop she had right leg pain as she is having some pain in both hips that radiates into her thigh stops at the knees.  She does take gabapentin.  She denies numbness down to her feet.  She has only mild back pain.  MRI scan before fusion showed some mild to moderate narrowing at L3-4.  She had some facet changes at L5-S1 with moderate subarticular recess narrowing on the left at L3-4 and bilaterally at L5-S1.  No bowel or bladder symptoms.  She has continued to lose weight gradually BMI is currently 42.  Patient's BMI in 2021 was 36.  Review of Systems all the systems noncontributory to HPI.  Previous gastric bypass.  Carpal tunnel.   Objective: Vital Signs: BP  113/74   Pulse (!) 55   Ht 5\' 7"  (1.702 m)   Wt 274 lb (124.3 kg)   BMI 42.91 kg/m   Physical Exam Constitutional:      Appearance: She is well-developed.  HENT:     Head: Normocephalic.     Right Ear: External ear normal.     Left Ear: External ear normal. There is no impacted cerumen.  Eyes:     Pupils: Pupils are equal, round, and reactive to light.  Neck:     Thyroid: No thyromegaly.     Trachea: No tracheal deviation.  Cardiovascular:     Rate and Rhythm: Normal rate.  Pulmonary:     Effort: Pulmonary effort is normal.  Abdominal:     Palpations: Abdomen is soft.  Musculoskeletal:     Cervical back: No rigidity.  Skin:    General: Skin is warm and dry.  Neurological:     Mental Status: She is alert and oriented to person, place, and time.  Psychiatric:        Behavior: Behavior normal.     Ortho Exam lumbar incisions well-healed.  She has no sciatic notch tenderness some mild to moderate trochanteric bursal tenderness with  slight bilateral Trendelenburg gait.  No pain with logrolling of the hips internal/external rotation 30 to 40 degrees both hips.  Negative FABER test.  Specialty Comments:  No specialty comments available.  Imaging: XR Lumbar Spine 2-3 Views  Result Date: 04/07/2022 AP lateral lumbar spine images are obtained and reviewed this shows single level instrumented fusion L4-5.  Comparison to December 2022 images show some progressive graft thickening interbody.  No loosening of screws.  No adjacent level disc space narrowing progression. Impression: Solid single level instrumented fusion 10 months postop satisfactory position and alignment.    PMFS History: Patient Active Problem List   Diagnosis Date Noted   Other spondylosis with radiculopathy, cervical region 10/15/2021   Left hand weakness 07/15/2021   S/P lumbar fusion 05/16/2021   Lumbar stenosis 05/09/2021   Synovial cyst of lumbar facet joint 05/09/2021   Spinal stenosis of lumbar  region 01/22/2021   Ganglion cyst of finger 09/21/2019   Edema 11/21/2018   Tremor 11/21/2018   Genital herpes simplex 11/17/2018   Primary osteoarthritis involving multiple joints 12/29/2017   S/P gastric bypass 12/29/2017   Gastroesophageal reflux disease without esophagitis 09/24/2017   Non-seasonal allergic rhinitis 09/24/2017   Acquired hallux interphalangeus 04/21/2017   Pain in toe of left foot 04/21/2017   Antibiotic-induced yeast infection 03/01/2017   Irritable bowel syndrome with both constipation and diarrhea 12/25/2016   Vitamin D deficiency 12/25/2016   Primary osteoarthritis of first carpometacarpal joint of right hand 06/15/2016   CTS (carpal tunnel syndrome) 07/02/2014   Essential tremor 03/23/2013   History of cholecystectomy 06/08/2011   Neuropathy 06/08/2011   Past Medical History:  Diagnosis Date   Essential tremor    GERD (gastroesophageal reflux disease)    History of cholecystectomy    Hypertension    Neuropathy    Palpitations    Seasonal allergies    Sinus infection     Family History  Problem Relation Age of Onset   Colon cancer Neg Hx    Esophageal cancer Neg Hx    Rectal cancer Neg Hx    Stomach cancer Neg Hx     Past Surgical History:  Procedure Laterality Date   CARPAL TUNNEL RELEASE Right 2005   left 2011   CESAREAN SECTION  1989   CHOLECYSTECTOMY  2008   CORRECTION OF HAMMER TOE REPAIR AND BUNION CORRECTION  2018   both feet   Hammer Toe Repair Bilateral 09/11/2016   #2 Toe Bilateral    HEEL SPUR SURGERY Left    history of neuroplasty decompression median nerve at carpal tunnel     LAPAROSCOPIC GASTRIC SLEEVE RESECTION WITH HIATAL HERNIA REPAIR  04/09/2015   Procedure: LAPAROSCOPIC GASTRIC SLEEVE RESECTION WITH HIATAL HERNIA REPAIR;  Surgeon: Glenna Fellows, MD;  Location: [facility];  Service: General;;   thumb joint replacement Bilateral    R- 03/02/17 & L- 07/21/16   UPPER GI ENDOSCOPY  04/09/2015   Procedure: UPPER GI ENDOSCOPY;   Surgeon: Glenna Fellows, MD;  Location: [facility];  Service: General;;   Social History   Occupational History   Occupation: Not currently working post-op    Employer: [employer]  Tobacco Use   Smoking status: Never   Smokeless tobacco: Never  Vaping Use   Vaping Use: Never used  Substance and Sexual Activity   Alcohol use: No   Drug use: No   Sexual activity: Not Currently

## 2022-04-08 ENCOUNTER — Ambulatory Visit: Payer: 59 | Admitting: Orthopaedic Surgery

## 2022-07-07 ENCOUNTER — Encounter: Payer: Self-pay | Admitting: Podiatry

## 2022-07-07 ENCOUNTER — Ambulatory Visit: Payer: 59 | Admitting: Podiatry

## 2022-07-07 ENCOUNTER — Ambulatory Visit (INDEPENDENT_AMBULATORY_CARE_PROVIDER_SITE_OTHER): Payer: 59

## 2022-07-07 DIAGNOSIS — M778 Other enthesopathies, not elsewhere classified: Secondary | ICD-10-CM

## 2022-07-07 DIAGNOSIS — M2012 Hallux valgus (acquired), left foot: Secondary | ICD-10-CM | POA: Diagnosis not present

## 2022-07-07 DIAGNOSIS — M2042 Other hammer toe(s) (acquired), left foot: Secondary | ICD-10-CM

## 2022-07-07 NOTE — Progress Notes (Signed)
She presents today complaining of both feet being painful.  She states that her left foot has been painful for quite some time now and her third toe is really starting to undercut the second toe.  She states that she has had multiple surgeries which have left some numbness across both feet.  States that she would like to get rid of the painful bunion to the first metatarsal phalangeal joint of the left foot and see what she can do to her toes.  She states that this is affecting her ability perform her daily activities she states that it was a inhibiting her from healing from her back surgery at 1 time.  She has tried different shoe gear states that she only has 1 pair shoes that she can wear comfortably.  This is affecting her ability to perform her daily activities and maintain her general good health.  Objective: Vital signs are stable she is oriented x 3.  Denies any changes to her past medical history medications and allergies she has had back surgery without L4-5 fusion with Dr. [name].  Pulses are palpable.  She is still overweight.  She is working on getting that down with a [employer] plan.  She has a rigid fusions at the hallux interphalangeal joint bilateral and the PIPJ's of the second digit bilateral she has hallux abductovalgus deformity with increase in the first intermetatarsal angle bilaterally greater than 10 degrees.  Hallux abductus angle is greater than 23 degrees on the left foot and relatively normal on the scale.  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  Right foot does demonstrate mild to moderate bunion deformity with increased greater than 9 degrees to the first intermetatarsal space with a hypertrophic medial condyle.  This is painful on palpation as well as range of motion bilaterally.  Radiographs taken today demonstrate osseously mature foot with mild demineralized bone.  She has retention of  the screw to the second metatarsal bilaterally with osteoarthritic changes at the metatarsal phalangeal joint.  She also has some screws to the hallux with a fusion of the interphalangeal joints with retained staples medially of the proximal phalanx.  Increase in first intermetatarsal angle is greater than normal values is present.  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  Osteoarthritic changes to toes #3 #4 #5 in the left foot #5 in the right foot.  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  #5 the right foot.  Hallux valgus deformity right foot.  Plan: Discussed etiology pathology conservative surgical therapies at this point were going to consent her for an [facility] type bunion repair with screw fixation first metatarsophalangeal joint left foot.  We are going to go ahead and performed hammertoe repair to toes #3 #4 with internal fixation either K wire or screws.  Derotational arthroplasty fifth digit.  Will start with the left foot first and then moved to the right foot partially.  Healing.  At this point we consented her for this procedure she understands this is amenable to achievement of this type surgery for she understands the possible postop complications which may include her limited to postop pain with some infection recurrence need for further surgery overcorrection under correction also digit loss limb loss of life.  Will follow-up with her in the near future for surgical intervention.

## 2022-07-08 ENCOUNTER — Encounter: Payer: Self-pay | Admitting: Podiatry

## 2022-07-13 ENCOUNTER — Telehealth: Payer: Self-pay | Admitting: Urology

## 2022-07-13 NOTE — Telephone Encounter (Signed)
DOS - 08/07/22  [place] BUNIONECTOMY LEFT --- ID:[id number] HAMMERTOE REPAIR 3-5 LEFT --- [id number]  [facility] EFFECTIVE DATE - 05/25/22  PLAN DEDUCTIBLE - $500.00 W/ Remaining: $500.00 OOP - $2,000.00 W/ Remaining: $1,930.00 COINSURANCE - 20 %  PER [facility] WEBSITE FOR CPT CODES 09811 AND 91478 HAVE BEEN APPROVED, AUTH # [id number], GOOD FROM 08/07/22 - 11/08/22.

## 2022-07-21 ENCOUNTER — Encounter: Payer: Self-pay | Admitting: Podiatry

## 2022-07-23 ENCOUNTER — Encounter: Payer: Self-pay | Admitting: Podiatry

## 2022-07-23 ENCOUNTER — Encounter: Payer: Self-pay | Admitting: Radiology

## 2022-08-05 ENCOUNTER — Encounter: Payer: Self-pay | Admitting: Podiatry

## 2022-08-05 ENCOUNTER — Other Ambulatory Visit: Payer: Self-pay | Admitting: Podiatry

## 2022-08-05 MED ORDER — ONDANSETRON HCL 4 MG PO TABS: 4.0000 mg | ORAL_TABLET | Freq: Three times a day (TID) | ORAL | 0 refills | Status: DC | PRN

## 2022-08-05 MED ORDER — OXYCODONE-ACETAMINOPHEN 10-325 MG PO TABS: 1.0000 | ORAL_TABLET | Freq: Three times a day (TID) | ORAL | 0 refills | Status: AC | PRN

## 2022-08-05 MED ORDER — CEPHALEXIN 500 MG PO CAPS: 500.0000 mg | ORAL_CAPSULE | Freq: Three times a day (TID) | ORAL | 0 refills | Status: DC

## 2022-08-06 ENCOUNTER — Encounter: Payer: Self-pay | Admitting: Podiatry

## 2022-08-07 DIAGNOSIS — M2042 Other hammer toe(s) (acquired), left foot: Secondary | ICD-10-CM | POA: Diagnosis not present

## 2022-08-07 DIAGNOSIS — M2012 Hallux valgus (acquired), left foot: Secondary | ICD-10-CM | POA: Diagnosis not present

## 2022-08-13 ENCOUNTER — Encounter: Payer: Self-pay | Admitting: Podiatry

## 2022-08-13 ENCOUNTER — Ambulatory Visit (INDEPENDENT_AMBULATORY_CARE_PROVIDER_SITE_OTHER): Payer: 59

## 2022-08-13 ENCOUNTER — Ambulatory Visit (INDEPENDENT_AMBULATORY_CARE_PROVIDER_SITE_OTHER): Payer: 59 | Admitting: Podiatry

## 2022-08-13 DIAGNOSIS — M2012 Hallux valgus (acquired), left foot: Secondary | ICD-10-CM

## 2022-08-13 DIAGNOSIS — Z9889 Other specified postprocedural states: Secondary | ICD-10-CM

## 2022-08-13 DIAGNOSIS — M2042 Other hammer toe(s) (acquired), left foot: Secondary | ICD-10-CM

## 2022-08-13 MED ORDER — OXYCODONE-ACETAMINOPHEN 10-325 MG PO TABS
1.0000 | ORAL_TABLET | Freq: Three times a day (TID) | ORAL | 0 refills | Status: AC | PRN
Start: 2022-08-13 — End: 2022-08-20

## 2022-08-13 NOTE — Progress Notes (Signed)
She presents today for her first postop visit date of surgery 08/07/2018 for left foot [place] bunionectomy with screw fixation hammertoe repair #3 #4 with K wires and a derotational arthroplasty fifth digit.  She states that is been quite sore and it throbs some.  She states that she has used up all of her pain medication at this time.  Objective: Vital signs are stable alert and oriented x 3.  Pulses are palpable.  There is some mild erythema moderate edema no cellulitis drainage or odor appears to be a bruise where the dressing had gotten tight due to the swelling.  But the sutures are intact margins are well coapted she has good range of motion of all the metatarsophalangeal joints.  Radiographs today demonstrate capital osteotomy in good position with double screw fixation.  Hammertoes are in good position with K wires intact.  Assessment: Well-healing surgical foot.  Plan: Redressed today dressed a compressive dressing follow-up with her in 2 weeks for suture removal hopefully at that time.  I did refill her pain medication 1 last time.

## 2022-08-14 ENCOUNTER — Encounter: Payer: Self-pay | Admitting: Podiatry

## 2022-08-27 ENCOUNTER — Ambulatory Visit (INDEPENDENT_AMBULATORY_CARE_PROVIDER_SITE_OTHER): Payer: 59

## 2022-08-27 ENCOUNTER — Ambulatory Visit (INDEPENDENT_AMBULATORY_CARE_PROVIDER_SITE_OTHER): Payer: 59 | Admitting: Podiatry

## 2022-08-27 DIAGNOSIS — M2042 Other hammer toe(s) (acquired), left foot: Secondary | ICD-10-CM

## 2022-08-27 DIAGNOSIS — M2012 Hallux valgus (acquired), left foot: Secondary | ICD-10-CM

## 2022-08-27 DIAGNOSIS — Z9889 Other specified postprocedural states: Secondary | ICD-10-CM

## 2022-08-28 NOTE — Progress Notes (Signed)
She presents today postop visit #2 date of surgery 08/07/2022 left foot [place] bunionectomy with screw fixation hammertoe repair #3 #4 with pins and derotational fifth digit.  States that the fifth toe feels a little crunched up with the dressing but otherwise feels pretty good.  Denies fever chills nausea or muscle aches pains calf pain back pain chest pain shortness of breath.  Objective: Vital signs are stable alert and oriented x 3.  Pulses are palpable.  Mild erythema no edema cellulitis drainage or odor sutures are intact once removed today margins remain well coapted.  Toes are rectus K wires are in place.  Radiographs taken today demonstrate well-healing first metatarsal osteotomy is well as hammertoe repairs.  Assessment: Well-healing surgical foot.  Plan: Sutures were removed today dry sterile dressing was applied.  Will continue the use of Darco shoe and cam boot.  We would like for her to continue to keep these K wires dry and clean.  I will follow-up with her in 2 to 3 weeks.  Another set of x-rays will be necessary at that time

## 2022-09-15 ENCOUNTER — Encounter: Payer: 59 | Admitting: Podiatry

## 2022-09-22 ENCOUNTER — Ambulatory Visit (INDEPENDENT_AMBULATORY_CARE_PROVIDER_SITE_OTHER): Payer: 59 | Admitting: Podiatry

## 2022-09-22 ENCOUNTER — Encounter: Payer: Self-pay | Admitting: Podiatry

## 2022-09-22 ENCOUNTER — Ambulatory Visit (INDEPENDENT_AMBULATORY_CARE_PROVIDER_SITE_OTHER): Payer: 59

## 2022-09-22 DIAGNOSIS — M2042 Other hammer toe(s) (acquired), left foot: Secondary | ICD-10-CM

## 2022-09-22 DIAGNOSIS — M2012 Hallux valgus (acquired), left foot: Secondary | ICD-10-CM

## 2022-09-22 DIAGNOSIS — Z9889 Other specified postprocedural states: Secondary | ICD-10-CM

## 2022-09-22 NOTE — Progress Notes (Signed)
She presents today for postop visit date of surgery 08/07/2022 she is status post [facility] bunion repair screw fixation and hammertoe repair to toes #3 and #4 of the left foot.  K wires still intact states that she has been on her foot a lot and her foot is swollen.  Objective: Vital signs stable she alert oriented x 3.  Pulses are palpable.  She does have some pitting edema to the dorsum of the foot radiographs taken today do not demonstrate any fractures K wires are in place and the fusion sites appear to be healing nicely.  Assessment: Well-healing surgical toes and forefoot.  Plan: Removal of the K wires today I am going to follow-up with her in about 1 month for another set of x-rays.

## 2022-10-13 ENCOUNTER — Encounter: Payer: 59 | Admitting: Podiatry

## 2022-10-15 ENCOUNTER — Encounter: Payer: Self-pay | Admitting: Podiatry

## 2022-10-15 ENCOUNTER — Ambulatory Visit (INDEPENDENT_AMBULATORY_CARE_PROVIDER_SITE_OTHER): Payer: 59 | Admitting: Podiatry

## 2022-10-15 ENCOUNTER — Ambulatory Visit (INDEPENDENT_AMBULATORY_CARE_PROVIDER_SITE_OTHER): Payer: 59

## 2022-10-15 DIAGNOSIS — M2031 Hallux varus (acquired), right foot: Secondary | ICD-10-CM

## 2022-10-15 DIAGNOSIS — M2012 Hallux valgus (acquired), left foot: Secondary | ICD-10-CM

## 2022-10-15 NOTE — Progress Notes (Signed)
She presents today for follow-up of her bilateral feet.  States that the right foot is starting to become more more painful right here she refers to the first metatarsophalangeal joint of the right foot and fifth digit right foot.  Left foot she states that seems to be doing pretty good.  Objective: Vital signs stable and oriented x 3 left foot demonstrates well-healing hammertoe deformities will be removed pins last time but decrease in edema from previous evaluation.  She has great range of motion.  Right foot does demonstrate hallux valgus deformity or at least a very prominent hypertrophic medial condyle of the fifth metatarsal.  Adductovarus rotated hammertoe deformity fifth right.  Radiographs taken today demonstrate well-healing osteotomies bilateral hallux valgus deformity right and hammertoe fifth right.  Assessment: Well-healing surgical foot left.  Hallux valgus deformity right hammertoe deformity fifth right.  Plan: Discussed etiology pathology and surgical therapies consented her today for a [facility] bunion repair and derotational arthroplasty fifth digit right foot reviewed past medical history medications allergies once again also consented her and went over her possible postop complications which may include but not limited to postop pain bleeding swell infection recurrence need for further surgery overcorrection under correction loss of digit loss limb loss of life.  Will follow-up with her in the near future for surgical intervention.

## 2022-10-21 ENCOUNTER — Encounter: Payer: Self-pay | Admitting: Podiatry

## 2022-10-22 ENCOUNTER — Encounter: Payer: Self-pay | Admitting: Podiatry

## 2022-10-22 ENCOUNTER — Telehealth: Payer: Self-pay | Admitting: Urology

## 2022-10-22 NOTE — Telephone Encounter (Signed)
DOS - 11/20/22  MCBRIDE BUNION REPAIR RIGHT --- [id number] HAMMERTOE REPAIR 5TH RIGHT --- [id number]  [facility] EFFECTIVE DATE - 05/25/22  DEDUCTIBLE - $500.00 W/ $0.00 REMAINING OOP - $2,000.00 W/ $0.00 REMAINING COINSURANCE - 20%   PER [employer] WEBSITE FOR CPT CODES 09811 AND 28285 HAVE BEEN APPROVED, AUTH # [id number], GOOD FROM 11/20/22 - 02/18/23.

## 2022-10-30 ENCOUNTER — Encounter: Payer: Self-pay | Admitting: Podiatry

## 2022-10-30 ENCOUNTER — Telehealth: Payer: Self-pay | Admitting: Podiatry

## 2022-10-30 NOTE — Telephone Encounter (Signed)
Pt called asking to talk with Ashley [name] about a note. She stated that she did write a note but her job is stating there are 2 return dates and they need one that has the final return to work date. She requested the note to say her return to work date was 8.18.2024 due to her previous left foot surgery and upcoming right foot surgery.she would like it emailed to her at [email or web address]  I typed it out and emailed to pt.

## 2022-11-02 ENCOUNTER — Encounter: Payer: Self-pay | Admitting: Podiatry

## 2022-11-18 ENCOUNTER — Other Ambulatory Visit: Payer: Self-pay | Admitting: Podiatry

## 2022-11-18 MED ORDER — OXYCODONE-ACETAMINOPHEN 10-325 MG PO TABS
1.0000 | ORAL_TABLET | Freq: Three times a day (TID) | ORAL | 0 refills | Status: AC | PRN
Start: 2022-11-18 — End: 2022-11-25

## 2022-11-18 MED ORDER — ONDANSETRON HCL 4 MG PO TABS: 4.0000 mg | ORAL_TABLET | Freq: Three times a day (TID) | ORAL | 0 refills | Status: DC | PRN

## 2022-11-18 MED ORDER — CEPHALEXIN 500 MG PO CAPS: 500.0000 mg | ORAL_CAPSULE | Freq: Three times a day (TID) | ORAL | 0 refills | Status: DC

## 2022-11-20 DIAGNOSIS — M2041 Other hammer toe(s) (acquired), right foot: Secondary | ICD-10-CM

## 2022-11-20 DIAGNOSIS — M2031 Hallux varus (acquired), right foot: Secondary | ICD-10-CM

## 2022-11-25 ENCOUNTER — Ambulatory Visit (INDEPENDENT_AMBULATORY_CARE_PROVIDER_SITE_OTHER): Payer: 59

## 2022-11-25 ENCOUNTER — Ambulatory Visit (INDEPENDENT_AMBULATORY_CARE_PROVIDER_SITE_OTHER): Payer: 59 | Admitting: Podiatry

## 2022-11-25 DIAGNOSIS — M2031 Hallux varus (acquired), right foot: Secondary | ICD-10-CM

## 2022-11-25 DIAGNOSIS — Z9889 Other specified postprocedural states: Secondary | ICD-10-CM

## 2022-11-25 NOTE — Progress Notes (Signed)
Subjective:  Patient ID: Ashley [name], female    DOB: 1961-03-04,  MRN: [id number]  Chief Complaint  Patient presents with   Routine Post Op    DOS: 11/20/2022 Procedure: Right bunionectomy with fifth digit arthroplasty  62 y.o. female returns for post-op check.  Patient states that she is doing well.  Denies any other acute complaints.  Bandages clean dry and intact.  Patient is known to Dr. Al Corpus  Review of Systems: Negative except as noted in the HPI. Denies N/V/F/Ch.  Past Medical History:  Diagnosis Date   Essential tremor    GERD (gastroesophageal reflux disease)    History of cholecystectomy    Hypertension    Neuropathy    Palpitations    Seasonal allergies    Sinus infection     Current Outpatient Medications:    CALCIUM CITRATE PO, Take 1 tablet by mouth daily., Disp: , Rfl:    cephALEXin (KEFLEX) 500 MG capsule, Take 1 capsule (500 mg total) by mouth 3 (three) times daily., Disp: 30 capsule, Rfl: 0   Cholecalciferol (VITAMIN D) 50 MCG (2000 UT) tablet, Take 2,000 Units by mouth daily., Disp: , Rfl:    Cyanocobalamin 500 MCG SUBL, Place 500 mcg under the tongue daily., Disp: , Rfl:    dicyclomine (BENTYL) 10 MG capsule, Take 10 mg by mouth in the morning and at bedtime., Disp: , Rfl:    flintstones complete (FLINTSTONES) 60 MG chewable tablet, Chew 1 tablet by mouth daily., Disp: , Rfl:    fluticasone (FLONASE) 50 MCG/ACT nasal spray, Place 1 spray into the nose 2 (two) times daily. , Disp: , Rfl:    gabapentin (NEURONTIN) 300 MG capsule, Take 1 capsule (300 mg total) by mouth at bedtime., Disp: 270 capsule, Rfl: 1   hydrOXYzine (ATARAX/VISTARIL) 50 MG tablet, Take 50 mg by mouth 3 (three) times daily as needed for itching., Disp: , Rfl:    levocetirizine (XYZAL) 5 MG tablet, Take 5 mg by mouth daily., Disp: , Rfl:    montelukast (SINGULAIR) 10 MG tablet, Take 10 mg by mouth daily as needed (allergies)., Disp: , Rfl:    ondansetron (ZOFRAN) 4 MG tablet, Take 1  tablet (4 mg total) by mouth every 8 (eight) hours as needed., Disp: 20 tablet, Rfl: 0   ondansetron (ZOFRAN) 4 MG tablet, Take 1 tablet (4 mg total) by mouth every 8 (eight) hours as needed., Disp: 20 tablet, Rfl: 0   oxyCODONE-acetaminophen (PERCOCET) 5-325 MG tablet, Take 1 tablet by mouth every 4 (four) hours as needed for severe pain., Disp: 30 tablet, Rfl: 0   propranolol ER (INDERAL LA) 80 MG 24 hr capsule, TAKE 1 CAPSULE (80 MG TOTAL) BY MOUTH DAILY., Disp: 90 capsule, Rfl: 4   topiramate (TOPAMAX) 25 MG tablet, Take one tablet (25mg ) by mouth every evening., Disp: , Rfl:    traMADol (ULTRAM) 50 MG tablet, Take 1 tablet (50 mg total) by mouth every 6 (six) hours as needed., Disp: 40 tablet, Rfl: 0   traZODone (DESYREL) 50 MG tablet, , Disp: , Rfl:   Social History   Tobacco Use  Smoking Status Never  Smokeless Tobacco Never    Allergies  Allergen Reactions   Azithromycin Itching   Chocolate Hives   Mobic [Meloxicam]     Emotional, crying.   Neomycin Hives   Neosporin [Neomycin-Bacitracin Zn-Polymyx] Hives   Strawberry Extract Hives   Erythromycin Hives   Objective:  There were no vitals filed for this visit. There is no  height or weight on file to calculate BMI. Constitutional Well developed. Well nourished.  Vascular Foot warm and well perfused. Capillary refill normal to all digits.   Neurologic Normal speech. Oriented to person, place, and time. Epicritic sensation to light touch grossly present bilaterally.  Dermatologic Skin healing well without signs of infection. Skin edges well coapted without signs of infection.  Orthopedic: Tenderness to palpation noted about the surgical site.   Radiographs: 3 views of the skin trauma shoulder right foot: Good correction alignment noted previous hardware noted.  McBride bunionectomy noted Assessment:   1. Hallux malleus of right foot   2. Status post surgery    Plan:  Patient was evaluated and treated and all questions  answered.  S/p foot surgery right -Progressing as expected post-operatively. -XR: See above -WB Status: Weightbearing as tolerated in cam boot -Sutures: Intact.  No clinical signs of Deis is noted no complication noted. -Medications: None -Foot redressed.  No follow-ups on file.

## 2022-11-30 ENCOUNTER — Encounter: Payer: Self-pay | Admitting: Podiatry

## 2022-11-30 ENCOUNTER — Ambulatory Visit (INDEPENDENT_AMBULATORY_CARE_PROVIDER_SITE_OTHER): Payer: 59 | Admitting: Podiatry

## 2022-11-30 DIAGNOSIS — Z9889 Other specified postprocedural states: Secondary | ICD-10-CM

## 2022-11-30 NOTE — Progress Notes (Signed)
While covering Triage, it was requested to see a patient for a p/o dressing change who had gotten her surgical foot wet when showering over the weekend.  Her surgeon is Dr. Al Corpus.  She called on Friday (3 days ago) and got the emergency line and was instructed to change the wet dressing at home.  Her son changed it for her and it is clean and intact today.  She denies injury since the surgery.  Upon removal of dressing, the incisions are reapproximated well and sutures are intact.  Mild localized edema is noted.  No maceration or dehiscence noted.  Minimal ecchymosis and no signs of infection are noted.    The foot was redressed today with sterile gauze, kling and ace wrap.  She'll continue in her Camwalker.  Instructed patient to keep toe movements to a minimum until the sutures are removed to prevent pulling of the sutures and irritation of the scar.    F/u as scheduled with Dr. Al Corpus.

## 2022-12-02 ENCOUNTER — Other Ambulatory Visit: Payer: Self-pay | Admitting: Podiatry

## 2022-12-02 ENCOUNTER — Encounter: Payer: Self-pay | Admitting: Podiatry

## 2022-12-02 MED ORDER — OXYCODONE-ACETAMINOPHEN 5-325 MG PO TABS: 1.0000 | ORAL_TABLET | ORAL | 0 refills | Status: DC | PRN

## 2022-12-09 ENCOUNTER — Encounter: Payer: Self-pay | Admitting: Podiatry

## 2022-12-10 ENCOUNTER — Ambulatory Visit (INDEPENDENT_AMBULATORY_CARE_PROVIDER_SITE_OTHER): Payer: 59 | Admitting: Podiatry

## 2022-12-10 DIAGNOSIS — Z9889 Other specified postprocedural states: Secondary | ICD-10-CM

## 2022-12-10 DIAGNOSIS — M2031 Hallux varus (acquired), right foot: Secondary | ICD-10-CM

## 2022-12-10 NOTE — Progress Notes (Signed)
She presents today for postop visit #2 as she is status post McBride bunionectomy and a derotational arthroplasty fifth digit.  She denies fever chills nausea vomit muscle aches and pains states that is doing pretty well.  Objective: Vital signs stable alert oriented x 3 Tridural dressing once removed demonstrates no erythema cellulitis drainage or odor.  Incision sites.  Be healing very nicely sutures removed today margins remain well coapted.  Assessment: Well-healing surgical foot.  Plan: Encouraged her to continue to wrap the fifth toe with Coban and that she can get back into her Darco shoe instead of her cam boot.  Will follow-up with her in a couple of weeks at which time another set of x-rays will be necessary and hopefully will try to get her back into a regular shoe at that time

## 2022-12-31 ENCOUNTER — Encounter: Payer: 59 | Admitting: Podiatry

## 2023-01-05 ENCOUNTER — Encounter: Payer: Self-pay | Admitting: Podiatrist

## 2023-01-05 ENCOUNTER — Ambulatory Visit (INDEPENDENT_AMBULATORY_CARE_PROVIDER_SITE_OTHER): Payer: 59 | Admitting: Podiatrist

## 2023-01-05 DIAGNOSIS — Z9889 Other specified postprocedural states: Secondary | ICD-10-CM

## 2023-01-05 NOTE — Progress Notes (Signed)
Chief Complaint  Patient presents with   Routine Post Op    POV #3 DOS 11/20/2022 MCBRIDE BUNION REPAIR RT, DEROTATIONAL ARTHROPLASTY 5TH TOE RT FOOT    CX of current swelling of RT baby toe     HPI: Patient is 62 y.o. female who presents today for postop check #3.  She states overall she is doing well.  She has some swelling on the fifth digit otherwise no complaints noted.   Allergies  Allergen Reactions   Azithromycin Itching   Chocolate Hives   Mobic [Meloxicam]     Emotional, crying.   Neomycin Hives   Neosporin [Neomycin-Bacitracin Zn-Polymyx] Hives   Strawberry Extract Hives   Erythromycin Hives    Review of systems is negative except as noted in the HPI.  Denies nausea/ vomiting/ fevers/ chills or night sweats.   Denies difficulty breathing, denies calf pain or tenderness  Physical Exam  Patient is awake, alert, and oriented x 3.  In no acute distress.   Neurovascular status is intact to the right foot.  Excellent appearance of the foot is noted postoperatively.  Incision sites are well coapted and healed nicely.  Fifth digit is slightly swollen compared to the left.  Overall excellent postoperative appearance is noted.   Assessment:   ICD-10-CM   1. Status post right foot surgery  Z98.890        Plan: Discussed range of motion exercises of the first metatarsal phalangeal joint.  Recommended continued use of good supportive shoe gear and wrapping the fifth toe with either Coban wrap or a silicone type wrapping.  She will be seen back in 2 months for follow-up.  If any problems or concerns resident prior to the visit she will call.

## 2023-02-11 ENCOUNTER — Encounter: Payer: Self-pay | Admitting: Internal Medicine

## 2023-02-17 ENCOUNTER — Encounter: Payer: Self-pay | Admitting: Internal Medicine

## 2023-02-17 ENCOUNTER — Ambulatory Visit (AMBULATORY_SURGERY_CENTER): Payer: 59

## 2023-02-17 VITALS — Ht 66.0 in | Wt 253.0 lb

## 2023-02-17 DIAGNOSIS — Z1211 Encounter for screening for malignant neoplasm of colon: Secondary | ICD-10-CM

## 2023-02-17 MED ORDER — NA SULFATE-K SULFATE-MG SULF 17.5-3.13-1.6 GM/177ML PO SOLN: 1.0000 | Freq: Once | ORAL | 0 refills | Status: AC

## 2023-02-17 NOTE — Progress Notes (Signed)

## 2023-03-01 ENCOUNTER — Encounter: Payer: Self-pay | Admitting: Podiatry

## 2023-03-03 ENCOUNTER — Ambulatory Visit: Payer: 59 | Admitting: Internal Medicine

## 2023-03-03 ENCOUNTER — Encounter: Payer: Self-pay | Admitting: Internal Medicine

## 2023-03-03 VITALS — BP 133/64 | HR 56 | Temp 97.5°F | Resp 9 | Ht 66.0 in | Wt 253.0 lb

## 2023-03-03 DIAGNOSIS — Z1211 Encounter for screening for malignant neoplasm of colon: Secondary | ICD-10-CM

## 2023-03-03 DIAGNOSIS — D123 Benign neoplasm of transverse colon: Secondary | ICD-10-CM | POA: Diagnosis not present

## 2023-03-03 MED ORDER — SODIUM CHLORIDE 0.9 % IV SOLN
500.0000 mL | Freq: Once | INTRAVENOUS | Status: DC
Start: 2023-03-03 — End: 2023-03-03

## 2023-03-03 NOTE — Patient Instructions (Addendum)
I found and removed one tiny polyp.  I will let you know pathology results and when to have another routine colonoscopy by mail and/or My Chart.  I appreciate the opportunity to care for you. Iva Boop, MD, [facility]  Handout provided on polyps.  Resume previous diet.  Continue present medications.  Repeat colonoscopy is recommended. The colonoscopy date will be determined after pathology results from today's exam become available for review.   YOU HAD AN ENDOSCOPIC PROCEDURE TODAY AT THE  ENDOSCOPY CENTER:   Refer to the procedure report that was given to you for any specific questions about what was found during the examination.  If the procedure report does not answer your questions, please call your gastroenterologist to clarify.  If you requested that your care partner not be given the details of your procedure findings, then the procedure report has been included in a sealed envelope for you to review at your convenience later.  YOU SHOULD EXPECT: Some feelings of bloating in the abdomen. Passage of more gas than usual.  Walking can help get rid of the air that was put into your GI tract during the procedure and reduce the bloating. If you had a lower endoscopy (such as a colonoscopy or flexible sigmoidoscopy) you may notice spotting of blood in your stool or on the toilet paper. If you underwent a bowel prep for your procedure, you may not have a normal bowel movement for a few days.  Please Note:  You might notice some irritation and congestion in your nose or some drainage.  This is from the oxygen used during your procedure.  There is no need for concern and it should clear up in a day or so.  SYMPTOMS TO REPORT IMMEDIATELY:  Following lower endoscopy (colonoscopy or flexible sigmoidoscopy):  Excessive amounts of blood in the stool  Significant tenderness or worsening of abdominal pains  Swelling of the abdomen that is new, acute  Fever of 100F or higher  For urgent or  emergent issues, a gastroenterologist can be reached at any hour by calling [phone]. Do not use MyChart messaging for urgent concerns.    DIET:  We do recommend a small meal at first, but then you may proceed to your regular diet.  Drink plenty of fluids but you should avoid alcoholic beverages for 24 hours.  ACTIVITY:  You should plan to take it easy for the rest of today and you should NOT DRIVE or use heavy machinery until tomorrow (because of the sedation medicines used during the test).    FOLLOW UP: Our staff will call the number listed on your records the next business day following your procedure.  We will call around 7:15- 8:00 am to check on you and address any questions or concerns that you may have regarding the information given to you following your procedure. If we do not reach you, we will leave a message.     If any biopsies were taken you will be contacted by phone or by letter within the next 1-3 weeks.  Please call us at [phone] if you have not heard about the biopsies in 3 weeks.    SIGNATURES/CONFIDENTIALITY: You and/or your care partner have signed paperwork which will be entered into your electronic medical record.  These signatures attest to the fact that that the information above on your After Visit Summary has been reviewed and is understood.  Full responsibility of the confidentiality of this discharge information lies with you and/or  your care-partner.

## 2023-03-03 NOTE — Op Note (Signed)
[facility] Patient Name: Artesia Berkey Procedure Date: 03/03/2023 3:22 PM MRN: [id number] Endoscopist: Iva Boop , MD, [id number] Age: 62 Referring MD:  Date of Birth: 01-19-1961 Gender: Female Account #: [id number] Procedure:                Colonoscopy Indications:              Screening for colorectal malignant neoplasm, Last                            colonoscopy: 2014 Medicines:                Monitored Anesthesia Care Procedure:                Pre-Anesthesia Assessment:                           - Prior to the procedure, a History and Physical                            was performed, and patient medications and                            allergies were reviewed. The patient's tolerance of                            previous anesthesia was also reviewed. The risks                            and benefits of the procedure and the sedation                            options and risks were discussed with the patient.                            All questions were answered, and informed consent                            was obtained. Prior Anticoagulants: The patient has                            taken no anticoagulant or antiplatelet agents. ASA                            Grade Assessment: III - A patient with severe                            systemic disease. After reviewing the risks and                            benefits, the patient was deemed in satisfactory                            condition to undergo the procedure.  After obtaining informed consent, the colonoscope                            was passed under direct vision. Throughout the                            procedure, the patient's blood pressure, pulse, and                            oxygen saturations were monitored continuously. The                            CF [id number] #[id number] was introduced through the anus                            and advanced to the the cecum,  identified by                            appendiceal orifice and ileocecal valve. The                            colonoscopy was performed without difficulty. The                            patient tolerated the procedure well. The quality                            of the bowel preparation was good. The ileocecal                            valve, appendiceal orifice, and rectum were                            photographed. The bowel preparation used was SUPREP                            via split dose instruction. Scope In: 3:26:50 PM Scope Out: 3:38:43 PM Scope Withdrawal Time: 0 hours 9 minutes 13 seconds  Total Procedure Duration: 0 hours 11 minutes 53 seconds  Findings:                 The perianal and digital rectal examinations were                            normal.                           A diminutive polyp was found in the proximal                            transverse colon. The polyp was sessile. The polyp                            was removed with a cold snare. Resection and  retrieval were complete. Verification of patient                            identification for the specimen was done. Estimated                            blood loss was minimal.                           The exam was otherwise without abnormality on                            direct and retroflexion views. Complications:            No immediate complications. Estimated Blood Loss:     Estimated blood loss was minimal. Impression:               - One diminutive polyp in the proximal transverse                            colon, removed with a cold snare. Resected and                            retrieved.                           - The examination was otherwise normal on direct                            and retroflexion views. Recommendation:           - Patient has a contact number available for                            emergencies. The signs and symptoms of potential                             delayed complications were discussed with the                            patient. Return to normal activities tomorrow.                            Written discharge instructions were provided to the                            patient.                           - Resume previous diet.                           - Continue present medications.                           - Repeat colonoscopy is recommended. The  colonoscopy date will be determined after pathology                            results from today's exam become available for                            review. Iva Boop, MD 03/03/2023 3:43:44 PM This report has been signed electronically.

## 2023-03-03 NOTE — Progress Notes (Signed)
Called to room to assist during endoscopic procedure.  Patient ID and intended procedure confirmed with present staff. Received instructions for my participation in the procedure from the performing physician.  

## 2023-03-03 NOTE — Progress Notes (Signed)
Pt's states no medical or surgical changes since previsit or office visit. 

## 2023-03-03 NOTE — Progress Notes (Signed)
[facility] History and Physical   Primary Care Physician:  April Manson, NP   Reason for Procedure:   CRCA screen  Plan:    Colonoscopy      HPI: Ashley [name] is a 62 y.o. female for screening exam   Past Medical History:  Diagnosis Date   Allergy    Arthritis    Essential tremor    GERD (gastroesophageal reflux disease)    History of cholecystectomy    Hypertension    Neuropathy    Palpitations    Seasonal allergies    Sinus infection    Sleep apnea     Past Surgical History:  Procedure Laterality Date   BACK SURGERY  2022   CARPAL TUNNEL RELEASE Right 2005   left 2011   CESAREAN SECTION  1989   CHOLECYSTECTOMY  2008   CORRECTION OF HAMMER TOE REPAIR AND BUNION CORRECTION  2018   both feet   Hammer Toe Repair Bilateral 09/11/2016   #2 Toe Bilateral    HEEL SPUR SURGERY Left    history of neuroplasty decompression median nerve at carpal tunnel     LAPAROSCOPIC GASTRIC SLEEVE RESECTION WITH HIATAL HERNIA REPAIR  04/09/2015   Procedure: LAPAROSCOPIC GASTRIC SLEEVE RESECTION WITH HIATAL HERNIA REPAIR;  Surgeon: Glenna Fellows, MD;  Location: [facility];  Service: General;;   thumb joint replacement Bilateral    R- 03/02/17 & L- 07/21/16   UPPER GI ENDOSCOPY  04/09/2015   Procedure: UPPER GI ENDOSCOPY;  Surgeon: Glenna Fellows, MD;  Location: [facility];  Service: General;;    Prior to Admission medications   Medication Sig Start Date End Date Taking? Authorizing Provider  CALCIUM CITRATE PO Take 1 tablet by mouth daily.    [provider]  Cholecalciferol (VITAMIN D) 50 MCG (2000 UT) tablet Take 2,000 Units by mouth daily.    [provider]  Cyanocobalamin 500 MCG SUBL Place 500 mcg under the tongue daily.    [provider]  dicyclomine (BENTYL) 10 MG capsule Take 10 mg by mouth in the morning and at bedtime.    [provider]  flintstones complete (FLINTSTONES) 60 MG chewable tablet Chew 1 tablet by mouth  daily.    [provider]  fluticasone (FLONASE) 50 MCG/ACT nasal spray Place 1 spray into the nose 2 (two) times daily.  06/19/14 05/01/21  [provider]  hydrOXYzine (ATARAX/VISTARIL) 50 MG tablet Take 50 mg by mouth 3 (three) times daily as needed for itching. 10/28/20   [provider]  levocetirizine (XYZAL) 5 MG tablet Take 5 mg by mouth daily. 06/17/20   [provider]  montelukast (SINGULAIR) 10 MG tablet Take 10 mg by mouth daily as needed (allergies).    [provider]  Multiple Vitamins-Minerals (HAIR SKIN & NAILS PO) Take by mouth.    [provider]  propranolol ER (INDERAL LA) 80 MG 24 hr capsule TAKE 1 CAPSULE (80 MG TOTAL) BY MOUTH DAILY. 09/13/17   Anson Fret, MD  topiramate (TOPAMAX) 25 MG tablet Take one tablet (25mg ) by mouth every evening. 08/19/22   [provider]  traZODone (DESYREL) 50 MG tablet  02/11/22   [provider]  Turmeric (QC TUMERIC COMPLEX PO) Take by mouth.    [provider]    Current Outpatient Medications  Medication Sig Dispense Refill   CALCIUM CITRATE PO Take 1 tablet by mouth daily.     Cholecalciferol (VITAMIN D) 50 MCG (2000 UT) tablet Take  2,000 Units by mouth daily.     Cyanocobalamin 500 MCG SUBL Place 500 mcg under the tongue daily.     dicyclomine (BENTYL) 10 MG capsule Take 10 mg by mouth in the morning and at bedtime.     flintstones complete (FLINTSTONES) 60 MG chewable tablet Chew 1 tablet by mouth daily.     fluticasone (FLONASE) 50 MCG/ACT nasal spray Place 1 spray into the nose 2 (two) times daily.      hydrOXYzine (ATARAX/VISTARIL) 50 MG tablet Take 50 mg by mouth 3 (three) times daily as needed for itching.     levocetirizine (XYZAL) 5 MG tablet Take 5 mg by mouth daily.     montelukast (SINGULAIR) 10 MG tablet Take 10 mg by mouth daily as needed (allergies).     Multiple Vitamins-Minerals (HAIR SKIN & NAILS PO) Take by mouth.     propranolol ER  (INDERAL LA) 80 MG 24 hr capsule TAKE 1 CAPSULE (80 MG TOTAL) BY MOUTH DAILY. 90 capsule 4   topiramate (TOPAMAX) 25 MG tablet Take one tablet (25mg ) by mouth every evening.     traZODone (DESYREL) 50 MG tablet      Turmeric (QC TUMERIC COMPLEX PO) Take by mouth.     Current Facility-Administered Medications  Medication Dose Route Frequency Provider Last Rate Last Admin   0.9 %  sodium chloride infusion  500 mL Intravenous Once Iva Boop, MD        Allergies as of 03/03/2023 - Review Complete 03/03/2023  Allergen Reaction Noted   Azithromycin Itching 03/29/2015   Chocolate Hives 03/22/2013   Mobic [meloxicam]  12/28/2012   Neomycin Hives 03/22/2013   Neosporin [neomycin-bacitracin zn-polymyx] Hives 03/22/2013   Strawberry extract Hives 03/22/2013   Erythromycin Hives 01/27/2013    Family History  Problem Relation Age of Onset   Colon cancer Neg Hx    Esophageal cancer Neg Hx    Rectal cancer Neg Hx    Stomach cancer Neg Hx    Colon polyps Neg Hx     Social History   Socioeconomic History   Marital status: Legally Separated    Spouse name: Colon Branch    Number of children: 3   Years of education: 12+   Highest education level: Not on file  Occupational History   Occupation: Not currently working post-op    Employer: [employer]  Tobacco Use   Smoking status: Never   Smokeless tobacco: Never  Vaping Use   Vaping status: Never Used  Substance and Sexual Activity   Alcohol use: No   Drug use: No   Sexual activity: Not Currently  Other Topics Concern   Not on file  Social History Narrative   Patient is Married to [place], currently separated    Patient works full-time.   Patient has 3 children.    Patient has some college.    Right-handed      Social Determinants of Health   Financial Resource Strain: Low Risk  (02/01/2023)   Received from [facility]   Overall Financial Resource Strain (CARDIA)    Difficulty of Paying Living Expenses: Not very hard   Food Insecurity: No Food Insecurity (02/01/2023)   Received from [facility]   Hunger Vital Sign    Worried About Running Out of Food in the Last Year: Never true    Ran Out of Food in the Last Year: Never true  Transportation Needs: No Transportation Needs (02/01/2023)   Received from [facility] -  Administrator, Civil Service (Medical): No    Lack of Transportation (Non-Medical): No  Physical Activity: Insufficiently Active (02/01/2023)   Received from [facility]   Exercise Vital Sign    Days of Exercise per Week: 2 days    Minutes of Exercise per Session: 10 min  Stress: No Stress Concern Present (02/01/2023)   Received from [facility] - Occupational Stress Questionnaire    Feeling of Stress : Only a little  Social Connections: Moderately Integrated (02/01/2023)   Received from [facility]   Social Network    How would you rate your social network (family, work, friends)?: Adequate participation with social networks  Intimate Partner Violence: Not At Risk (02/01/2023)   Received from [facility]   HITS    Over the last 12 months how often did your partner physically hurt you?: 1    Over the last 12 months how often did your partner insult you or talk down to you?: 1    Over the last 12 months how often did your partner threaten you with physical harm?: 1    Over the last 12 months how often did your partner scream or curse at you?: 1    Review of Systems:  All other review of systems negative except as mentioned in the HPI.  Physical Exam: Vital signs BP (!) 161/109   Pulse (!) 57   Temp (!) 97.5 F (36.4 C) (Other (Comment)) Comment (Src): forehead  Ht 5\' 6"  (1.676 m)   Wt 253 lb (114.8 kg)   SpO2 99%   BMI 40.84 kg/m   General:   Alert,  Well-developed, well-nourished, pleasant and cooperative in NAD Lungs:  Clear throughout to auscultation.   Heart:  Regular rate and rhythm; no murmurs,  clicks, rubs,  or gallops. Abdomen:  Soft, nontender and nondistended. Normal bowel sounds.   Neuro/Psych:  Alert and cooperative. Normal mood and affect. A and O x 3   @Ashley [name]  Sena Slate, MD, [facility] [phone] (pager) 03/03/2023 2:50 PM@

## 2023-03-03 NOTE — Progress Notes (Signed)
Vssmnad trans to pacu 

## 2023-03-04 ENCOUNTER — Other Ambulatory Visit: Payer: Self-pay | Admitting: Podiatry

## 2023-03-04 ENCOUNTER — Ambulatory Visit: Payer: 59 | Admitting: Podiatry

## 2023-03-04 ENCOUNTER — Encounter: Payer: Self-pay | Admitting: Podiatry

## 2023-03-04 ENCOUNTER — Ambulatory Visit: Payer: 59

## 2023-03-04 ENCOUNTER — Telehealth: Payer: Self-pay | Admitting: *Deleted

## 2023-03-04 VITALS — BP 154/58 | HR 55 | Resp 18 | Ht 66.0 in | Wt 253.0 lb

## 2023-03-04 DIAGNOSIS — Z9889 Other specified postprocedural states: Secondary | ICD-10-CM

## 2023-03-04 DIAGNOSIS — M2031 Hallux varus (acquired), right foot: Secondary | ICD-10-CM

## 2023-03-04 NOTE — Telephone Encounter (Signed)
No answer on  follow up call. Left message.   

## 2023-03-04 NOTE — Progress Notes (Signed)
He presents today date of surgery 11/20/2022.  He is status post bunion repair) with derotational arthroplasty fifth digit right foot and fusion of the interphalangeal joint of the hallux.  She states that she is doing well her foot feels tiredness is doing pretty well.  Objective: Vital signs are stable alert oriented x 3 Erythema cellulitis cellulitis of mild edema around the fifth digit of the right foot in the first metatarsal space right foot.  Radiographs taken today demonstrate complete consolidation of the fusion sites.  Well-healing osteotomies.  Assessment: Well-healing surgical foot bilateral.  Plan: Follow-up with her on an as-needed basis.

## 2023-03-08 LAB — SURGICAL PATHOLOGY

## 2023-03-15 ENCOUNTER — Encounter: Payer: Self-pay | Admitting: Internal Medicine

## 2023-03-15 DIAGNOSIS — Z860101 Personal history of adenomatous and serrated colon polyps: Secondary | ICD-10-CM

## 2023-03-15 HISTORY — DX: Personal history of adenomatous and serrated colon polyps: Z86.0101

## 2023-04-04 ENCOUNTER — Encounter: Payer: Self-pay | Admitting: Orthopaedic Surgery

## 2023-06-02 ENCOUNTER — Encounter (INDEPENDENT_AMBULATORY_CARE_PROVIDER_SITE_OTHER): Payer: Self-pay

## 2023-06-02 ENCOUNTER — Other Ambulatory Visit: Payer: Self-pay | Admitting: Medical Genetics

## 2023-06-04 ENCOUNTER — Other Ambulatory Visit (HOSPITAL_COMMUNITY): Payer: Self-pay

## 2023-06-09 ENCOUNTER — Other Ambulatory Visit (HOSPITAL_COMMUNITY): Payer: Self-pay

## 2023-06-09 ENCOUNTER — Other Ambulatory Visit (HOSPITAL_COMMUNITY)
Admission: RE | Admit: 2023-06-09 | Discharge: 2023-06-09 | Disposition: A | Discharge status: Home / Self Care | Payer: Self-pay | Source: Ambulatory Visit | Attending: Oncology | Admitting: Oncology

## 2023-06-18 LAB — GENECONNECT MOLECULAR SCREEN: Genetic Analysis Overall Interpretation: NEGATIVE

## 2024-03-27 ENCOUNTER — Encounter: Payer: Self-pay | Admitting: Radiology
